# Patient Record
Sex: Female | Born: 1960 | Race: Black or African American | Hispanic: No | Marital: Single | Smoking: Former smoker
Health system: Southern US, Community
[De-identification: ages and names within clinical notes are randomized; demographics above are authoritative.]

## PROBLEM LIST (undated history)

## (undated) ENCOUNTER — Ambulatory Visit (HOSPITAL_COMMUNITY): Admission: EM | Payer: No Typology Code available for payment source

## (undated) DIAGNOSIS — N3281 Overactive bladder: Secondary | ICD-10-CM

## (undated) DIAGNOSIS — I10 Essential (primary) hypertension: Secondary | ICD-10-CM

## (undated) DIAGNOSIS — E119 Type 2 diabetes mellitus without complications: Secondary | ICD-10-CM

## (undated) DIAGNOSIS — M069 Rheumatoid arthritis, unspecified: Secondary | ICD-10-CM

## (undated) DIAGNOSIS — K219 Gastro-esophageal reflux disease without esophagitis: Secondary | ICD-10-CM

## (undated) DIAGNOSIS — K5900 Constipation, unspecified: Secondary | ICD-10-CM

## (undated) DIAGNOSIS — G5603 Carpal tunnel syndrome, bilateral upper limbs: Secondary | ICD-10-CM

## (undated) DIAGNOSIS — E7800 Pure hypercholesterolemia, unspecified: Secondary | ICD-10-CM

## (undated) HISTORY — PX: ABDOMINAL HYSTERECTOMY: SHX81

## (undated) HISTORY — DX: Type 2 diabetes mellitus without complications: E11.9

## (undated) HISTORY — DX: Rheumatoid arthritis, unspecified: M06.9

## (undated) HISTORY — DX: Carpal tunnel syndrome, bilateral upper limbs: G56.03

## (undated) HISTORY — PX: ANKLE FRACTURE SURGERY: SHX122

---

## 1997-08-12 ENCOUNTER — Emergency Department (HOSPITAL_COMMUNITY): Admission: EM | Admit: 1997-08-12 | Discharge: 1997-08-12 | Payer: Self-pay | Admitting: Emergency Medicine

## 1997-08-13 ENCOUNTER — Emergency Department (HOSPITAL_COMMUNITY): Admission: EM | Admit: 1997-08-13 | Discharge: 1997-08-13 | Payer: Self-pay | Admitting: Internal Medicine

## 1997-09-27 ENCOUNTER — Emergency Department (HOSPITAL_COMMUNITY): Admission: EM | Admit: 1997-09-27 | Discharge: 1997-09-27 | Payer: Self-pay | Admitting: Emergency Medicine

## 1997-10-29 ENCOUNTER — Emergency Department (HOSPITAL_COMMUNITY): Admission: EM | Admit: 1997-10-29 | Discharge: 1997-10-29 | Payer: Self-pay | Admitting: Emergency Medicine

## 1998-11-20 ENCOUNTER — Emergency Department (HOSPITAL_COMMUNITY): Admission: EM | Admit: 1998-11-20 | Discharge: 1998-11-20 | Payer: Self-pay | Admitting: Emergency Medicine

## 1999-01-18 ENCOUNTER — Emergency Department (HOSPITAL_COMMUNITY): Admission: EM | Admit: 1999-01-18 | Discharge: 1999-01-18 | Payer: Self-pay | Admitting: Emergency Medicine

## 1999-02-20 ENCOUNTER — Ambulatory Visit (HOSPITAL_COMMUNITY): Admission: RE | Admit: 1999-02-20 | Discharge: 1999-02-20 | Payer: Self-pay | Admitting: Internal Medicine

## 1999-02-20 ENCOUNTER — Encounter: Payer: Self-pay | Admitting: Internal Medicine

## 1999-09-29 ENCOUNTER — Emergency Department (HOSPITAL_COMMUNITY): Admission: EM | Admit: 1999-09-29 | Discharge: 1999-09-29 | Payer: Self-pay | Admitting: Emergency Medicine

## 1999-12-22 ENCOUNTER — Emergency Department (HOSPITAL_COMMUNITY): Admission: EM | Admit: 1999-12-22 | Discharge: 1999-12-22 | Payer: Self-pay | Admitting: Emergency Medicine

## 2000-01-06 ENCOUNTER — Ambulatory Visit (HOSPITAL_COMMUNITY): Admission: RE | Admit: 2000-01-06 | Discharge: 2000-01-06 | Payer: Self-pay | Admitting: *Deleted

## 2000-01-26 ENCOUNTER — Ambulatory Visit (HOSPITAL_COMMUNITY): Admission: RE | Admit: 2000-01-26 | Discharge: 2000-01-26 | Payer: Self-pay | Admitting: *Deleted

## 2000-05-18 ENCOUNTER — Emergency Department (HOSPITAL_COMMUNITY): Admission: EM | Admit: 2000-05-18 | Discharge: 2000-05-18 | Payer: Self-pay | Admitting: Emergency Medicine

## 2000-06-14 ENCOUNTER — Emergency Department (HOSPITAL_COMMUNITY): Admission: EM | Admit: 2000-06-14 | Discharge: 2000-06-14 | Payer: Self-pay | Admitting: Emergency Medicine

## 2002-02-04 ENCOUNTER — Emergency Department (HOSPITAL_COMMUNITY): Admission: EM | Admit: 2002-02-04 | Discharge: 2002-02-04 | Payer: Self-pay | Admitting: Emergency Medicine

## 2002-06-19 ENCOUNTER — Emergency Department (HOSPITAL_COMMUNITY): Admission: EM | Admit: 2002-06-19 | Discharge: 2002-06-19 | Payer: Self-pay

## 2002-09-24 ENCOUNTER — Emergency Department (HOSPITAL_COMMUNITY): Admission: EM | Admit: 2002-09-24 | Discharge: 2002-09-24 | Payer: Self-pay | Admitting: Emergency Medicine

## 2004-03-08 DIAGNOSIS — E8941 Symptomatic postprocedural ovarian failure: Secondary | ICD-10-CM

## 2004-03-08 HISTORY — DX: Symptomatic postprocedural ovarian failure: E89.41

## 2004-05-18 ENCOUNTER — Ambulatory Visit: Payer: Self-pay | Admitting: Nurse Practitioner

## 2004-05-19 ENCOUNTER — Ambulatory Visit: Payer: Self-pay | Admitting: *Deleted

## 2004-06-01 ENCOUNTER — Ambulatory Visit: Payer: Self-pay | Admitting: Nurse Practitioner

## 2004-06-04 ENCOUNTER — Ambulatory Visit (HOSPITAL_COMMUNITY): Admission: RE | Admit: 2004-06-04 | Discharge: 2004-06-04 | Payer: Self-pay | Admitting: Internal Medicine

## 2004-06-04 ENCOUNTER — Encounter: Payer: Self-pay | Admitting: Cardiology

## 2004-06-04 ENCOUNTER — Ambulatory Visit: Payer: Self-pay | Admitting: Internal Medicine

## 2004-06-05 ENCOUNTER — Ambulatory Visit (HOSPITAL_COMMUNITY): Admission: RE | Admit: 2004-06-05 | Discharge: 2004-06-05 | Payer: Self-pay | Admitting: Family Medicine

## 2004-06-08 ENCOUNTER — Encounter (HOSPITAL_COMMUNITY): Admission: RE | Admit: 2004-06-08 | Discharge: 2004-09-06 | Payer: Self-pay | Admitting: Nurse Practitioner

## 2004-06-12 ENCOUNTER — Ambulatory Visit: Payer: Self-pay | Admitting: Hematology & Oncology

## 2004-06-20 ENCOUNTER — Emergency Department (HOSPITAL_COMMUNITY): Admission: EM | Admit: 2004-06-20 | Discharge: 2004-06-20 | Payer: Self-pay | Admitting: Emergency Medicine

## 2004-06-22 ENCOUNTER — Ambulatory Visit: Payer: Self-pay | Admitting: Nurse Practitioner

## 2004-06-22 ENCOUNTER — Ambulatory Visit (HOSPITAL_COMMUNITY): Admission: RE | Admit: 2004-06-22 | Discharge: 2004-06-22 | Payer: Self-pay | Admitting: Internal Medicine

## 2004-08-11 ENCOUNTER — Ambulatory Visit: Payer: Self-pay | Admitting: Hematology & Oncology

## 2004-09-03 ENCOUNTER — Ambulatory Visit: Payer: Self-pay | Admitting: Nurse Practitioner

## 2004-09-26 ENCOUNTER — Emergency Department (HOSPITAL_COMMUNITY): Admission: EM | Admit: 2004-09-26 | Discharge: 2004-09-26 | Payer: Self-pay | Admitting: Emergency Medicine

## 2004-09-28 ENCOUNTER — Emergency Department (HOSPITAL_COMMUNITY): Admission: EM | Admit: 2004-09-28 | Discharge: 2004-09-28 | Payer: Self-pay | Admitting: Emergency Medicine

## 2004-10-03 ENCOUNTER — Emergency Department (HOSPITAL_COMMUNITY): Admission: EM | Admit: 2004-10-03 | Discharge: 2004-10-03 | Payer: Self-pay | Admitting: Emergency Medicine

## 2004-10-05 ENCOUNTER — Ambulatory Visit: Payer: Self-pay | Admitting: Family Medicine

## 2004-10-07 ENCOUNTER — Ambulatory Visit: Payer: Self-pay | Admitting: Family Medicine

## 2004-10-14 ENCOUNTER — Emergency Department (HOSPITAL_COMMUNITY): Admission: EM | Admit: 2004-10-14 | Discharge: 2004-10-14 | Payer: Self-pay | Admitting: Emergency Medicine

## 2004-10-15 ENCOUNTER — Ambulatory Visit: Payer: Self-pay | Admitting: Nurse Practitioner

## 2004-10-27 ENCOUNTER — Ambulatory Visit: Payer: Self-pay | Admitting: Obstetrics and Gynecology

## 2004-10-29 ENCOUNTER — Ambulatory Visit: Payer: Self-pay | Admitting: Nurse Practitioner

## 2004-11-03 ENCOUNTER — Encounter (INDEPENDENT_AMBULATORY_CARE_PROVIDER_SITE_OTHER): Payer: Self-pay | Admitting: Specialist

## 2004-11-03 ENCOUNTER — Other Ambulatory Visit: Admission: RE | Admit: 2004-11-03 | Discharge: 2004-11-03 | Payer: Self-pay | Admitting: Obstetrics and Gynecology

## 2004-11-03 ENCOUNTER — Ambulatory Visit: Payer: Self-pay | Admitting: *Deleted

## 2004-11-11 ENCOUNTER — Ambulatory Visit: Payer: Self-pay | Admitting: Hematology & Oncology

## 2004-11-24 ENCOUNTER — Ambulatory Visit: Payer: Self-pay | Admitting: Obstetrics & Gynecology

## 2005-01-18 ENCOUNTER — Encounter (INDEPENDENT_AMBULATORY_CARE_PROVIDER_SITE_OTHER): Payer: Self-pay | Admitting: *Deleted

## 2005-01-18 ENCOUNTER — Inpatient Hospital Stay (HOSPITAL_COMMUNITY): Admission: RE | Admit: 2005-01-18 | Discharge: 2005-01-20 | Payer: Self-pay | Admitting: Obstetrics & Gynecology

## 2005-01-18 ENCOUNTER — Ambulatory Visit: Payer: Self-pay | Admitting: Obstetrics & Gynecology

## 2005-01-26 ENCOUNTER — Ambulatory Visit: Payer: Self-pay | Admitting: Obstetrics & Gynecology

## 2005-02-10 ENCOUNTER — Ambulatory Visit: Payer: Self-pay | Admitting: Hematology & Oncology

## 2005-02-16 ENCOUNTER — Ambulatory Visit: Payer: Self-pay | Admitting: Obstetrics & Gynecology

## 2005-02-25 ENCOUNTER — Ambulatory Visit: Payer: Self-pay | Admitting: Family Medicine

## 2005-02-28 ENCOUNTER — Emergency Department (HOSPITAL_COMMUNITY): Admission: EM | Admit: 2005-02-28 | Discharge: 2005-02-28 | Payer: Self-pay | Admitting: *Deleted

## 2005-04-01 ENCOUNTER — Ambulatory Visit: Payer: Self-pay | Admitting: Obstetrics and Gynecology

## 2005-04-12 ENCOUNTER — Emergency Department (HOSPITAL_COMMUNITY): Admission: EM | Admit: 2005-04-12 | Discharge: 2005-04-12 | Payer: Self-pay | Admitting: Emergency Medicine

## 2005-05-05 ENCOUNTER — Ambulatory Visit: Payer: Self-pay | Admitting: Nurse Practitioner

## 2005-05-07 ENCOUNTER — Ambulatory Visit (HOSPITAL_COMMUNITY): Admission: RE | Admit: 2005-05-07 | Discharge: 2005-05-07 | Payer: Self-pay | Admitting: Internal Medicine

## 2005-05-25 ENCOUNTER — Ambulatory Visit: Payer: Self-pay | Admitting: Hematology & Oncology

## 2005-06-03 ENCOUNTER — Ambulatory Visit: Payer: Self-pay | Admitting: *Deleted

## 2005-06-17 ENCOUNTER — Ambulatory Visit: Payer: Self-pay | Admitting: Obstetrics and Gynecology

## 2005-07-23 ENCOUNTER — Ambulatory Visit: Payer: Self-pay | Admitting: Nurse Practitioner

## 2005-09-06 ENCOUNTER — Ambulatory Visit: Payer: Self-pay | Admitting: Nurse Practitioner

## 2005-11-25 ENCOUNTER — Ambulatory Visit: Payer: Self-pay | Admitting: Hematology & Oncology

## 2005-12-23 ENCOUNTER — Observation Stay (HOSPITAL_COMMUNITY): Admission: EM | Admit: 2005-12-23 | Discharge: 2005-12-23 | Payer: Self-pay | Admitting: Emergency Medicine

## 2006-05-02 ENCOUNTER — Ambulatory Visit: Payer: Self-pay | Admitting: Nurse Practitioner

## 2006-05-13 ENCOUNTER — Encounter: Admission: RE | Admit: 2006-05-13 | Discharge: 2006-05-13 | Payer: Self-pay | Admitting: Nurse Practitioner

## 2006-05-16 ENCOUNTER — Emergency Department (HOSPITAL_COMMUNITY): Admission: EM | Admit: 2006-05-16 | Discharge: 2006-05-16 | Payer: Self-pay | Admitting: Emergency Medicine

## 2006-08-11 ENCOUNTER — Ambulatory Visit: Payer: Self-pay | Admitting: Internal Medicine

## 2006-08-19 ENCOUNTER — Ambulatory Visit: Payer: Self-pay | Admitting: Internal Medicine

## 2006-11-23 ENCOUNTER — Encounter (INDEPENDENT_AMBULATORY_CARE_PROVIDER_SITE_OTHER): Payer: Self-pay | Admitting: *Deleted

## 2006-12-25 ENCOUNTER — Emergency Department (HOSPITAL_COMMUNITY): Admission: EM | Admit: 2006-12-25 | Discharge: 2006-12-25 | Payer: Self-pay | Admitting: Emergency Medicine

## 2007-01-31 ENCOUNTER — Encounter (INDEPENDENT_AMBULATORY_CARE_PROVIDER_SITE_OTHER): Payer: Self-pay | Admitting: Nurse Practitioner

## 2007-01-31 ENCOUNTER — Ambulatory Visit: Payer: Self-pay | Admitting: Family Medicine

## 2007-01-31 LAB — CONVERTED CEMR LAB
AST: 22 units/L (ref 0–37)
Albumin: 4.7 g/dL (ref 3.5–5.2)
Alkaline Phosphatase: 63 units/L (ref 39–117)
Basophils Relative: 0 % (ref 0–1)
Eosinophils Absolute: 0.1 10*3/uL — ABNORMAL LOW (ref 0.2–0.7)
LDL Cholesterol: 99 mg/dL (ref 0–99)
MCHC: 33.6 g/dL (ref 30.0–36.0)
MCV: 89.1 fL (ref 78.0–100.0)
Neutrophils Relative %: 67 % (ref 43–77)
Platelets: 295 10*3/uL (ref 150–400)
Potassium: 4.3 meq/L (ref 3.5–5.3)
Sodium: 141 meq/L (ref 135–145)
TSH: 1.035 microintl units/mL (ref 0.350–5.50)
Total Protein: 8.1 g/dL (ref 6.0–8.3)
WBC: 6.2 10*3/uL (ref 4.0–10.5)

## 2007-04-16 ENCOUNTER — Emergency Department (HOSPITAL_COMMUNITY): Admission: EM | Admit: 2007-04-16 | Discharge: 2007-04-16 | Payer: Self-pay | Admitting: Emergency Medicine

## 2007-04-21 ENCOUNTER — Ambulatory Visit: Payer: Self-pay | Admitting: Internal Medicine

## 2007-05-26 ENCOUNTER — Ambulatory Visit: Payer: Self-pay | Admitting: Family Medicine

## 2007-08-12 ENCOUNTER — Emergency Department (HOSPITAL_COMMUNITY): Admission: EM | Admit: 2007-08-12 | Discharge: 2007-08-12 | Payer: Self-pay | Admitting: Emergency Medicine

## 2007-08-31 ENCOUNTER — Ambulatory Visit: Payer: Self-pay | Admitting: Internal Medicine

## 2007-09-15 ENCOUNTER — Ambulatory Visit: Payer: Self-pay | Admitting: Internal Medicine

## 2007-10-27 ENCOUNTER — Ambulatory Visit: Payer: Self-pay | Admitting: Internal Medicine

## 2007-10-27 ENCOUNTER — Encounter: Payer: Self-pay | Admitting: Family Medicine

## 2007-11-02 ENCOUNTER — Ambulatory Visit (HOSPITAL_COMMUNITY): Admission: RE | Admit: 2007-11-02 | Discharge: 2007-11-02 | Payer: Self-pay | Admitting: Family Medicine

## 2007-11-22 ENCOUNTER — Ambulatory Visit: Payer: Self-pay | Admitting: Internal Medicine

## 2007-12-05 ENCOUNTER — Ambulatory Visit (HOSPITAL_COMMUNITY): Admission: RE | Admit: 2007-12-05 | Discharge: 2007-12-05 | Payer: Self-pay | Admitting: Internal Medicine

## 2007-12-29 ENCOUNTER — Ambulatory Visit: Payer: Self-pay | Admitting: Family Medicine

## 2008-01-01 ENCOUNTER — Emergency Department (HOSPITAL_COMMUNITY): Admission: EM | Admit: 2008-01-01 | Discharge: 2008-01-01 | Payer: Self-pay | Admitting: Emergency Medicine

## 2008-01-03 ENCOUNTER — Ambulatory Visit: Payer: Self-pay | Admitting: Internal Medicine

## 2008-01-26 ENCOUNTER — Ambulatory Visit: Payer: Self-pay | Admitting: Internal Medicine

## 2008-02-08 ENCOUNTER — Encounter: Payer: Self-pay | Admitting: Family Medicine

## 2008-02-08 ENCOUNTER — Ambulatory Visit: Payer: Self-pay | Admitting: Internal Medicine

## 2008-02-08 LAB — CONVERTED CEMR LAB
Chloride: 102 meq/L (ref 96–112)
Creatinine, Ser: 0.79 mg/dL (ref 0.40–1.20)
Potassium: 3.8 meq/L (ref 3.5–5.3)

## 2008-02-19 ENCOUNTER — Ambulatory Visit: Payer: Self-pay | Admitting: Internal Medicine

## 2008-03-15 ENCOUNTER — Emergency Department (HOSPITAL_COMMUNITY): Admission: EM | Admit: 2008-03-15 | Discharge: 2008-03-15 | Payer: Self-pay | Admitting: Emergency Medicine

## 2008-03-20 ENCOUNTER — Emergency Department (HOSPITAL_COMMUNITY): Admission: EM | Admit: 2008-03-20 | Discharge: 2008-03-20 | Payer: Self-pay | Admitting: Family Medicine

## 2008-04-30 ENCOUNTER — Emergency Department (HOSPITAL_COMMUNITY): Admission: EM | Admit: 2008-04-30 | Discharge: 2008-04-30 | Payer: Self-pay | Admitting: Emergency Medicine

## 2008-05-03 ENCOUNTER — Ambulatory Visit: Payer: Self-pay | Admitting: Family Medicine

## 2008-05-03 LAB — CONVERTED CEMR LAB
AST: 31 units/L (ref 0–37)
BUN: 11 mg/dL (ref 6–23)
Basophils Relative: 0 % (ref 0–1)
Calcium: 9.5 mg/dL (ref 8.4–10.5)
Chloride: 105 meq/L (ref 96–112)
Creatinine, Ser: 0.75 mg/dL (ref 0.40–1.20)
Glucose, Bld: 86 mg/dL (ref 70–99)
Hemoglobin: 14 g/dL (ref 12.0–15.0)
Lymphs Abs: 1.4 10*3/uL (ref 0.7–4.0)
MCHC: 34 g/dL (ref 30.0–36.0)
MCV: 90.2 fL (ref 78.0–100.0)
Monocytes Absolute: 0.3 10*3/uL (ref 0.1–1.0)
Monocytes Relative: 6 % (ref 3–12)
Neutro Abs: 3.1 10*3/uL (ref 1.7–7.7)
RBC: 4.57 M/uL (ref 3.87–5.11)
TSH: 0.476 microintl units/mL (ref 0.350–4.50)

## 2008-05-06 ENCOUNTER — Ambulatory Visit (HOSPITAL_COMMUNITY): Admission: RE | Admit: 2008-05-06 | Discharge: 2008-05-06 | Payer: Self-pay | Admitting: Family Medicine

## 2008-05-17 ENCOUNTER — Ambulatory Visit: Payer: Self-pay | Admitting: Family Medicine

## 2008-05-21 ENCOUNTER — Encounter: Payer: Self-pay | Admitting: Family Medicine

## 2009-01-10 ENCOUNTER — Ambulatory Visit (HOSPITAL_COMMUNITY): Admission: RE | Admit: 2009-01-10 | Discharge: 2009-01-10 | Payer: Self-pay | Admitting: Internal Medicine

## 2009-02-19 ENCOUNTER — Ambulatory Visit: Payer: Self-pay | Admitting: Nurse Practitioner

## 2009-02-19 DIAGNOSIS — K219 Gastro-esophageal reflux disease without esophagitis: Secondary | ICD-10-CM | POA: Insufficient documentation

## 2009-02-19 DIAGNOSIS — R350 Frequency of micturition: Secondary | ICD-10-CM | POA: Insufficient documentation

## 2009-02-19 DIAGNOSIS — I10 Essential (primary) hypertension: Secondary | ICD-10-CM | POA: Insufficient documentation

## 2009-02-19 DIAGNOSIS — L293 Anogenital pruritus, unspecified: Secondary | ICD-10-CM | POA: Insufficient documentation

## 2009-02-19 HISTORY — DX: Frequency of micturition: R35.0

## 2009-02-19 LAB — CONVERTED CEMR LAB
ALT: 12 units/L (ref 0–35)
Alkaline Phosphatase: 46 units/L (ref 39–117)
Basophils Absolute: 0 10*3/uL (ref 0.0–0.1)
Basophils Relative: 0 % (ref 0–1)
Blood Glucose, Fingerstick: 85
CO2: 22 meq/L (ref 19–32)
Creatinine, Ser: 0.97 mg/dL (ref 0.40–1.20)
Eosinophils Absolute: 0.1 10*3/uL (ref 0.0–0.7)
Glucose, Bld: 97 mg/dL (ref 70–99)
Hgb A1c MFr Bld: 5.6 %
MCHC: 34.4 g/dL (ref 30.0–36.0)
MCV: 89.2 fL (ref 78.0–100.0)
Neutro Abs: 4.1 10*3/uL (ref 1.7–7.7)
Neutrophils Relative %: 70 % (ref 43–77)
Nitrite: NEGATIVE
RDW: 13.7 % (ref 11.5–15.5)
Rapid HIV Screen: NEGATIVE
Sodium: 135 meq/L (ref 135–145)
Specific Gravity, Urine: 1.01
TSH: 0.599 microintl units/mL (ref 0.350–4.500)
Total Bilirubin: 0.4 mg/dL (ref 0.3–1.2)
Total Protein: 7.4 g/dL (ref 6.0–8.3)
Urobilinogen, UA: 0.2
WBC Urine, dipstick: NEGATIVE
pH: 7

## 2009-02-20 ENCOUNTER — Encounter (INDEPENDENT_AMBULATORY_CARE_PROVIDER_SITE_OTHER): Payer: Self-pay | Admitting: Nurse Practitioner

## 2009-02-21 DIAGNOSIS — D509 Iron deficiency anemia, unspecified: Secondary | ICD-10-CM | POA: Insufficient documentation

## 2009-03-11 ENCOUNTER — Ambulatory Visit: Payer: Self-pay | Admitting: Nurse Practitioner

## 2009-03-11 DIAGNOSIS — K5900 Constipation, unspecified: Secondary | ICD-10-CM | POA: Insufficient documentation

## 2009-03-11 LAB — CONVERTED CEMR LAB
Blood in Urine, dipstick: NEGATIVE
Chlamydia, DNA Probe: NEGATIVE
Cholesterol: 201 mg/dL — ABNORMAL HIGH (ref 0–200)
GC Probe Amp, Genital: NEGATIVE
Glucose, Urine, Semiquant: NEGATIVE
Ketones, urine, test strip: NEGATIVE
Microalb, Ur: 0.5 mg/dL (ref 0.00–1.89)
OCCULT 1: NEGATIVE
Protein, U semiquant: NEGATIVE
Total CHOL/HDL Ratio: 2.4
WBC Urine, dipstick: NEGATIVE
pH: 5

## 2009-03-14 ENCOUNTER — Encounter (INDEPENDENT_AMBULATORY_CARE_PROVIDER_SITE_OTHER): Payer: Self-pay | Admitting: Nurse Practitioner

## 2009-03-24 ENCOUNTER — Encounter (INDEPENDENT_AMBULATORY_CARE_PROVIDER_SITE_OTHER): Payer: Self-pay | Admitting: Nurse Practitioner

## 2009-04-01 ENCOUNTER — Telehealth (INDEPENDENT_AMBULATORY_CARE_PROVIDER_SITE_OTHER): Payer: Self-pay | Admitting: Nurse Practitioner

## 2009-10-16 ENCOUNTER — Ambulatory Visit: Payer: Self-pay | Admitting: Nurse Practitioner

## 2009-10-16 DIAGNOSIS — K6289 Other specified diseases of anus and rectum: Secondary | ICD-10-CM | POA: Insufficient documentation

## 2009-10-16 DIAGNOSIS — J309 Allergic rhinitis, unspecified: Secondary | ICD-10-CM | POA: Insufficient documentation

## 2009-11-20 ENCOUNTER — Ambulatory Visit: Payer: Self-pay | Admitting: Nurse Practitioner

## 2009-11-26 ENCOUNTER — Ambulatory Visit (HOSPITAL_COMMUNITY): Admission: RE | Admit: 2009-11-26 | Discharge: 2009-11-26 | Payer: Self-pay | Admitting: Internal Medicine

## 2009-11-26 DIAGNOSIS — K5730 Diverticulosis of large intestine without perforation or abscess without bleeding: Secondary | ICD-10-CM | POA: Insufficient documentation

## 2009-11-26 HISTORY — DX: Diverticulosis of large intestine without perforation or abscess without bleeding: K57.30

## 2009-12-01 ENCOUNTER — Encounter (INDEPENDENT_AMBULATORY_CARE_PROVIDER_SITE_OTHER): Payer: Self-pay | Admitting: Nurse Practitioner

## 2009-12-18 ENCOUNTER — Ambulatory Visit: Payer: Self-pay | Admitting: Nurse Practitioner

## 2010-01-12 ENCOUNTER — Ambulatory Visit (HOSPITAL_COMMUNITY)
Admission: RE | Admit: 2010-01-12 | Discharge: 2010-01-12 | Payer: Self-pay | Source: Home / Self Care | Admitting: Internal Medicine

## 2010-01-19 ENCOUNTER — Encounter (INDEPENDENT_AMBULATORY_CARE_PROVIDER_SITE_OTHER): Payer: Self-pay | Admitting: Nurse Practitioner

## 2010-01-28 ENCOUNTER — Encounter: Admission: RE | Admit: 2010-01-28 | Discharge: 2010-01-28 | Payer: Self-pay | Admitting: Internal Medicine

## 2010-02-01 ENCOUNTER — Emergency Department (HOSPITAL_COMMUNITY)
Admission: EM | Admit: 2010-02-01 | Discharge: 2010-02-01 | Payer: Self-pay | Source: Home / Self Care | Admitting: Family Medicine

## 2010-03-29 ENCOUNTER — Encounter: Payer: Self-pay | Admitting: Family Medicine

## 2010-04-07 NOTE — Progress Notes (Signed)
Summary: Medications Discontinue  Medications Added OXYBUTYNIN CHLORIDE 5 MG TABS (OXYBUTYNIN CHLORIDE) One tablet by mouth two times a day FAMOTIDINE 20 MG TABS (FAMOTIDINE) One tablet by mouth daily for stomach **Discontinue when protonix comes from PAP**       Phone Note Call from Patient Call back at [phone]   Summary of Call: According to the pt, [facility] told her that the protonix medication  from acid reflux is discontinue and she doesn't why.  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  Please call her back. [facility] FNP Initial call taken by: Manon Hilding,  April 01, 2009 8:15 AM  Follow-up for Phone Call        spoke with [facility] pharmacy) pt needs a rx new rx written for Protonix to send to Drug company and you can write on same rx famatodine to be replacement until protonix comes in..(????) pt also needs a rx for Oxybutynin last filled 01/03/09 Follow-up by: Mikey College CMA,  April 01, 2009 2:30 PM  Additional Follow-up for Phone Call Additional follow up Details #1::        meds printed to be faxed back Additional Follow-up by: Lehman Prom FNP,  April 01, 2009 2:41 PM    New/Updated Medications: OXYBUTYNIN CHLORIDE 5 MG TABS (OXYBUTYNIN CHLORIDE) One tablet by mouth two times a day FAMOTIDINE 20 MG TABS (FAMOTIDINE) One tablet by mouth daily for stomach **Discontinue when protonix comes from PAP** Prescriptions: OXYBUTYNIN CHLORIDE 5 MG TABS (OXYBUTYNIN CHLORIDE) One tablet by mouth two times a day  #60 x 5   Entered and Authorized by:   Lehman Prom FNP   Signed by:   Lehman Prom FNP on 04/01/2009   Method used:   Printed then faxed to ...       [facility] (retail)       [address] [place], [place]  [address]       Ph: [phone]       Fax: [phone]   RxID:   [id number] FAMOTIDINE 20 MG TABS (FAMOTIDINE) One tablet by mouth daily for stomach  **Discontinue when protonix comes from PAP**  #30 x 3   Entered and Authorized by:   Lehman Prom FNP   Signed by:   Lehman Prom FNP on 04/01/2009   Method used:   Printed then faxed to ...       [facility] (retail)       [address] [place], [place]  [address]       Ph: [phone]       Fax: [phone]   RxID:   [id number]

## 2010-04-07 NOTE — Progress Notes (Signed)
Summary: Office Visit//DEPRESSION SCREENING  Office Visit//DEPRESSION SCREENING   Imported By: Arta Bruce 05/22/2009 12:35:26  _____________________________________________________________________  External Attachment:    Type:   Image     Comment:   External Document

## 2010-04-07 NOTE — Assessment & Plan Note (Signed)
Summary: Complete Physical Exam   Vital Signs:  Patient profile:   50 year old female Menstrual status:  hysterectomy Weight:      199.2 pounds BMI:     30.18 BSA:     2.05 Temp:     98.0 degrees F oral Pulse rate:   64 / minute Pulse rhythm:   regular Resp:     16 per minute BP sitting:   133 / 90  (left arm) Cuff size:   regular  Vitals Entered By: Levon Hedger (March 11, 2009 10:48 AM) CC: CPP...pt states she needs to pick up meds she has been out for a few days, Abdominal Pain, Hypertension Management Is Patient Diabetic? No  Does patient need assistance? Functional Status Self care Ambulation Normal   CC:  CPP...pt states she needs to pick up meds she has been out for a few days, Abdominal Pain, and Hypertension Management.  History of Present Illness:  Pt into the office for Complete Physical Exam.  PAP - s/p hysterectomy  No family hx of cervical cancer or ovarian cancer  mammogram - done on 01/10/2009 No family hx of breast cancer.  Optho - no recent eye exams but admits that she does have some problems when reading.    Dental - Pt has not had a dental appointment in several years.  No current problems with  her teeth  Dyspepsia History:      She has no alarm features of dyspepsia including no history of melena, hematochezia, dysphagia, persistent vomiting, or involuntary weight loss > 5%.  There is a prior history of GERD.  The patient does not have a prior history of documented ulcer disease.  The dominant symptom is heartburn or acid reflux.  An H-2 blocker medication is not currently being taken.  She has no history of a positive H. Pylori serology.  No previous upper endoscopy has been done.    Hypertension History:      She denies headache, chest pain, and palpitations.  She notes no problems with any antihypertensive medication side effects.        Positive major cardiovascular risk factors include hypertension and current tobacco user.  Negative  major cardiovascular risk factors include female age less than 25 years old and no history of diabetes.      Habits & Providers  Alcohol-Tobacco-Diet     Alcohol drinks/day: <1     Alcohol Counseling: to decrease amount and/or frequency of alcohol intake     Alcohol type: beer     Tobacco Status: current     Tobacco Counseling: to quit use of tobacco products     Cigarette Packs/Day: <0.25  Exercise-Depression-Behavior     Does Patient Exercise: no     Have you felt down or hopeless? no     Have you felt little pleasure in things? no     Depression Counseling: not indicated; screening negative for depression     Drug Use: never  Allergies (verified): No Known Drug Allergies  Social History: Does Patient Exercise:  no  Review of Systems General:  Denies fever. Eyes:  Complains of blurring. ENT:  Denies earache. CV:  Denies chest pain or discomfort. Resp:  Denies cough. GI:  Complains of constipation; denies abdominal pain, nausea, and vomiting; pt has complaints of constipation.. GU:  Denies discharge. MS:  Complains of low back pain. Derm:  Denies rash. Neuro:  Denies headaches. Psych:  Denies anxiety and depression. Heme:  Denies bleeding.  Physical  Exam  General:  alert.   Head:  normocephalic.   scar on left side of the face Eyes:  vision grossly intact, pupils equal, and pupils round.   Ears:  R ear normal and L ear normal.   mimimal cerumen Nose:  no external deformity.   Mouth:  fair dentition and poor dentition.   Neck:  supple.   Breasts:  skin/areolae normal, no masses, and no abnormal thickening.   Lungs:  normal breath sounds.   Heart:  normal rate and regular rhythm.   Abdomen:  soft, non-tender, and normal bowel sounds.   Rectal:  no external abnormalities.   Msk:  normal ROM.   Pulses:  R radial normal, R dorsalis pedis normal, L radial normal, and L dorsalis pedis normal.   Extremities:  no edema Neurologic:  alert & oriented X3 and gait  normal.   Skin:  left upper thigh - skin tag, flesh color Psych:  Oriented X3.    Pelvic Exam  Vulva:      normal appearance.   Urethra and Bladder:      Urethra--normal.  Bladder--normal.   Vagina:      normal.   Cervix:      absent Adnexa:      nontender bilaterally.   Rectum:      heme negative stool.      Impression & Recommendations:  Problem # 1:  ROUTINE GYNECOLOGICAL EXAMINATION (ICD-V72.31) PAP done lipids done guaiac negative PHQ-9 score EKG done mammogram already done Orders: T-Lipid Profile [phone]) T- GC Chlamydia ([address]) KOH/ WET Mount [id number]) Pap Smear, Thin Prep ( Collection of) (G9562) Hemoccult Guaiac-1 spec.(in office) (82270)  Problem # 2:  HYPERTENSION, BENIGN ESSENTIAL (ICD-401.1) DASH diet advised pt to get her meds filled Her updated medication list for this problem includes:    Triamterene-hctz 37.5-25 Mg Tabs (Triamterene-hctz) ..... One tablet by mouth daily for blood pressure  Orders: T-Lipid Profile ([id number]) UA Dipstick w/o Micro (manual) (96295) T-Urine Microalbumin w/creat. ratio [phone])  Problem # 3:  CONSTIPATION (ICD-564.00) need to add fiber to her diet  Complete Medication List: 1)  Oxybutynin Chloride 5 Mg Tabs (Oxybutynin chloride) .... Take one tablet twice daily *dr. Tressia Danas 2)  Triamterene-hctz 37.5-25 Mg Tabs (Triamterene-hctz) .... One tablet by mouth daily for blood pressure 3)  Protonix 40 Mg Tbec (Pantoprazole sodium) .... One tablet by mouth daily before breakfast 4)  Allegra 180 Mg Tabs (Fexofenadine hcl) .... One tablet by mouth daily for allergies 5)  Ibuprofen 800 Mg Tabs (Ibuprofen) .... One tablet by mouth two times a day as needed for back pain  Hypertension Assessment/Plan:      The patient's hypertensive risk group is category B: At least one risk factor (excluding diabetes) with no target organ damage.  Her calculated 10 year risk of coronary heart disease is 4 %.  Today's  blood pressure is 133/90.  Her blood pressure goal is < 140/90.  Patient Instructions: 1)  You will need to schedule an appointment to get your eyes checked.   2)  Your cholesterol was checked today and you will be notifed of the results. 3)  You will need to add fiber to your diet.  You can also take the metamucil daily mixed with 8oz of water or juice. 4)  Follow up as needed Prescriptions: IBUPROFEN 800 MG TABS (IBUPROFEN) One tablet by mouth two times a day as needed for back pain  #50 x 1   Entered and Authorized by:  Lehman Prom FNP   Signed by:   Lehman Prom FNP on 03/11/2009   Method used:   Faxed to ...       [facility] (retail)       [address] [place], [place]  [address]       Ph: [phone]       Fax: [phone]   RxID:   [id number]   Prevention & Chronic Care Immunizations   Influenza vaccine: Refused  (02/19/2009)    Tetanus booster: 03/08/2004: historical    Pneumococcal vaccine: Not documented  Other Screening   Pap smear: normal  (03/08/2004)   Pap smear action/deferral: Ordered  (03/11/2009)   Pap smear due: 03/12/2011    Mammogram: ASSESSMENT: Negative - BI-RADS 1^MM DIGITAL SCREENING  (01/10/2009)   Smoking status: current  (03/11/2009)  Lipids   Total Cholesterol: 205  (01/31/2007)   LDL: 99  (01/31/2007)   LDL Direct: Not documented   HDL: 71  (01/31/2007)   Triglycerides: 173  (01/31/2007)  Hypertension   Last Blood Pressure: 133 / 90  (03/11/2009)   Serum creatinine: 0.97  (02/19/2009)   Serum potassium 4.5  (02/19/2009)  Self-Management Support :    Hypertension self-management support: Not documented   Laboratory Results   Urine Tests  Date/Time Received: March 11, 2009 12:14 PM   Routine Urinalysis   Color: lt. yellow Glucose: negative   (Normal Range: Negative) Bilirubin: negative   (Normal Range: Negative) Ketone: negative   (Normal Range:  Negative) Spec. Gravity: 1.025   (Normal Range: 1.003-1.035) Blood: negative   (Normal Range: Negative) pH: 5.0   (Normal Range: 5.0-8.0) Protein: negative   (Normal Range: Negative) Urobilinogen: 0.2   (Normal Range: 0-1) Nitrite: negative   (Normal Range: Negative) Leukocyte Esterace: negative   (Normal Range: Negative)      Wet Mount Source: vaginal WBC/hpf: 1-5 Bacteria/hpf: rare Clue cells/hpf: none Yeast/hpf: none Wet Mount KOH: Negative Trichomonas/hpf: none  Stool - Occult Blood Hemmoccult #1: negative Date: 03/12/2009

## 2010-04-07 NOTE — Letter (Signed)
Summary: *HSN Results Follow up  [facility]  [address] [place], [place] [address]   Phone: [phone]  Fax: [phone]      12/01/2009   [name] [address] [place], [place]  [address]   Dear  Ms. [name],                            ____S.Drinkard,FNP   ____D. Gore,FNP       ____B. McPherson,MD   ____V. Rankins,MD    ____E. Mulberry,MD    __X__N. Daphine Deutscher, FNP  ____D. Reche Dixon, MD    ____K. Philipp Deputy, MD    ____Other     This letter is to inform you that your recent test(s):  Barium Enema    ___X____ is within acceptable limits  _______ requires a medication change  _______ requires a follow-up lab visit  _______ requires a follow-up visit with your [name]   Comments:  Barium enema results are normal.       _________________________________________________________ If you have any questions, please contact our office [phone].                    Sincerely,    Lehman Prom FNP [facility]

## 2010-04-07 NOTE — Assessment & Plan Note (Signed)
Summary: Rectal Pain/Allergic Rhinitis   Vital Signs:  Patient profile:   50 year old female Menstrual status:  hysterectomy Weight:      208.8 pounds Temp:     97.9 degrees F oral Pulse rate:   76 / minute Pulse rhythm:   regular Resp:     20 per minute BP sitting:   122 / 80  (left arm) Cuff size:   regular  Vitals Entered By: Levon Hedger (November 20, 2009 3:20 PM) CC: Hypertension Management, Abdominal Pain Is Patient Diabetic? No Pain Assessment Patient in pain? no       Does patient need assistance? Functional Status Self care Ambulation Normal   CC:  Hypertension Management and Abdominal Pain.  History of Present Illness:  Pt into the office for f/u on rectal pain and allergies.  Rectal pain - Pt was started on miralax during her last visit.  Her stools are soft at this time and she is tolerating the miralax daily. No blood noted in stools.   No family history of colon cancer  Dyspepsia History:      She has no alarm features of dyspepsia including no history of melena, hematochezia, dysphagia, persistent vomiting, or involuntary weight loss > 5%.  There is a prior history of GERD.  The patient does not have a prior history of documented ulcer disease.  An H-2 blocker medication is not currently being taken.  No previous upper endoscopy has been done.    Hypertension History:      She denies headache, chest pain, and palpitations.  She notes no problems with any antihypertensive medication side effects.        Positive major cardiovascular risk factors include hypertension and current tobacco user.  Negative major cardiovascular risk factors include female age less than 19 years old and no history of diabetes.        Further assessment for target organ damage reveals no history of ASHD, cardiac end-organ damage (CHF/LVH), stroke/TIA, peripheral vascular disease, renal insufficiency, or hypertensive retinopathy.      Medications Prior to Update: 1)   Oxybutynin Chloride 5 Mg Tabs (Oxybutynin Chloride) .... One Tablet By Mouth Two Times A Day 2)  Triamterene-Hctz 37.5-25 Mg Tabs (Triamterene-Hctz) .... One Tablet By Mouth Daily For Blood Pressure 3)  Protonix 40 Mg Tbec (Pantoprazole Sodium) .... One Tablet By Mouth Daily Before Breakfast 4)  Loratadine 10 Mg Tabs (Loratadine) .... One Tablet By Mouth Daily For Allergies 5)  Ibuprofen 800 Mg Tabs (Ibuprofen) .... One Tablet By Mouth Two Times A Day As Needed For Back Pain 6)  Miralax  Powd (Polyethylene Glycol 3350) .... Mix 17gm With 8 Oz of Water or Juice Daily  Allergies (verified): No Known Drug Allergies  Review of Systems General:  Denies fever. ENT:  Complains of hoarseness and nasal congestion; Pt was started on loratadine during the last visit.  She reports that she had allegra before and may be willing to try it again and purchase over the counter.. CV:  Denies chest pain or discomfort. Resp:  Denies cough. GI:  Denies constipation; Pt was started on miralax during the last visit and she is taking as ordered.  Physical Exam  General:  alert.   Head:  normocephalic.   Eyes:  pupils round.   Ears:  bil TM with bony landmarks present Neck:  supple.   Lungs:  normal breath sounds.   Heart:  normal rate and regular rhythm.   Abdomen:  normal bowel sounds.  Msk:  normal ROM.   Neurologic:  alert & oriented X3.   Skin:  color normal.   Psych:  Oriented X3.     Impression & Recommendations:  Problem # 1:  HYPERTENSION, BENIGN ESSENTIAL (ICD-401.1) BP is doing well. DASH diet. Her updated medication list for this problem includes:    Triamterene-hctz 37.5-25 Mg Tabs (Triamterene-hctz) ..... One tablet by mouth daily for blood pressure  Problem # 2:  RECTAL PAIN (ZOX-096.04) will refer pt for a barium enema Orders: Barium Enema (BE)  Problem # 3:  ALLERGIC RHINITIS (ICD-477.9) pt to get allegra over the counter for 1 month - will send xyzal to [facility] pharmacy for pt to  check there The following medications were removed from the medication list:    Loratadine 10 Mg Tabs (Loratadine) ..... One tablet by mouth daily for allergies Her updated medication list for this problem includes:    Xyzal 5 Mg Tabs (Levocetirizine dihydrochloride) ..... One tablet by mouth daily for allergies  Problem # 4:  CONSTIPATION (ICD-564.00) imrproved with miralax Her updated medication list for this problem includes:    Miralax Powd (Polyethylene glycol 3350) ..... Mix 17gm with 8 oz of water or juice daily  Orders: Barium Enema (BE)  Complete Medication List: 1)  Oxybutynin Chloride 5 Mg Tabs (Oxybutynin chloride) .... One tablet by mouth two times a day 2)  Triamterene-hctz 37.5-25 Mg Tabs (Triamterene-hctz) .... One tablet by mouth daily for blood pressure 3)  Protonix 40 Mg Tbec (Pantoprazole sodium) .... One tablet by mouth daily before breakfast 4)  Ibuprofen 800 Mg Tabs (Ibuprofen) .... One tablet by mouth two times a day as needed for back pain 5)  Miralax Powd (Polyethylene glycol 3350) .... Mix 17gm with 8 oz of water or juice daily 6)  Xyzal 5 Mg Tabs (Levocetirizine dihydrochloride) .... One tablet by mouth daily for allergies  Hypertension Assessment/Plan:      The patient's hypertensive risk group is category B: At least one risk factor (excluding diabetes) with no target organ damage.  Her calculated 10 year risk of coronary heart disease is 3 %.  Today's blood pressure is 122/80.  Her blood pressure goal is < 140/90.  Patient Instructions: 1)  You will be notified of the appointment for the barium enema. 2)  Continue current medications as ordered Prescriptions: XYZAL 5 MG TABS (LEVOCETIRIZINE DIHYDROCHLORIDE) One tablet by mouth daily for allergies  #30 x 5   Entered and Authorized by:   Lehman Prom FNP   Signed by:   Lehman Prom FNP on 11/20/2009   Method used:   Faxed to ...       [facility] (retail)        [address] [place], [place]  [address]       Ph: [phone]       Fax: [phone]   RxID:   [id number]

## 2010-04-07 NOTE — Letter (Signed)
Summary: Lipid Letter  [facility]  [address] [place], [place] [address]   Phone: [phone]  Fax: [phone]    03/14/2009  [name] [address] [place], [place]  [address]  Dear [name]:  We have carefully reviewed your last lipid profile from 03/11/2009 and the results are noted below with a summary of recommendations for lipid management.    Cholesterol:       201     Goal: less than 200   HDL "good" Cholesterol:   85     Goal: greater than 40   LDL "bad" Cholesterol:   97     Goal: less than 130   Triglycerides:       93     Goal: less than 150    Your cholesterol was just 1 point higher than it should be.  NO need for medications. I just wanted you to be aware of the results.  Pap Smear normal.      Current Medications: 1)    Oxybutynin Chloride 5 Mg Tabs (Oxybutynin chloride) .... Take one tablet twice daily *dr. Tressia Danas 2)    Triamterene-hctz 37.5-25 Mg Tabs (Triamterene-hctz) .... One tablet by mouth daily for blood pressure 3)    Protonix 40 Mg Tbec (Pantoprazole sodium) .... One tablet by mouth daily before breakfast 4)    Allegra 180 Mg Tabs (Fexofenadine hcl) .... One tablet by mouth daily for allergies 5)    Ibuprofen 800 Mg Tabs (Ibuprofen) .... One tablet by mouth two times a day as needed for back pain  If you have any questions, please call. We appreciate being able to work with you.   Sincerely,    Lehman Prom, FNP [facility]

## 2010-04-07 NOTE — Assessment & Plan Note (Signed)
Summary: Acute - Rectal pain   Vital Signs:  Patient profile:   50 year old female Menstrual status:  hysterectomy Weight:      200.7 pounds BMI:     30.40 Temp:     97.9 degrees F oral Pulse rate:   62 / minute Pulse rhythm:   regular Resp:     16 per minute BP sitting:   136 / 85  (left arm) Cuff size:   regular  Vitals Entered By: Levon Hedger (October 16, 2009 11:21 AM) CC: sometimes very hard to have a bowel movement has been wiping blood from recturm...headaches may be sinus related, Abdominal Pain, Hypertension Management Is Patient Diabetic? No  Does patient need assistance? Functional Status Self care Ambulation Normal   CC:  sometimes very hard to have a bowel movement has been wiping blood from recturm...headaches may be sinus related, Abdominal Pain, and Hypertension Management.  History of Present Illness:  Pt into the office with c/o rectal pain BM at least daily but she does admits that she strains at times. Admits that she does see blood on the tissue when she wipes.  She was given some fiber supplements during last visit which did not seem to help. She first noticed about 2 years ago but now the sharp pains in her rectal area started 2 months ago and  is increasing. No weight loss  Dyspepsia History:      She has no alarm features of dyspepsia including no history of melena, hematochezia, dysphagia, persistent vomiting, or involuntary weight loss > 5%.  There is a prior history of GERD.  The patient does not have a prior history of documented ulcer disease.  The dominant symptom is heartburn or acid reflux.  An H-2 blocker medication is currently being taken.  She has no history of a positive H. Pylori serology.  No previous upper endoscopy has been done.    Hypertension History:      She complains of headache, but denies chest pain and palpitations.  pt did not take her blood pressure medications today.        Positive major cardiovascular risk factors  include hypertension and current tobacco user.  Negative major cardiovascular risk factors include female age less than 25 years old and no history of diabetes.      Habits & Providers  Alcohol-Tobacco-Diet     Alcohol drinks/day: <1     Alcohol Counseling: to decrease amount and/or frequency of alcohol intake     Alcohol type: beer     Tobacco Status: current     Tobacco Counseling: to quit use of tobacco products     Cigarette Packs/Day: <0.25  Exercise-Depression-Behavior     Does Patient Exercise: no     Depression Counseling: not indicated; screening negative for depression     Drug Use: never  Medications Prior to Update: 1)  Oxybutynin Chloride 5 Mg Tabs (Oxybutynin Chloride) .... One Tablet By Mouth Two Times A Day 2)  Triamterene-Hctz 37.5-25 Mg Tabs (Triamterene-Hctz) .... One Tablet By Mouth Daily For Blood Pressure 3)  Protonix 40 Mg Tbec (Pantoprazole Sodium) .... One Tablet By Mouth Daily Before Breakfast 4)  Allegra 180 Mg Tabs (Fexofenadine Hcl) .... One Tablet By Mouth Daily For Allergies 5)  Ibuprofen 800 Mg Tabs (Ibuprofen) .... One Tablet By Mouth Two Times A Day As Needed For Back Pain 6)  Famotidine 20 Mg Tabs (Famotidine) .... One Tablet By Mouth Daily For Stomach **discontinue When Protonix Comes From Pap**  Allergies (verified): No Known Drug Allergies  Review of Systems General:  Denies fever. ENT:  Complains of earache and nasal congestion; right ear pain. CV:  Denies chest pain or discomfort. Resp:  Denies cough. GI:  Complains of constipation; denies nausea and vomiting; rectal pain indigestion - pt has not been taking her protonix. reports that she has not been able to get it from the pharmacy. Neuro:  Complains of headaches. Psych:  Complains of anxiety and panic attacks.  Physical Exam  General:  alert.   Head:  normocephalic.   Ears:  bil TM with clear fluid - no erythema Nose:  inflammed turbinates Mouth:  fair dentition.   Neck:   supple.   Lungs:  normal breath sounds.   Heart:  normal rate and regular rhythm.   Msk:  up to the exam Neurologic:  gait normal.   Skin:  color normal.   Psych:  Oriented X3.     Impression & Recommendations:  Problem # 1:  RECTAL PAIN (ICD-569.42) advsied pt to start taking miralax to improve bowel habits. if no improvement will need referral to GI  Problem # 2:  GERD (ICD-530.81) pt previously taking protonix - advised her to submit forms for patient assistance The following medications were removed from the medication list:    Famotidine 20 Mg Tabs (Famotidine) ..... One tablet by mouth daily for stomach **discontinue when protonix comes from pap** Her updated medication list for this problem includes:    Protonix 40 Mg Tbec (Pantoprazole sodium) ..... One tablet by mouth daily before breakfast  Problem # 3:  ALLERGIC RHINITIS (ICD-477.9) pr previously taking allegra but has not in several weeks due to cost will change to loratadine Her updated medication list for this problem includes:    Loratadine 10 Mg Tabs (Loratadine) ..... One tablet by mouth daily for allergies  Problem # 4:  HYPERTENSION, BENIGN ESSENTIAL (ICD-401.1) BP slightly elevated today but pt has not taken meds will refill Her updated medication list for this problem includes:    Triamterene-hctz 37.5-25 Mg Tabs (Triamterene-hctz) ..... One tablet by mouth daily for blood pressure  Complete Medication List: 1)  Oxybutynin Chloride 5 Mg Tabs (Oxybutynin chloride) .... One tablet by mouth two times a day 2)  Triamterene-hctz 37.5-25 Mg Tabs (Triamterene-hctz) .... One tablet by mouth daily for blood pressure 3)  Protonix 40 Mg Tbec (Pantoprazole sodium) .... One tablet by mouth daily before breakfast 4)  Loratadine 10 Mg Tabs (Loratadine) .... One tablet by mouth daily for allergies 5)  Ibuprofen 800 Mg Tabs (Ibuprofen) .... One tablet by mouth two times a day as needed for back pain 6)  Miralax Powd  (Polyethylene glycol 3350) .... Mix 17gm with 8 oz of water or juice daily  Hypertension Assessment/Plan:      The patient's hypertensive risk group is category B: At least one risk factor (excluding diabetes) with no target organ damage.  Her calculated 10 year risk of coronary heart disease is 3 %.  Today's blood pressure is 136/85.  Her blood pressure goal is < 140/90.  Patient Instructions: 1)  Allergies - start the loratadine 10mg  by mouth daily for allergies 2)  Take tax forms to the pharmacy so you can get your medications. 3)  Rectal pain - start the miralax (powder medication) and take daily.  Mix with water or juice daily. 4)  Follow up in 2-3 weeks with n.martin,fnp for rectal pain and anxiety Prescriptions: MIRALAX  POWD (POLYETHYLENE GLYCOL 3350) mix  17gm with 8 oz of water or juice daily  #1 month qs x 1   Entered and Authorized by:   Lehman Prom FNP   Signed by:   Lehman Prom FNP on 10/16/2009   Method used:   Faxed to ...       [facility] (retail)       [address] [place], [place]  [address]       Ph: [phone]       Fax: [phone]   RxID:   [id number] LORATADINE 10 MG TABS (LORATADINE) One tablet by mouth daily for allergies  #30 x 5   Entered and Authorized by:   Lehman Prom FNP   Signed by:   Lehman Prom FNP on 10/16/2009   Method used:   Print then Give to Patient   RxID:   [id number] TRIAMTERENE-HCTZ 37.5-25 MG TABS (TRIAMTERENE-HCTZ) One tablet by mouth daily for blood pressure  #30 x 5   Entered and Authorized by:   Lehman Prom FNP   Signed by:   Lehman Prom FNP on 10/16/2009   Method used:   Print then Give to Patient   RxID:   [id number] PROTONIX 40 MG TBEC (PANTOPRAZOLE SODIUM) One tablet by mouth daily before breakfast  #30 x 5   Entered and Authorized by:   Lehman Prom FNP   Signed by:   Lehman Prom FNP on 10/16/2009   Method used:   Faxed to ...        [facility] (retail)       [address] [place], [place]  [address]       Ph: [phone]       Fax: [phone]   RxID:   [id number] OXYBUTYNIN CHLORIDE 5 MG TABS (OXYBUTYNIN CHLORIDE) One tablet by mouth two times a day  #60 x 5   Entered and Authorized by:   Lehman Prom FNP   Signed by:   Lehman Prom FNP on 10/16/2009   Method used:   Faxed to ...       [facility] (retail)       [address] [place], [place]  [address]       Ph: [phone]       Fax: [phone]   RxID:   [id number]

## 2010-04-23 ENCOUNTER — Inpatient Hospital Stay (INDEPENDENT_AMBULATORY_CARE_PROVIDER_SITE_OTHER)
Admission: RE | Admit: 2010-04-23 | Discharge: 2010-04-23 | Disposition: A | Discharge status: Home / Self Care | Payer: Self-pay | Source: Ambulatory Visit | Attending: Family Medicine | Admitting: Family Medicine

## 2010-04-23 DIAGNOSIS — M549 Dorsalgia, unspecified: Secondary | ICD-10-CM

## 2010-04-24 ENCOUNTER — Telehealth (INDEPENDENT_AMBULATORY_CARE_PROVIDER_SITE_OTHER): Payer: Self-pay | Admitting: Internal Medicine

## 2010-04-29 NOTE — Progress Notes (Signed)
  Phone Note From Pharmacy   Caller: Pricilla Summary of Call: Dr. Delrae Alfred -- Pricilla from [facility] pharmacy called and wants to know if you will "OK" to refill the Robaxin 500mg  and Ibuprofen 800 for pt. who went to [facility] urgent care for back pain. -- Per. Dr. Delrae Alfred "ok" to fill -- Called Pricilla and notified her. -- Hale Drone CMA  April 24, 2010 12:07 PM

## 2010-06-22 LAB — POCT URINALYSIS DIP (DEVICE)
Glucose, UA: NEGATIVE mg/dL
Nitrite: NEGATIVE
Protein, ur: NEGATIVE mg/dL
Urobilinogen, UA: 0.2 mg/dL (ref 0.0–1.0)

## 2010-06-23 LAB — URINALYSIS, ROUTINE W REFLEX MICROSCOPIC
Glucose, UA: NEGATIVE mg/dL
Ketones, ur: 15 mg/dL — AB
Protein, ur: NEGATIVE mg/dL
Urobilinogen, UA: 0.2 mg/dL (ref 0.0–1.0)

## 2010-06-23 LAB — DIFFERENTIAL
Basophils Absolute: 0 10*3/uL (ref 0.0–0.1)
Eosinophils Absolute: 0.1 10*3/uL (ref 0.0–0.7)
Eosinophils Relative: 1 % (ref 0–5)
Lymphocytes Relative: 24 % (ref 12–46)
Lymphs Abs: 1.5 10*3/uL (ref 0.7–4.0)
Neutrophils Relative %: 67 % (ref 43–77)

## 2010-06-23 LAB — CBC
HCT: 38.3 % (ref 36.0–46.0)
MCV: 90.4 fL (ref 78.0–100.0)
Platelets: 235 10*3/uL (ref 150–400)
RDW: 14.1 % (ref 11.5–15.5)
WBC: 6.1 10*3/uL (ref 4.0–10.5)

## 2010-06-23 LAB — POCT I-STAT, CHEM 8
HCT: 41 % (ref 36.0–46.0)
Hemoglobin: 13.9 g/dL (ref 12.0–15.0)
Potassium: 3.6 mEq/L (ref 3.5–5.1)
Sodium: 137 mEq/L (ref 135–145)
TCO2: 24 mmol/L (ref 0–100)

## 2010-07-24 ENCOUNTER — Inpatient Hospital Stay (INDEPENDENT_AMBULATORY_CARE_PROVIDER_SITE_OTHER)
Admission: RE | Admit: 2010-07-24 | Discharge: 2010-07-24 | Disposition: A | Discharge status: Home / Self Care | Payer: Self-pay | Source: Ambulatory Visit | Attending: Family Medicine | Admitting: Family Medicine

## 2010-07-24 DIAGNOSIS — M1990 Unspecified osteoarthritis, unspecified site: Secondary | ICD-10-CM

## 2010-07-24 DIAGNOSIS — M779 Enthesopathy, unspecified: Secondary | ICD-10-CM

## 2010-07-24 NOTE — Discharge Summary (Signed)
[name], [name]                ACCOUNT NO.:  [id number]   MEDICAL RECORD NO.:  [id number]          PATIENT TYPE:  INP   LOCATION:  9319                          [facility]ACILITY:  [facility]   PHYSICIAN:  Lesly Dukes, M.D. DATE OF BIRTH:  01-16-1961   DATE OF ADMISSION:  01/18/2005  DATE OF DISCHARGE:  01/20/2005                                 DISCHARGE SUMMARY   HOSPITAL COURSE:  The patient is a 50 year old para 2-0-2-2, with  menorrhagia from fibroids, that required transfusion.  The patient had  approximately an 18-week size myomatous uterus with lots of pelvic pain.  The patient was offered Depo-Provera and uterine artery embolization as  alternative therapies; however, she denied both of these.  She is not a  candidate for OCP, since she is a smoker.  See details of her history and  physical on her admission physical dated January 18, 2005.   The patient underwent total abdominal hysterectomy and left salpingo-  oophorectomy on November 13.  She had a vertical skin incision secondary to  the large size of the uterus.  EBL for the procedure was 600, and the left  ovary had to be taken secondary to bleeding from the pedicle.  The patient  had a normal right ovary, although the ovary does appear small.  Postoperatively the patient ambulated, tolerated a regular diet and passed  flatus.  She never had any abnormal vital signs or fevers.   DISCHARGE INSTRUCTIONS:  No heavy lifting for six weeks.  No driving for two  weeks.  Nothing per vagina for six weeks.  Take temperature twice a day and  call if greater than 100.4.  Come to the emergency room with severe  abdominal pain, intractable nausea or vomiting.   DISCHARGE MEDICATIONS:  1.  Xanax, which the patient came on.  2.  Percocet.  3.  Iron.  4.  Docusate sodium.   FOLLOW-UP CARE:  Patient to return to Dr. Bertram Denver office on November 21  for staple removal.           ______________________________  Lesly Dukes,  M.D.     KHL/MEDQ  D:  01/21/2005  T:  01/21/2005  Job:  [id number]

## 2010-07-24 NOTE — Group Therapy Note (Signed)
[name], [name] NO.:  [id number]   MEDICAL RECORD NO.:  [id number]          PATIENT TYPE:  WOC   LOCATION:  [facility]                   FACILITY:  [facility]   PHYSICIAN:  Argentina Donovan, MD        DATE OF BIRTH:  Dec 02, 1960   DATE OF SERVICE:                                    CLINIC NOTE   The patient is a 50 year old black female, who underwent total abdominal  hysterectomy in November of 2006.  Since that time, she has had peri-  incisional pain with point tenderness approximately 1 cm down from the  superior point of the abdominal scar and to the left approximately 2 cm.  I  cannot demonstrate any hernia nor feel the knot of any suture.  However,  since a TDR suture was for abdominal closure, which takes longer to absorb,  I told the patient to come back in about two months, which will give her  four months from surgery.  An occult hernia certainly is a possibility,  along with a reactive neuroma.  In addition to this, the patient has had  urinary frequency especially with nocturia and the false urge following  voiding.  We are getting a urinalysis and culture, but I am going to start  the patient on Macrobid empirically and we will call her if there is any  change or a necessity to be followed up.   IMPRESSION:  Probable cystitis and surgical incisional pain.           ______________________________  Argentina Donovan, MD     PR/MEDQ  D:  04/01/2005  T:  04/01/2005  Job:  [id number]

## 2010-07-24 NOTE — Op Note (Signed)
[name], [name]                ACCOUNT NO.:  [id number]   MEDICAL RECORD NO.:  [id number]          PATIENT TYPE:  INP   LOCATION:  9399                          [facility]ACILITY:  [facility]   PHYSICIAN:  Lesly Dukes, M.D. DATE OF BIRTH:  02/20/1961   DATE OF PROCEDURE:  01/18/2005  DATE OF DISCHARGE:                                 OPERATIVE REPORT   PREOPERATIVE DIAGNOSES:  ___________.   PROCEDURE:   SURGEON:  Lesly Dukes, M.D.   ASSISTANTMichele Mcalpine D. Okey Dupre, M.D.   ANESTHESIA:  General.   PATHOLOGY:  Uterus, left ovary and fallopian tube.   ESTIMATED BLOOD LOSS:  600 mL.   COMPLICATIONS:  None.   DESCRIPTION OF PROCEDURE:  After informed consent was obtained, the patient  was taken to the operative field and general anesthesia was induced.  Patient positioned in dorsal lithotomy position, prepared and draped in  normal sterile fashion.  Foley in bladder.  Vertical skin incision was made  with the scalpel and carried down to the fascia ____________ superiorly and  inferiorly. Peritoneum was identified, entered sharply with Metzenbaum  scissors and the area cleared of bowel and bladder.  Incision was extended  superiorly and inferiorly with good visualization of the bowel and bladder.  Two large Kellys placed on the cornua and the uterus pulled into the  surgical field.  Each round ligament was clamped, transected and suture  ligated with 0 Vicryl.  Good hemostasis was noted.  The broad ligament was  incised and the bladder ___________ created.  Using sharp dissection, year  the utero-ovarian ligament was isolated, doubly clamped, transected and  suture ligated with good hemostasis.  The left utero-ovarian ligament was  clamped, resected, suture ligated with 0 Vicryl.  There was still a fair  amount of bleeding coming from the ovary.  In order to control bleeding  adequately, the left ovary needed to be removed.  The infundibulopelvic  ligament was doubly clamped, transected  and suture ligated with 0 Vicryl x2.  This was done after the ureter was identified and noted to be several  centimeters below the clamp.  The uterine arteries were then skeletonized  and clamped with Heaney clamps, transected, suture ligated with 0 Vicryl.  The cardinal ligaments were clamped, transected, suture ligated with 0  Vicryl as well.  The uterus was then amputated in order to obtain better  visualization of the cervix.  The uterosacrals were then clamped, transected  and suture ligated with 0 Vicryl.  The bladder was noted to be sufficiently  down and the vagina was entered sharply with Mayo scissors and Jorgenson  scissors were then used to remove the cervix from the vagina.  The vagina  was grasped with Kocher clamps and the angles were closed with 0 Vicryl and  good hemostasis was noted.  The rest of the cuff was closed with interrupted  figure-of-eight 0 Vicryl.  The abdomen was copiously irrigated and noted to  be hemostatic.  The appendix was also seen and noted to be grossly normal.  The right ovary was grossly normal.  One  last look into the peritoneal  cavity, and good hemostasis was assured.  All instruments and laps were  removed from the patient's abdomen.  The counts were correct. Fascia,  peritoneum and rectus muscles were then closed with a bulk closure of 0  looped PDS.  Good hemostasis was noted was noted.  Subcutaneous tissue was  copiously irrigated and found to be hemostatic.  Skin was closed with  staples.  The patient tolerated the procedure well.  Sponge, lab,  instrument, and needle counts were x2. The patient went to the recovery room  in stable condition.           ______________________________  Lesly Dukes, M.D.     KHL/MEDQ  D:  01/18/2005  T:  01/18/2005  Job:  [id number]

## 2010-07-24 NOTE — Group Therapy Note (Signed)
[name], [name] NO.:  [id number]   MEDICAL RECORD NO.:  [id number]          PATIENT TYPE:  WOC   LOCATION:  [facility]                   FACILITY:  [facility]   PHYSICIAN:  Argentina Donovan, MD        DATE OF BIRTH:  Jul 26, 1960   DATE OF SERVICE:                                    CLINIC NOTE   HISTORY OF PRESENT ILLNESS:  The patient is a 50 year old woman who presents  for endometrial biopsy. She reports that she has a history of heavy bleeding  for months with a history of anemia that has required transfusions. She was  referred initially for evaluation of fibroids. On an ultrasound done on  June 23, 2004 it showed her uterus measured 17 x 9 x 11 cm and her largest  fibroid measured 4.9 x 3.5 x 5.5.  Please see office note from October 27, 2004 for full details.  The patient was counseled on the risks and benefits of an endometrial biopsy  and wanted to proceed.   PHYSICAL EXAMINATION:  VITAL SIGNS:  Temperature 97. Blood pressure 121/74,  pulse 65. Weight 287.6 pounds, height 5 feet, 11 inches.  PELVIC:  Normal external genitalia. Uterus is grossly enlarged, measuring  near [redacted] weeks gestation. Cervix - Betadine applied, anterior lip was grasped  with the tenaculum, it sounded to 13 cm. Endometrial curet was placed  through the os and a sample obtained.  There was concern that it was mainly blood so it was repeated.   LABORATORY DATA:  Hemoglobin 11.3, hematocrit 34.3 on October 27, 2004.   IMPRESSION:  Fibroid uterus with iron-deficiency anemia.  Status post endometrial biopsy.   PLAN:  The patient is to return to the clinic in 2 to 3 weeks to discuss the  results of her endometrial biopsy. She is also counseled on reasons to  return to the MAU including signs of fever, heavy vaginal bleeding, or  uterine tenderness. We discussed the possibility if the endometrial biopsy  is an inadequate specimen and normal, that she should consider uterine  artery  embolization. However this would be somewhat prohibited because she  is  self-pay. Will discuss an alternate plan at the next visit.     ______________________________  Carolanne Grumbling, M.D.    ______________________________  Argentina Donovan, MD    TW/MEDQ  D:  11/03/2004  T:  11/04/2004  Job:  [id number]

## 2010-07-24 NOTE — H&P (Signed)
[name], [name]                ACCOUNT NO.:  [id number]   MEDICAL RECORD NO.:  [id number]          PATIENT TYPE:  INP   LOCATION:  NA                            [facility]   PHYSICIAN:  Phil D. Okey Dupre, M.D.     DATE OF BIRTH:  [birth year]   DATE OF ADMISSION:  DATE OF DISCHARGE:                                HISTORY & PHYSICAL   DATE OF ADMISSION:  Monday, January 18, 2005   CHIEF COMPLAINT:  Swelling abdomen and heavy vaginal bleeding.   PRESENT ILLNESS:  The patient is a 50 year old gravida 4 para 2-0-2-2 who  has had a long history of heavy bleeding over a many-month period that  required transfusion. She was referred initially for evaluation of fibroids.  An ultrasound done in April showed large fibroid uterus measuring almost to  20-week gestational size. The patient underwent endometrial biopsy at that  time which showed a benign endometrial polyp with inflammation and  infarction and nonpolypoid proliferative endometrium with degenerative  changes. The patient is being admitted for total abdominal hysterectomy  because of intractable menometrorrhagia.   MEDICATIONS:  She is on Xanax t.i.d. and Hemocyte Plus for iron. She has no  known allergies.   PAST MEDICAL HISTORY:  Includes anxiety and panic attacks. Also, she has had  a heart murmur and anemia and has the sickle cell trait.   SURGICAL HISTORY:  None outside the endometrial biopsy.   SOCIAL HISTORY:  She does not drink, smoke, or take illicit drugs.   FAMILY HISTORY:  Her mother and brother were diabetics, her father had bone  cancer, and her two sisters had hysterectomies for fibroids.   REVIEW OF SYSTEMS:  She complains of numbness and weakness in her fingers,  frequent headaches, dizzy spells, and problems with vision. She has  breathing problems when she had a panic attack.   PHYSICAL EXAMINATION:  VITAL SIGNS:  Temperature was 97.6, blood pressure  was 120/80, pulse was 65. The patient weighs 287  pounds, 5 feet 11 inches  tall.  GENERAL:  She is a well-developed, obese black female in no acute distress.  HEENT:  Within normal limits.  NECK:  Supple, symmetrical. Nonenlarged thyroid with no nodules.  LUNGS:  Clear to auscultation and percussion.  HEART:  No murmur, normal sinus rhythm. PMI in fifth intercostal space in  mid clavicular line.  ABDOMEN:  Soft with a palpable abdominal mass up to just above the  umbilicus. No CVA tenderness.  EXTREMITIES:  Negative for varices.  NEUROLOGIC:  DTRs within normal limits.  PELVIC:  External genitalia was normal, the uterus grossly enlarged. The  vagina was clean and well rugated. The cervix was parous and clean and the  uterine cavity sounded 13 cm when endometrial biopsy was obtained. The  adnexa could not be palpated because of the fibroids and the habitus of the  patient. Rectum was negative and  normal.  BREASTS:  Symmetrical in size with no dominant masses.   PLAN:  Total abdominal hysterectomy for symptomatic uterine fibroids.  ______________________________  Javier Glazier Okey Dupre, M.D.     PDR/MEDQ  D:  01/18/2005  T:  01/18/2005  Job:  [id number]

## 2010-07-24 NOTE — Op Note (Signed)
NAMESALEHA, KALP                ACCOUNT NO.:  [id number]   MEDICAL RECORD NO.:  [id number]          PATIENT TYPE:  OBV   LOCATION:  2550                         FACILITY:  [facility]   PHYSICIAN:  Nadara Mustard, MD     DATE OF BIRTH:  1961-02-08   DATE OF PROCEDURE:  12/23/2005  DATE OF DISCHARGE:                                 OPERATIVE REPORT   PREOPERATIVE DIAGNOSIS:  Displaced fracture dislocation left ankle with  Weber B fracture of the fibula.   POSTOPERATIVE DIAGNOSIS:  Displaced fracture dislocation left ankle with  Weber B fracture of the fibula.   PROCEDURE:  Open reduction and internal fixation left fibula.   SURGEON:  Nadara Mustard, M.D.   ANESTHESIA:  General plus popliteal block.   ESTIMATED BLOOD LOSS:  Minimal.   ANTIBIOTICS:  1 gram of Kefzol.   DRAINS:  None.   COMPLICATIONS:  None.   TOURNIQUET TIME:  None.   DISPOSITION:  To PACU in stable condition with anticipated discharge to  home.   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  She was  brought to the emergency room and had a fracture dislocation of the ankle.  This was reduced by the ER physician.  She was then placed in a posterior  splint and orthopedics was consulted for treatment urgently of her fracture  dislocation of the ankle.  The patient was evaluated.  Laboratories and  studies showed she was safe for surgical intervention.  The patient presents  at this time for open reduction and internal fixation.  The risks and  benefits were discussed including infection, neurovascular injury,  persistent pain, arthritis, need for additional surgery.  The patient states  she understands and wished proceed at this time.   PROCEDURE IN DETAIL:  The patient was brought to OR room 5 after undergoing  a popliteal block.  She  then underwent general anesthetic.  After an  adequate level of anesthesia was obtained, the patient's left lower  extremity was prepped using DuraPrep and draped into a sterile field.  A  lateral incision was made over the fibula.  This was carried sharply down to  the fracture site.  A periosteal elevator was used to freshen the fracture  site.  This was reduced with a clamp and a lag screw was placed  perpendicular to the fracture site to stabilize it with a 3.5 cortical left  lag screw.  A locking plate was then applied as a buttress plate laterally  and two locking screws were placed distally and two locking screws  proximally.  The wound was irrigated with normal saline.  C-arm fluoroscopy  verified reduction in both AP and lateral planes.  The mortise was reduced.  The subcu was closed using 2-0 Vicryl.  The skin was closed using approximating staples.  The wound was covered with  Adaptic, orthopedic sponges, Webril and a Coban dressing.  The patient was  extubated and taken to the PACU in stable condition for discharge to home.      Nadara Mustard, MD  Electronically Signed     MVD/MEDQ  D:  12/23/2005  T:  12/24/2005  Job:  [id number]

## 2010-11-02 ENCOUNTER — Emergency Department (HOSPITAL_COMMUNITY)
Admission: EM | Admit: 2010-11-02 | Discharge: 2010-11-02 | Disposition: A | Discharge status: Home / Self Care | Payer: Self-pay | Attending: Emergency Medicine | Admitting: Emergency Medicine

## 2010-11-02 ENCOUNTER — Inpatient Hospital Stay (INDEPENDENT_AMBULATORY_CARE_PROVIDER_SITE_OTHER): Admit: 2010-11-02 | Discharge: 2010-11-02 | Disposition: A | Discharge status: Home / Self Care | Payer: Self-pay

## 2010-11-02 DIAGNOSIS — S335XXA Sprain of ligaments of lumbar spine, initial encounter: Secondary | ICD-10-CM

## 2011-02-08 ENCOUNTER — Encounter: Payer: Self-pay | Admitting: *Deleted

## 2011-02-08 ENCOUNTER — Emergency Department (INDEPENDENT_AMBULATORY_CARE_PROVIDER_SITE_OTHER)
Admission: EM | Admit: 2011-02-08 | Discharge: 2011-02-08 | Disposition: A | Discharge status: Home / Self Care | Payer: Self-pay | Source: Home / Self Care | Attending: Family Medicine | Admitting: Family Medicine

## 2011-02-08 DIAGNOSIS — R6889 Other general symptoms and signs: Secondary | ICD-10-CM

## 2011-02-08 HISTORY — DX: Essential (primary) hypertension: I10

## 2011-02-08 MED ORDER — OSELTAMIVIR PHOSPHATE 75 MG PO CAPS: 75.0000 mg | ORAL_CAPSULE | Freq: Two times a day (BID) | ORAL | Status: AC

## 2011-02-08 NOTE — ED Notes (Signed)
Symptoms  Of  Headache  Fever     Body  Aches       Cough  Congested   Symptoms  X  3  Days   Symptoms  unreleived  By  otc  meds

## 2011-02-08 NOTE — ED Provider Notes (Addendum)
History     CSN: [id number] Arrival date & time: 02/08/2011  3:34 PM   First MD Initiated Contact with Patient 02/08/11 1514      Chief Complaint  Patient presents with  . Headache    (Consider location/radiation/quality/duration/timing/severity/associated sxs/prior treatment) Patient is a 50 y.o. female presenting with headaches. The history is provided by the patient.  Headache The primary symptoms include headaches and fever. Primary symptoms do not include nausea or vomiting. The symptoms began 3 to 5 days ago. The symptoms are worsening.    Past Medical History  Diagnosis Date  . Hypertension     Past Surgical History  Procedure Date  . Abdominal hysterectomy     Family History  Problem Relation Age of Onset  . Hypertension Mother   . Diabetes Mother   . Cancer Father     History  Substance Use Topics  . Smoking status: Never Smoker   . Smokeless tobacco: Not on file  . Alcohol Use: Yes     occasonl    OB History    Grav Para Term Preterm Abortions TAB SAB Ect Mult Living                  Review of Systems  Constitutional: Positive for fever.  HENT: Positive for congestion, rhinorrhea and postnasal drip.   Eyes: Negative.   Respiratory: Negative.   Cardiovascular: Negative.   Gastrointestinal: Negative.  Negative for nausea and vomiting.  Neurological: Positive for headaches.    Allergies  Review of patient's allergies indicates no known allergies.  Home Medications  No current outpatient prescriptions on file.  BP 132/84  Pulse 78  Temp(Src) 100.7 F (38.2 C) (Oral)  Resp 20  SpO2 97%  Physical Exam  Nursing note and vitals reviewed. Constitutional: She appears well-developed and well-nourished.  HENT:  Head: Normocephalic.  Right Ear: External ear normal.  Left Ear: External ear normal.  Mouth/Throat: Oropharynx is clear and moist.  Eyes: Conjunctivae and EOM are normal. Pupils are equal, round, and reactive to light.  Neck:  Normal range of motion. Neck supple.  Cardiovascular: Normal rate, regular rhythm, normal heart sounds and intact distal pulses.   Pulmonary/Chest: Effort normal and breath sounds normal.  Skin: Skin is warm and dry.    ED Course  Procedures (including critical care time)  Labs Reviewed - No data to display No results found.   No diagnosis found.    MDM          Barkley Bruns, MD 02/08/11 4098  Barkley Bruns, MD 02/08/11 [phone]

## 2011-05-10 ENCOUNTER — Other Ambulatory Visit (HOSPITAL_COMMUNITY): Payer: Self-pay | Admitting: Family Medicine

## 2011-05-10 DIAGNOSIS — Z1231 Encounter for screening mammogram for malignant neoplasm of breast: Secondary | ICD-10-CM

## 2011-06-01 ENCOUNTER — Ambulatory Visit (HOSPITAL_COMMUNITY)
Admission: RE | Admit: 2011-06-01 | Discharge: 2011-06-01 | Disposition: A | Discharge status: Home / Self Care | Payer: Self-pay | Source: Ambulatory Visit | Attending: Family Medicine | Admitting: Family Medicine

## 2011-06-01 DIAGNOSIS — Z1231 Encounter for screening mammogram for malignant neoplasm of breast: Secondary | ICD-10-CM | POA: Insufficient documentation

## 2011-11-15 ENCOUNTER — Encounter (HOSPITAL_COMMUNITY): Payer: Self-pay | Admitting: *Deleted

## 2011-11-15 ENCOUNTER — Emergency Department (INDEPENDENT_AMBULATORY_CARE_PROVIDER_SITE_OTHER)
Admission: EM | Admit: 2011-11-15 | Discharge: 2011-11-15 | Disposition: A | Discharge status: Home / Self Care | Payer: Self-pay | Source: Home / Self Care | Attending: Emergency Medicine | Admitting: Emergency Medicine

## 2011-11-15 DIAGNOSIS — M722 Plantar fascial fibromatosis: Secondary | ICD-10-CM

## 2011-11-15 HISTORY — DX: Overactive bladder: N32.81

## 2011-11-15 MED ORDER — METHYLPREDNISOLONE ACETATE 40 MG/ML IJ SUSP
INTRAMUSCULAR | Status: AC
  Filled 2011-11-15: qty 5

## 2011-11-15 NOTE — ED Notes (Signed)
Pt reports left heel pain for the past month with no relief from inserts or new shoes no obvious injuries

## 2011-11-15 NOTE — ED Provider Notes (Signed)
Chief Complaint  Patient presents with  . Foot Pain    History of Present Illness:   [name] is a 51 year old female who has had a one-month history of pain in her plantar left heel. It hurts first thing in the morning when she gets up and she's been on her feet for a while. She denies any injury to the area. Is been no swelling or redness. She denies any numbness or tingling.  Review of Systems:  Other than noted above, the patient denies any of the following symptoms: Systemic:  No fevers, chills, sweats, or aches.  No fatigue or tiredness. Musculoskeletal:  No joint pain, arthritis, bursitis, swelling, back pain, or neck pain. Neurological:  No muscular weakness, paresthesias, headache, or trouble with speech or coordination.  No dizziness.   PMFSH:  Past medical history, family history, social history, meds, and allergies were reviewed.  Physical Exam:   Vital signs:  BP 148/77  Pulse 62  Temp 98.7 F (37.1 C) (Oral)  Resp 18  SpO2 100% Gen:  Alert and oriented times 3.  In no distress. Musculoskeletal: There is pain to palpation over the insertion of the plantar fascia on the left heel. Otherwise, all joints had a full a ROM with no swelling, bruising or deformity.  No edema, pulses full. Extremities were warm and pink.  Capillary refill was brisk.  Skin:  Clear, warm and dry.  No rash. Neuro:  Alert and oriented times 3.  Muscle strength was normal.  Sensation was intact to light touch.   Procedure Note:  Verbal informed consent was obtained from the patient.  Risks and benefits were outlined with the patient.  Patient understands and accepts these risks.  Identity of the patient was confirmed verbally and by armband.    Procedure was performed as followed:  The medial aspect of the heel was prepped with Betadine and alcohol and anesthetized with chloride spray. A 27-gauge 1/2 inch needle was inserted and directed towards the insertion of the plantar fascia. 1 cc of Depo-Medrol 40  mg strength and 1 cc of 2% Xylocaine were injected. The patient was instructed in aftercare including rest and ice.  Patient tolerated the procedure well without any immediate complications.   Assessment:  The encounter diagnosis was Plantar fasciitis.  Plan:   1.  The following meds were prescribed:   New Prescriptions   No medications on file   2.  The patient was instructed in symptomatic care, including rest and activity, elevation, application of ice and compression.  Appropriate handouts were given. 3.  The patient was told to return if becoming worse in any way, if no better in 3 or 4 days, and given some red flag symptoms that would indicate earlier return.   4.  The patient was told to follow up with a podiatrist if no improvement within 2 weeks. She was told to remain off her feet for the next 2 days and then begin some exercises.   Reuben Likes, MD 11/15/11 1351

## 2011-12-24 ENCOUNTER — Encounter (HOSPITAL_COMMUNITY): Payer: Self-pay | Admitting: *Deleted

## 2011-12-24 ENCOUNTER — Emergency Department (INDEPENDENT_AMBULATORY_CARE_PROVIDER_SITE_OTHER)
Admission: EM | Admit: 2011-12-24 | Discharge: 2011-12-24 | Disposition: A | Discharge status: Home / Self Care | Payer: Self-pay | Source: Home / Self Care | Attending: Family Medicine | Admitting: Family Medicine

## 2011-12-24 DIAGNOSIS — I10 Essential (primary) hypertension: Secondary | ICD-10-CM

## 2011-12-24 DIAGNOSIS — J019 Acute sinusitis, unspecified: Secondary | ICD-10-CM

## 2011-12-24 MED ORDER — PANTOPRAZOLE SODIUM 20 MG PO TBEC: 20.0000 mg | DELAYED_RELEASE_TABLET | Freq: Every day | ORAL | Status: DC

## 2011-12-24 MED ORDER — OXYBUTYNIN CHLORIDE 5 MG PO TABS: 5.0000 mg | ORAL_TABLET | Freq: Three times a day (TID) | ORAL | Status: DC

## 2011-12-24 MED ORDER — AZITHROMYCIN 250 MG PO TABS: ORAL_TABLET | ORAL | Status: DC

## 2011-12-24 MED ORDER — TRIAMTERENE-HCTZ 37.5-25 MG PO TABS: 1.0000 | ORAL_TABLET | Freq: Every day | ORAL | Status: DC

## 2011-12-24 NOTE — ED Notes (Signed)
Pt c/o cold sx x2 days... Sx include: spitting up blood, itchy throat, cough... She denies: fevers, vomiting, nausea, diarrhea... Also needing refill on meds... Was a [facility] pt but due to their closure has not been able to see a PCP... Pt is alert w/no signs of distress and sitting up right.

## 2011-12-24 NOTE — ED Provider Notes (Signed)
History     CSN: [id number]  Arrival date & time 12/24/11  5638   First MD Initiated Contact with Patient 12/24/11 0900      Chief Complaint  Patient presents with  . Cough    (Consider location/radiation/quality/duration/timing/severity/associated sxs/prior treatment) Patient is a 51 y.o. female presenting with cough. The history is provided by the patient.  Cough This is a new problem. The current episode started 2 days ago (also here for med refills.). The problem has been gradually worsening. The cough is productive of blood-tinged sputum. There has been no fever. Associated symptoms include rhinorrhea and sore throat. Pertinent negatives include no shortness of breath and no wheezing. She is not a smoker.    Past Medical History  Diagnosis Date  . Hypertension   . OAB (overactive bladder)     Past Surgical History  Procedure Date  . Abdominal hysterectomy     Family History  Problem Relation Age of Onset  . Hypertension Mother   . Diabetes Mother   . Cancer Father     History  Substance Use Topics  . Smoking status: Never Smoker   . Smokeless tobacco: Not on file  . Alcohol Use: Yes     occasonl    OB History    Grav Para Term Preterm Abortions TAB SAB Ect Mult Living                  Review of Systems  Constitutional: Negative.   HENT: Positive for nosebleeds, congestion, sore throat, rhinorrhea and postnasal drip.   Respiratory: Positive for cough. Negative for shortness of breath and wheezing.   Cardiovascular: Negative.   Skin: Negative.     Allergies  Review of patient's allergies indicates no known allergies.  Home Medications   Current Outpatient Rx  Name Route Sig Dispense Refill  . OXYBUTYNIN CHLORIDE 5 MG PO TABS Oral Take 5 mg by mouth 3 (three) times daily.    Marland Kitchen PROTONIX PO Oral Take by mouth.    . TRIAMTERENE-HCTZ 37.5-25 MG PO CAPS Oral Take 1 capsule by mouth every morning.    . AZITHROMYCIN 250 MG PO TABS  Take as directed on  pack 6 each 0  . BUPROPION HCL ER (SR) 150 MG PO TB12 Oral Take 150 mg by mouth 2 (two) times daily.    Marland Kitchen CITALOPRAM HYDROBROMIDE 40 MG PO TABS Oral Take 40 mg by mouth daily.    . OXYBUTYNIN CHLORIDE 5 MG PO TABS Oral Take 1 tablet (5 mg total) by mouth 3 (three) times daily. 30 tablet 1  . PANTOPRAZOLE SODIUM 20 MG PO TBEC Oral Take 1 tablet (20 mg total) by mouth daily. 30 tablet 1  . TRIAMTERENE-HCTZ 37.5-25 MG PO TABS Oral Take 1 each (1 tablet total) by mouth daily. 30 tablet 1    BP 124/81  Pulse 71  Temp 98.3 F (36.8 C) (Oral)  Resp 16  SpO2 99%  Physical Exam  Constitutional: She is oriented to person, place, and time. She appears well-developed and well-nourished.  HENT:  Head: Normocephalic.  Right Ear: External ear normal.  Left Ear: External ear normal.  Nose: Nose normal.  Mouth/Throat: Oropharynx is clear and moist.  Eyes: Pupils are equal, round, and reactive to light.  Neck: Normal range of motion. Neck supple.  Cardiovascular: Normal rate, normal heart sounds and intact distal pulses.   Pulmonary/Chest: Effort normal and breath sounds normal.  Lymphadenopathy:    She has no cervical adenopathy.  Neurological:  She is alert and oriented to person, place, and time.  Skin: Skin is warm and dry.  Psychiatric: She has a normal mood and affect.    ED Course  Procedures (including critical care time)  Labs Reviewed - No data to display No results found.   1. Sinusitis, acute   2. Hypertension, benign       MDM          Linna Hoff, MD 12/24/11 [phone]

## 2011-12-24 NOTE — ED Notes (Signed)
Above  Notes  Charted  By this  Writer          Entered     In  Error

## 2012-08-06 ENCOUNTER — Encounter (HOSPITAL_COMMUNITY): Payer: Self-pay | Admitting: Nurse Practitioner

## 2012-08-06 ENCOUNTER — Emergency Department (HOSPITAL_COMMUNITY)
Admission: EM | Admit: 2012-08-06 | Discharge: 2012-08-06 | Disposition: A | Discharge status: Home / Self Care | Payer: Self-pay | Attending: Emergency Medicine | Admitting: Emergency Medicine

## 2012-08-06 DIAGNOSIS — R519 Headache, unspecified: Secondary | ICD-10-CM

## 2012-08-06 DIAGNOSIS — Z79899 Other long term (current) drug therapy: Secondary | ICD-10-CM | POA: Insufficient documentation

## 2012-08-06 DIAGNOSIS — H9201 Otalgia, right ear: Secondary | ICD-10-CM

## 2012-08-06 DIAGNOSIS — H9209 Otalgia, unspecified ear: Secondary | ICD-10-CM | POA: Insufficient documentation

## 2012-08-06 DIAGNOSIS — Z87448 Personal history of other diseases of urinary system: Secondary | ICD-10-CM | POA: Insufficient documentation

## 2012-08-06 DIAGNOSIS — I10 Essential (primary) hypertension: Secondary | ICD-10-CM | POA: Insufficient documentation

## 2012-08-06 DIAGNOSIS — Z862 Personal history of diseases of the blood and blood-forming organs and certain disorders involving the immune mechanism: Secondary | ICD-10-CM | POA: Insufficient documentation

## 2012-08-06 DIAGNOSIS — Z8639 Personal history of other endocrine, nutritional and metabolic disease: Secondary | ICD-10-CM | POA: Insufficient documentation

## 2012-08-06 DIAGNOSIS — R51 Headache: Secondary | ICD-10-CM | POA: Insufficient documentation

## 2012-08-06 HISTORY — DX: Pure hypercholesterolemia, unspecified: E78.00

## 2012-08-06 MED ORDER — LORATADINE 10 MG PO TABS: 10.0000 mg | ORAL_TABLET | Freq: Every day | ORAL | Status: DC

## 2012-08-06 MED ORDER — METOCLOPRAMIDE HCL 5 MG/ML IJ SOLN
10.0000 mg | Freq: Once | INTRAMUSCULAR | Status: AC
  Administered 2012-08-06: 10 mg via INTRAMUSCULAR
  Filled 2012-08-06: qty 2

## 2012-08-06 MED ORDER — METOCLOPRAMIDE HCL 5 MG/ML IJ SOLN: 10.0000 mg | Freq: Once | INTRAMUSCULAR | Status: DC

## 2012-08-06 MED ORDER — KETOROLAC TROMETHAMINE 60 MG/2ML IM SOLN
60.0000 mg | Freq: Once | INTRAMUSCULAR | Status: AC
  Administered 2012-08-06: 60 mg via INTRAMUSCULAR
  Filled 2012-08-06: qty 2

## 2012-08-06 MED ORDER — DIPHENHYDRAMINE HCL 25 MG PO CAPS
50.0000 mg | ORAL_CAPSULE | Freq: Once | ORAL | Status: AC
  Administered 2012-08-06: 50 mg via ORAL
  Filled 2012-08-06: qty 2

## 2012-08-06 NOTE — ED Provider Notes (Signed)
History     CSN: [id number]  Arrival date & time 08/06/12  1125   First MD Initiated Contact with Patient 08/06/12 1152      Chief Complaint  Patient presents with  . Headache    (Consider location/radiation/quality/duration/timing/severity/associated sxs/prior treatment) HPI Comments: Patient had some leftover eardrops from a prior visit for ear pain, has been using over the last several days which does improve the ear pain. She still has medication drops left over.  Patient is a 52 y.o. female presenting with headaches. The history is provided by the patient.  Headache Pain location:  Frontal Quality:  Dull Radiates to:  Does not radiate Severity currently:  Unable to specify Onset quality:  Gradual Duration:  1 day Timing:  Constant Progression:  Unchanged Chronicity:  New Similar to prior headaches: yes   Context: bright light   Context: not coughing   Relieved by:  Nothing Associated symptoms: ear pain, photophobia and sinus pressure   Associated symptoms: no abdominal pain, no cough, no diarrhea, no dizziness, no pain, no fever, no focal weakness, no neck pain, no neck stiffness, no numbness, no paresthesias, no URI, no vomiting and no weakness     Past Medical History  Diagnosis Date  . Hypertension   . OAB (overactive bladder)   . Hypercholesteremia     Past Surgical History  Procedure Laterality Date  . Abdominal hysterectomy      Family History  Problem Relation Age of Onset  . Hypertension Mother   . Diabetes Mother   . Cancer Father     History  Substance Use Topics  . Smoking status: Never Smoker   . Smokeless tobacco: Not on file  . Alcohol Use: Yes     Comment: occasonl    OB History   Grav Para Term Preterm Abortions TAB SAB Ect Mult Living                  Review of Systems  Constitutional: Negative for fever and chills.  HENT: Positive for ear pain and sinus pressure. Negative for neck pain and neck stiffness.   Eyes: Positive  for photophobia. Negative for pain.  Respiratory: Negative for cough, shortness of breath and wheezing.   Cardiovascular: Negative for chest pain.  Gastrointestinal: Negative for vomiting, abdominal pain and diarrhea.  Neurological: Positive for headaches. Negative for dizziness, focal weakness, speech difficulty, weakness, numbness and paresthesias.    Allergies  Review of patient's allergies indicates no known allergies.  Home Medications   Current Outpatient Rx  Name  Route  Sig  Dispense  Refill  . ibuprofen (ADVIL,MOTRIN) 800 MG tablet   Oral   Take 800 mg by mouth every 8 (eight) hours as needed for pain.         . pantoprazole (PROTONIX) 20 MG tablet   Oral   Take 1 tablet (20 mg total) by mouth daily.   30 tablet   1   . triamterene-hydrochlorothiazide (DYAZIDE) 37.5-25 MG per capsule   Oral   Take 1 capsule by mouth every morning.         . loratadine (CLARITIN) 10 MG tablet   Oral   Take 1 tablet (10 mg total) by mouth daily.   30 tablet   0     BP 135/89  Pulse 77  Temp(Src) 98.1 F (36.7 C) (Oral)  Resp 18  SpO2 99%  Physical Exam  Nursing note and vitals reviewed. Constitutional: She is oriented to person, place, and time.  She appears well-developed and well-nourished. No distress.  HENT:  Head: Normocephalic and atraumatic. Head is without Battle's sign, without abrasion and without contusion.  Right Ear: Hearing and external ear normal. No drainage, swelling or tenderness. No mastoid tenderness. No middle ear effusion.  Left Ear: External ear normal.  Mouth/Throat: Uvula is midline, oropharynx is clear and moist and mucous membranes are normal.  Neurological: She is alert and oriented to person, place, and time. She has normal strength. No cranial nerve deficit or sensory deficit. She exhibits normal muscle tone. She displays a negative Romberg sign. GCS eye subscore is 4. GCS verbal subscore is 5. GCS motor subscore is 6.  No arm drift, normal  finger to nose    ED Course  Procedures (including critical care time)  Labs Reviewed - No data to display No results found.   1. Headache   2. Ear pain, right     ra sat is 99% and I interpret to be normal  MDM  Pt with nonfocal neuro exam, no meds tried PTA.  No stiff neck, no fever, no rash.  Will give IM toradol, reglan and PO benadryl.  Pt can follow upw with PCP otherwise this week.  Rx for allergy meds        Gavin Pound. Weston Fulco, MD 08/06/12 1206

## 2012-08-06 NOTE — Discharge Instructions (Signed)
General Headache Without Cause A headache is pain or discomfort felt around the head or neck area. The specific cause of a headache may not be found. There are many causes and types of headaches. A few common ones are:  Tension headaches.  Migraine headaches.  Cluster headaches.  Chronic daily headaches. HOME CARE INSTRUCTIONS   Keep all follow-up appointments with your caregiver or any specialist referral.  Only take over-the-counter or prescription medicines for pain or discomfort as directed by your caregiver.  Lie down in a dark, quiet room when you have a headache.  Keep a headache journal to find out what may trigger your migraine headaches. For example, write down:  What you eat and drink.  How much sleep you get.  Any change to your diet or medicines.  Try massage or other relaxation techniques.  Put ice packs or heat on the head and neck. Use these 3 to 4 times per day for 15 to 20 minutes each time, or as needed.  Limit stress.  Sit up straight, and do not tense your muscles.  Quit smoking if you smoke.  Limit alcohol use.  Decrease the amount of caffeine you drink, or stop drinking caffeine.  Eat and sleep on a regular schedule.  Get 7 to 9 hours of sleep, or as recommended by your caregiver.  Keep lights dim if bright lights bother you and make your headaches worse. SEEK MEDICAL CARE IF:   You have problems with the medicines you were prescribed.  Your medicines are not working.  You have a change from the usual headache.  You have nausea or vomiting. SEEK IMMEDIATE MEDICAL CARE IF:   Your headache becomes severe.  You have a fever.  You have a stiff neck.  You have loss of vision.  You have muscular weakness or loss of muscle control.  You start losing your balance or have trouble walking.  You feel faint or pass out.  You have severe symptoms that are different from your first symptoms. MAKE SURE YOU:   Understand these  instructions.  Will watch your condition.  Will get help right away if you are not doing well or get worse. Document Released: 02/22/2005 Document Revised: 05/17/2011 Document Reviewed: 03/10/2011 [employer] Patient Information 2014 [employer].  

## 2012-08-06 NOTE — ED Notes (Signed)
Pt c/o R earache and headache onset this am. States she thinks its either sinuses or her BP. Also reports  history of ear infections. Out of BP meds since Friday, states she often forgets to take it anyways. States Pain is "between my eyes." denies any other complaints,. A&Ox4, resp e/u.

## 2012-11-14 ENCOUNTER — Encounter (HOSPITAL_COMMUNITY): Payer: Self-pay | Admitting: Emergency Medicine

## 2012-11-14 ENCOUNTER — Emergency Department (HOSPITAL_COMMUNITY)
Admission: EM | Admit: 2012-11-14 | Discharge: 2012-11-14 | Disposition: A | Discharge status: Home / Self Care | Payer: No Typology Code available for payment source | Source: Home / Self Care

## 2012-11-14 DIAGNOSIS — M65839 Other synovitis and tenosynovitis, unspecified forearm: Secondary | ICD-10-CM

## 2012-11-14 DIAGNOSIS — M659 Synovitis and tenosynovitis, unspecified: Secondary | ICD-10-CM

## 2012-11-14 MED ORDER — TRIAMCINOLONE ACETONIDE 40 MG/ML IJ SUSP
INTRAMUSCULAR | Status: AC
  Filled 2012-11-14: qty 1

## 2012-11-14 NOTE — ED Provider Notes (Addendum)
CSN: [id number]     Arrival date & time 11/14/12  0910 History   First MD Initiated Contact with Patient 11/14/12 [phone]     Chief Complaint  Patient presents with  . Foot Pain   (Consider location/radiation/quality/duration/timing/severity/associated sxs/prior Treatment) HPI Comments: Pain L plantar forefoot just proximal to 2nd and 3rd toe. Pain in the L thenar emminence, esp with opposition. 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   Past Medical History  Diagnosis Date  . Hypertension   . OAB (overactive bladder)   . Hypercholesteremia    Past Surgical History  Procedure Laterality Date  . Abdominal hysterectomy     Family History  Problem Relation Age of Onset  . Hypertension Mother   . Diabetes Mother   . Cancer Father    History  Substance Use Topics  . Smoking status: Never Smoker   . Smokeless tobacco: Not on file  . Alcohol Use: Yes     Comment: occasonl   OB History   Grav Para Term Preterm Abortions TAB SAB Ect Mult Living                 Review of Systems  Constitutional: Negative for fever, chills and activity change.  HENT: Negative.   Respiratory: Negative.   Cardiovascular: Negative.   Musculoskeletal:       As per HPI  Skin: Negative for color change, pallor and rash.  Neurological: Negative.     Allergies  Review of patient's allergies indicates no known allergies.  Home Medications   Current Outpatient Rx  Name  Route  Sig  Dispense  Refill  . ibuprofen (ADVIL,MOTRIN) 800 MG tablet   Oral   Take 800 mg by mouth every 8 (eight) hours as needed for pain.         Marland Kitchen loratadine (CLARITIN) 10 MG tablet   Oral   Take 1 tablet (10 mg total) by mouth daily.   30 tablet   0   . pantoprazole (PROTONIX) 20 MG tablet   Oral   Take 1 tablet (20 mg total) by mouth daily.   30 tablet   1   . triamterene-hydrochlorothiazide (DYAZIDE) 37.5-25 MG per capsule   Oral   Take 1 capsule by mouth every morning.          BP 153/90  Pulse  53  Temp(Src) 98.3 F (36.8 C) (Oral)  Resp 16  SpO2 98% Physical Exam  Nursing note and vitals reviewed. Constitutional: She is oriented to person, place, and time. She appears well-developed and well-nourished. No distress.  HENT:  Head: Normocephalic and atraumatic.  Eyes: EOM are normal. Pupils are equal, round, and reactive to light.  Neck: Normal range of motion. Neck supple.  Musculoskeletal:  Marked tenderness proximal to the toes 2 and 3 Right foot with pain upon passive or active extension. N swelling or deformity or discoloration.   Tender L thenar emminence, esp pain with opposition. Mild swelling.  Lymphadenopathy:    She has no cervical adenopathy.  Neurological: She is alert and oriented to person, place, and time. No cranial nerve deficit.  Skin: Skin is warm and dry.  Psychiatric: She has a normal mood and affect.    ED Course  Injection of joint Date/Time: 11/14/2012 10:00 AM Performed by: Phineas Real, Ragan Duhon Authorized by: Bradd Canary D Consent: Verbal consent obtained. Risks and benefits: risks, benefits and alternatives were discussed Consent given by: patient Patient understanding: patient states understanding of the procedure being performed Patient  identity confirmed: verbally with patient Preparation: Patient was prepped and draped in the usual sterile fashion. Local anesthesia used: yes Local anesthetic: bupivacaine 0.5% without epinephrine Anesthetic total: 2 ml Comments: Combination of kenalog and marcaine tot vol of 3 cc to base of toes, plantar surface injected with 27 g needle.   (including critical care time) Labs Review Labs Reviewed - No data to display Imaging Review No results found.  MDM   1. Tendinitis of thumb   2. Toe tendinitis       Wear L thumb splint for about 1 week as whenever having pain Injection of marcaine and Kenalog to the L distal plantar foot Roll feet over a cold can and apply ice to the thumb and right forefoot.   For continued problems see the posdiatrist  Hayden Rasmussen, NP 11/14/12 1015  Hayden Rasmussen, NP 03/16/13 1700

## 2012-11-14 NOTE — ED Notes (Signed)
Pt c/o bilateral foot pain onset 1 month Seen here before for similar sxs... States she rec'd a cortisone inj Left heel pain and right plantar pain Also c/o left hand palm pain (near thumb) Denies: inj/trauma... Reports she's always on her feet at work  Alert w/no signs of acute distress.

## 2012-11-15 NOTE — ED Provider Notes (Signed)
Medical screening examination/treatment/procedure(s) were performed by resident physician or non-physician practitioner and as supervising physician I was immediately available for consultation/collaboration.   Barkley Bruns MD.   Linna Hoff, MD 11/15/12 [phone]

## 2013-03-19 NOTE — ED Provider Notes (Signed)
Medical screening examination/treatment/procedure(s) were performed by resident physician or non-physician practitioner and as supervising physician I was immediately available for consultation/collaboration.   Griffith Santilli DOUGLAS MD.   Robby Pirani D Elon Lomeli, MD 03/19/13 1437 

## 2013-10-21 ENCOUNTER — Emergency Department (HOSPITAL_COMMUNITY): Payer: Self-pay

## 2013-10-21 ENCOUNTER — Emergency Department (HOSPITAL_COMMUNITY)
Admission: EM | Admit: 2013-10-21 | Discharge: 2013-10-21 | Disposition: A | Discharge status: Home / Self Care | Payer: Self-pay | Attending: Emergency Medicine | Admitting: Emergency Medicine

## 2013-10-21 DIAGNOSIS — Y9389 Activity, other specified: Secondary | ICD-10-CM | POA: Insufficient documentation

## 2013-10-21 DIAGNOSIS — S79919A Unspecified injury of unspecified hip, initial encounter: Secondary | ICD-10-CM | POA: Insufficient documentation

## 2013-10-21 DIAGNOSIS — S79929A Unspecified injury of unspecified thigh, initial encounter: Secondary | ICD-10-CM

## 2013-10-21 DIAGNOSIS — S46909A Unspecified injury of unspecified muscle, fascia and tendon at shoulder and upper arm level, unspecified arm, initial encounter: Secondary | ICD-10-CM | POA: Insufficient documentation

## 2013-10-21 DIAGNOSIS — S4980XA Other specified injuries of shoulder and upper arm, unspecified arm, initial encounter: Secondary | ICD-10-CM | POA: Insufficient documentation

## 2013-10-21 DIAGNOSIS — Z8639 Personal history of other endocrine, nutritional and metabolic disease: Secondary | ICD-10-CM | POA: Insufficient documentation

## 2013-10-21 DIAGNOSIS — M25552 Pain in left hip: Secondary | ICD-10-CM

## 2013-10-21 DIAGNOSIS — Y92410 Unspecified street and highway as the place of occurrence of the external cause: Secondary | ICD-10-CM | POA: Insufficient documentation

## 2013-10-21 DIAGNOSIS — I10 Essential (primary) hypertension: Secondary | ICD-10-CM | POA: Insufficient documentation

## 2013-10-21 DIAGNOSIS — Z862 Personal history of diseases of the blood and blood-forming organs and certain disorders involving the immune mechanism: Secondary | ICD-10-CM | POA: Insufficient documentation

## 2013-10-21 DIAGNOSIS — Z79899 Other long term (current) drug therapy: Secondary | ICD-10-CM | POA: Insufficient documentation

## 2013-10-21 DIAGNOSIS — M25512 Pain in left shoulder: Secondary | ICD-10-CM

## 2013-10-21 DIAGNOSIS — Z87448 Personal history of other diseases of urinary system: Secondary | ICD-10-CM | POA: Insufficient documentation

## 2013-10-21 MED ORDER — ACETAMINOPHEN 500 MG PO TABS
1000.0000 mg | ORAL_TABLET | Freq: Once | ORAL | Status: AC
  Administered 2013-10-21: 1000 mg via ORAL
  Filled 2013-10-21: qty 2

## 2013-10-21 NOTE — Discharge Instructions (Signed)
Contusion °A contusion is the result of an injury to the skin and underlying tissues and is usually caused by direct trauma. The injury results in the appearance of a bruise on the skin overlying the injured tissues. Contusions cause rupture and bleeding of the small capillaries and blood vessels and affect function, because the bleeding infiltrates muscles, tendons, nerves, or other soft tissues.  °SYMPTOMS  °· Swelling and often a hard lump in the injured area, either superficial or deep. °· Pain and tenderness over the area of the contusion. °· Feeling of firmness when pressure is exerted over the contusion. °· Discoloration under the skin, beginning with redness and progressing to the characteristic "black and blue" bruise. °CAUSES  °A contusion is typically the result of direct trauma. This is often by a blunt object.  °RISK INCREASES WITH: °· Sports that have a high likelihood of trauma (football, boxing, ice hockey, soccer, field hockey, martial arts, basketball, and baseball). °· Sports that make falling from a height likely (high-jumping, pole-vaulting, skating, or gymnastics). °· Any bleeding disorder (hemophilia) or taking medications that affect clotting (aspirin, nonsteroidal anti-inflammatory medications, or warfarin [Coumadin]). °· Inadequate protection of exposed areas during contact sports. °PREVENTION °· Maintain physical fitness: °¨ Joint and muscle flexibility. °¨ Strength and endurance. °¨ Coordination. °· Wear proper protective equipment. Make sure it fits correctly. °PROGNOSIS  °Contusions typically heal without any complications. Healing time varies with the severity of injury and intake of medications that affect clotting. Contusions usually heal in 1 to 4 weeks. °RELATED COMPLICATIONS  °· Damage to nearby nerves or blood vessels, causing numbness, coldness, or paleness. °· Compartment syndrome. °· Bleeding into the soft tissues that leads to disability. °· Infiltrative-type bleeding,  leading to the calcification and impaired function of the injured muscle (rare). °· Prolonged healing time if usual activities are resumed too soon. °· Infection if the skin over the injury site is broken. °· Fracture of the bone underlying the contusion. °· Stiffness in the joint where the injured muscle crosses. °TREATMENT  °Treatment initially consists of resting the injured area as well as medication and ice to reduce inflammation. The use of a compression bandage may also be helpful in minimizing inflammation. As pain diminishes and movement is tolerated, the joint where the affected muscle crosses should be moved to prevent stiffness and the shortening (contracture) of the joint. Movement of the joint should begin as soon as possible. It is also important to work on maintaining strength within the affected muscles. °Occasionally, extra padding over the area of contusion may be recommended before returning to sports, particularly if re-injury is likely.  °MEDICATION  °· If pain relief is necessary these medications are often recommended: °¨ Nonsteroidal anti-inflammatory medications, such as aspirin and ibuprofen. °¨ Other minor pain relievers, such as acetaminophen, are often recommended. °· Prescription pain relievers may be given by your caregiver. Use only as directed and only as much as you need. °HEAT AND COLD °· Cold treatment (icing) relieves pain and reduces inflammation. Cold treatment should be applied for 10 to 15 minutes every 2 to 3 hours for inflammation and pain and immediately after any activity that aggravates your symptoms. Use ice packs or an ice massage. (To do an ice massage fill a large styrofoam cup with water and freeze. Tear a small amount of foam from the top so ice protrudes. Massage ice firmly over the injured area in a circle about the size of a softball.) °· Heat treatment may be used prior to   performing the stretching and strengthening activities prescribed by your caregiver,  physical therapist, or athletic trainer. Use a heat pack or a warm soak. SEEK MEDICAL CARE IF:   Symptoms get worse or do not improve despite treatment in a few days.  You have difficulty moving a joint.  Any extremity becomes extremely painful, numb, pale, or cool (This is an emergency!).  Medication produces any side effects (bleeding, upset stomach, or allergic reaction).  Signs of infection (drainage from skin, headache, muscle aches, dizziness, fever, or general ill feeling) occur if skin was broken. Document Released: 02/22/2005 Document Revised: 05/17/2011 Document Reviewed: 06/06/2008 [facility] Patient Information 2015 [place], [place]. This information is not intended to replace advice given to you by your health care provider. Make sure you discuss any questions you have with your health care provider.   Emergency Department Resource Guide 1) Find a Doctor and Pay Out of Pocket Although you won't have to find out who is covered by your insurance plan, it is a good idea to ask around and get recommendations. You will then need to call the office and see if the doctor you have chosen will accept you as a new patient and what types of options they offer for patients who are self-pay. Some doctors offer discounts or will set up payment plans for their patients who do not have insurance, but you will need to ask so you aren't surprised when you get to your appointment.  2) Contact Your Local Health Department Not all health departments have doctors that can see patients for sick visits, but many do, so it is worth a call to see if yours does. If you don't know where your local health department is, you can check in your phone book. The CDC also has a tool to help you locate your state's health department, and many state websites also have listings of all of their local health departments.  3) Find a [facility] If your illness is not likely to be very severe or complicated, you may want  to try a walk in clinic. These are popping up all over the country in pharmacies, drugstores, and shopping centers. They're usually staffed by nurse practitioners or physician assistants that have been trained to treat common illnesses and complaints. They're usually fairly quick and inexpensive. However, if you have serious medical issues or chronic medical problems, these are probably not your best option.  No Primary Care Doctor: - Call Health Connect at  [phone] - they can help you locate a primary care doctor that  accepts your insurance, provides certain services, etc. - Physician Referral Service- [phone]  Chronic Pain Problems: Organization         Address  Phone   Notes  [facility]  [phone] Patients need to be referred by their primary care doctor.   Medication Assistance: Organization         Address  Phone   Notes  [facility] Medication [facility] [place] [place], [place] [address] [phone] --Must be a resident of [facility] -- Must have NO insurance coverage whatsoever (no Medicaid/ Medicare, etc.) -- The pt. MUST have a primary care doctor that directs their care regularly and follows them in the community   [facility]ist  [phone]   [employer]  [phone]    Agencies that provide inexpensive medical care: Organization         Address  Phone   Notes  [facility]  [phone]   [facility]    [phone]   [address] [facility] [place], [place] [address] [phone]   [place] [address], [place] [phone]   Planned Parenthood    [phone]   [facility]    [phone]   [place] and [place] [address], [place] Phone:  [phone], Fax:  [phone] Hours of Operation:  9 am - 6 pm, M-F.  Also accepts Medicaid/Medicare and self-pay.   [facility] [place] for [place] [address], [address], [place] Phone: [phone], Fax: [phone]. Hours of Operation:  8:30 am - 5:30 pm, M-F.  Also accepts Medicaid and self-pay.  [facility] [place] [address] [place] Phone: [phone]   [place], [place], [place] [phone], Ext. [phone] Mondays & Thursdays: 7-9 AM.  First 15 patients are seen on a first come, first serve basis.    [place] Providers:  Organization         Address  Phone   Notes  [facility] [address], [place] [phone] Also accepts self-pay patients.  [facility] [address], [place]  [phone]   [place], [address], [place] [phone]   [facility] [address] [place] [phone]   Lucianne Lei [address], [place]   [phone] Only accepts [place] Access [place] patients after they have their name applied to their card.   Self-Pay (no insurance) in [facility]:  Organization         Address  Phone   Notes  Sickle Cell Patients, [facility] [place] [phone]   [facility] Urgent Care [place] [phone]   [facility] [place]  [place], [place], [place] [phone]   [facility]/Dr. Osei-Bonsu  [address] [place] or [address], [place] [phone] Phone number for both [place] and [place] locations is the same.  Urgent Medical and [facility] [address], [place] [phone]   [facility] [address] [place] or [address] [phone]   [facility] [address], [place] [phone], phone; [phone], fax Sees patients 1st and 3rd Saturday of every month.  Must not qualify for public or private insurance (i.e. Medicaid, Medicare, [place] Health Choice, Veterans' Benefits)  Household income should be no more than 200% of the poverty level The clinic cannot treat you if you are pregnant or think you are pregnant  Sexually transmitted diseases are not treated at the clinic.    Dental Care: Organization         Address  Phone  Notes  [facility] Department of [facility] [phone] Accepts children up to age 80 who are enrolled in [place] or [place]; pregnant women with a Medicaid card; and children who have applied for Medicaid or  Health Choice, but were declined, whose parents can pay a reduced fee at time of service.  [facility] Department of  [facility]  [address], [place] [phone] Accepts children up to age 35 who are enrolled in Medicaid or [place]; pregnant women with a Medicaid card; and children who have applied for Medicaid or [place] Health Choice, but were declined, whose parents can pay a reduced fee at time of service.  [place] Adult [facility] Access PROGRAM  [place] [phone] Patients are seen by appointment only. Walk-ins are not accepted. [place] will see patients 48 years of age and older. Monday - Tuesday (8am-5pm) Most Wednesdays (8:30-5pm) $30 per visit, cash only  [facility] Adult Dental Access PROGRAM  [address], [facility] [phone] Patients are seen by appointment only. Walk-ins are not accepted. [place] will see patients 11 years of age and older. One Wednesday Evening (Monthly: Volunteer Based).  $30 per visit, cash only  [place]  [phone] for adults; Children under age 60, call Graduate Pediatric Dentistry at [phone]. Children aged 84-14, please call [phone] to request a pediatric application.  Dental services are provided in all areas of dental care including fillings, crowns and bridges, complete and partial dentures, implants, gum treatment, root canals, and extractions. Preventive care is also provided. Treatment is provided to both adults and children. Patients are selected via a lottery and there is often a waiting list.   [facility] [address] [place]  [phone] [email or web address]   [facility] [address] [place], [place] [phone], Ext. [phone] Second and Fourth Thursday of each month, opens at 6:30 AM; Clinic ends at 9 AM.  Patients are seen on a first-come first-served basis, and a limited number are seen during each clinic.   [facility]  [address] Hillard Danker [place], [place] [phone]   Eligibility Requirements You must have lived in [place], [place], or [place] counties for at least the last three months.   You cannot be eligible for state or federal sponsored [employer], including [employer], [place], or [employer].   You generally cannot be eligible for healthcare insurance through your employer.    How to apply: Eligibility screenings are held every Tuesday and Wednesday afternoon from 1:00 pm until 4:00 pm. You do not need an appointment for the interview!  [facility] [address], [place], [place]   [place]  [place] Department  [place]  [phone]    Behavioral Health Resources in the Community: Intensive Outpatient Programs Organization         Address  Phone  Notes  [place]. [address] [place], [place] [phone]   [facility]patient [address], [place], [place]   ADS: Alcohol & Drug Svcs [address] [place], [place]    [place] [address]  [place], [place] or [phone]   Substance Abuse Resources Organization         Address  Phone  Notes  Alcohol and Drug Services  [phone]   [place]  [phone]   [facility] [place]   Chinita Pester  [phone]   Residential & Outpatient Substance Abuse Program  [phone]   Psychological Services Organization         Address  Phone  Notes  [place]  [place]   [place] [address], [place] or [phone]    Mobile Crisis Teams Organization         Address  Phone  Notes  [facility], Mobile Crisis Care Unit  [phone]   [facility]  [address] [place], [place]   [place] Levels [address] [place] [phone]    Self-Help/Support Groups Organization         Address  Phone             Notes  [place]. of [place] - variety of support groups  [place] Call for more information  Narcotics Anonymous (NA), [facility] [address], [employer] [place]  2 meetings at this location   Special educational needs teacher         Address  Phone  Notes  [facility] [place],    [place]  [phone]   [facility]  [address], [place] [phone], [place], [place]   [place], [place] [phone] Admissions: 8am-3pm M-F  Incentives Substance [place] [address],    [place], [place] [phone]   [facility] [address] [place], [place], [place]   [facility] [address]  [place], [place]   [facility] Programs - Intensive Outpatient [address], [place], [place]   [facility] [place].,  [place], [place] [phone] or [phone]   [facility] Services (RTS) [address] [place], [place] Accepts Medicaid  [facility] [address],  [place] [phone] Substance Abuse/Addiction Treatment   [facility] [place] Organization         Address  Phone  Notes  [facility]  [phone]   Domenic Schwab, PhD [address] Arlis Porta [place], [place]   [phone] or [phone]   [place], [place] [phone]   [facility] [address] [place], [place] [phone] Insurance/Medicaid/sponsorship through [facility] and Families [address] [place]                                    [place], [place] [phone] [place] [address], [place] [phone]    Dr. Adele Schilder  [phone]   [facility] [place] Dept. 1) [address], [place] 2) [place] 3)  [address] [address], [place] [phone]  [phone]   [place] [phone] or [phone] (After Hours)
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
## 2013-10-21 NOTE — ED Provider Notes (Signed)
Medical screening examination/treatment/procedure(s) were performed by non-physician practitioner and as supervising physician I was immediately available for consultation/collaboration.   EKG Interpretation None        Ezequiel Essex, MD 10/21/13 [phone]
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
## 2013-11-05 ENCOUNTER — Ambulatory Visit: Payer: Self-pay | Attending: Internal Medicine | Admitting: Internal Medicine

## 2013-11-05 ENCOUNTER — Encounter: Payer: Self-pay | Admitting: Internal Medicine

## 2013-11-05 VITALS — BP 145/85 | HR 65 | Temp 98.4°F | Resp 15 | Wt 200.8 lb

## 2013-11-05 DIAGNOSIS — I10 Essential (primary) hypertension: Secondary | ICD-10-CM

## 2013-11-05 DIAGNOSIS — F1210 Cannabis abuse, uncomplicated: Secondary | ICD-10-CM | POA: Insufficient documentation

## 2013-11-05 DIAGNOSIS — Z1389 Encounter for screening for other disorder: Secondary | ICD-10-CM | POA: Insufficient documentation

## 2013-11-05 DIAGNOSIS — Z1211 Encounter for screening for malignant neoplasm of colon: Secondary | ICD-10-CM

## 2013-11-05 DIAGNOSIS — Z8249 Family history of ischemic heart disease and other diseases of the circulatory system: Secondary | ICD-10-CM | POA: Insufficient documentation

## 2013-11-05 DIAGNOSIS — N318 Other neuromuscular dysfunction of bladder: Secondary | ICD-10-CM | POA: Insufficient documentation

## 2013-11-05 DIAGNOSIS — K219 Gastro-esophageal reflux disease without esophagitis: Secondary | ICD-10-CM

## 2013-11-05 DIAGNOSIS — E7800 Pure hypercholesterolemia, unspecified: Secondary | ICD-10-CM | POA: Insufficient documentation

## 2013-11-05 DIAGNOSIS — Z139 Encounter for screening, unspecified: Secondary | ICD-10-CM

## 2013-11-05 LAB — LIPID PANEL
CHOL/HDL RATIO: 2.4 ratio
CHOLESTEROL: 208 mg/dL — AB (ref 0–200)
HDL: 87 mg/dL (ref >39)
LDL Cholesterol: 93 mg/dL (ref 0–99)
TRIGLYCERIDES: 138 mg/dL (ref <150)
VLDL: 28 mg/dL (ref 0–40)

## 2013-11-05 LAB — COMPLETE METABOLIC PANEL WITH GFR
ALK PHOS: 60 U/L (ref 39–117)
ALT: 16 U/L (ref 0–35)
AST: 16 U/L (ref 0–37)
Albumin: 4.5 g/dL (ref 3.5–5.2)
BILIRUBIN TOTAL: 0.7 mg/dL (ref 0.2–1.2)
BUN: 20 mg/dL (ref 6–23)
CO2: 23 mEq/L (ref 19–32)
CREATININE: 0.69 mg/dL (ref 0.50–1.10)
Calcium: 9.8 mg/dL (ref 8.4–10.5)
Chloride: 106 mEq/L (ref 96–112)
GFR, Est African American: >89 mL/min
GFR, Est Non African American: >89 mL/min
Glucose, Bld: 118 mg/dL — ABNORMAL HIGH (ref 70–99)
Potassium: 4.1 mEq/L (ref 3.5–5.3)
Sodium: 140 mEq/L (ref 135–145)
Total Protein: 7.2 g/dL (ref 6.0–8.3)

## 2013-11-05 LAB — CBC WITH DIFFERENTIAL/PLATELET
BASOS ABS: 0 10*3/uL (ref 0.0–0.1)
BASOS PCT: 0 % (ref 0–1)
EOS PCT: 2 % (ref 0–5)
Eosinophils Absolute: 0.1 10*3/uL (ref 0.0–0.7)
HEMATOCRIT: 39.3 % (ref 36.0–46.0)
Hemoglobin: 13.1 g/dL (ref 12.0–15.0)
Lymphocytes Relative: 24 % (ref 12–46)
Lymphs Abs: 1 10*3/uL (ref 0.7–4.0)
MCH: 29.5 pg (ref 26.0–34.0)
MCHC: 33.3 g/dL (ref 30.0–36.0)
MCV: 88.5 fL (ref 78.0–100.0)
MONO ABS: 0.3 10*3/uL (ref 0.1–1.0)
Monocytes Relative: 8 % (ref 3–12)
Neutro Abs: 2.7 10*3/uL (ref 1.7–7.7)
Neutrophils Relative %: 66 % (ref 43–77)
PLATELETS: 229 10*3/uL (ref 150–400)
RBC: 4.44 MIL/uL (ref 3.87–5.11)
RDW: 15 % (ref 11.5–15.5)
WBC: 4.1 10*3/uL (ref 4.0–10.5)

## 2013-11-05 LAB — TSH: TSH: 0.678 u[IU]/mL (ref 0.350–4.500)

## 2013-11-05 MED ORDER — TRIAMTERENE-HCTZ 37.5-25 MG PO CAPS: 1.0000 | ORAL_CAPSULE | Freq: Every day | ORAL | Status: DC

## 2013-11-05 NOTE — Patient Instructions (Signed)
DASH Eating Plan °DASH stands for "Dietary Approaches to Stop Hypertension." The DASH eating plan is a healthy eating plan that has been shown to reduce high blood pressure (hypertension). Additional health benefits may include reducing the risk of type 2 diabetes mellitus, heart disease, and stroke. The DASH eating plan may also help with weight loss. °WHAT DO I NEED TO KNOW ABOUT THE DASH EATING PLAN? °For the DASH eating plan, you will follow these general guidelines: °· Choose foods with a percent daily value for sodium of less than 5% (as listed on the food label). °· Use salt-free seasonings or herbs instead of table salt or sea salt. °· Check with your health care provider or pharmacist before using salt substitutes. °· Eat lower-sodium products, often labeled as "lower sodium" or "no salt added." °· Eat fresh foods. °· Eat more vegetables, fruits, and low-fat dairy products. °· Choose whole grains. Look for the word "whole" as the first word in the ingredient list. °· Choose fish and skinless chicken or turkey more often than red meat. Limit fish, poultry, and meat to 6 oz (170 g) each day. °· Limit sweets, desserts, sugars, and sugary drinks. °· Choose heart-healthy fats. °· Limit cheese to 1 oz (28 g) per day. °· Eat more home-cooked food and less restaurant, buffet, and fast food. °· Limit fried foods. °· Cook foods using methods other than frying. °· Limit canned vegetables. If you do use them, rinse them well to decrease the sodium. °· When eating at a restaurant, ask that your food be prepared with less salt, or no salt if possible. °WHAT FOODS CAN I EAT? °Seek help from a dietitian for individual calorie needs. °Grains °Whole grain or whole wheat bread. Brown rice. Whole grain or whole wheat pasta. Quinoa, bulgur, and whole grain cereals. Low-sodium cereals. Corn or whole wheat flour tortillas. Whole grain cornbread. Whole grain crackers. Low-sodium crackers. °Vegetables °Fresh or frozen vegetables  (raw, steamed, roasted, or grilled). Low-sodium or reduced-sodium tomato and vegetable juices. Low-sodium or reduced-sodium tomato sauce and paste. Low-sodium or reduced-sodium canned vegetables.  °Fruits °All fresh, canned (in natural juice), or frozen fruits. °Meat and Other Protein Products °Ground beef (85% or leaner), grass-fed beef, or beef trimmed of fat. Skinless chicken or turkey. Ground chicken or turkey. Pork trimmed of fat. All fish and seafood. Eggs. Dried beans, peas, or lentils. Unsalted nuts and seeds. Unsalted canned beans. °Dairy °Low-fat dairy products, such as skim or 1% milk, 2% or reduced-fat cheeses, low-fat ricotta or cottage cheese, or plain low-fat yogurt. Low-sodium or reduced-sodium cheeses. °Fats and Oils °Tub margarines without trans fats. Light or reduced-fat mayonnaise and salad dressings (reduced sodium). Avocado. Safflower, olive, or canola oils. Natural peanut or almond butter. °Other °Unsalted popcorn and pretzels. °The items listed above may not be a complete list of recommended foods or beverages. Contact your dietitian for more options. °WHAT FOODS ARE NOT RECOMMENDED? °Grains °White bread. White pasta. White rice. Refined cornbread. Bagels and croissants. Crackers that contain trans fat. °Vegetables °Creamed or fried vegetables. Vegetables in a cheese sauce. Regular canned vegetables. Regular canned tomato sauce and paste. Regular tomato and vegetable juices. °Fruits °Dried fruits. Canned fruit in light or heavy syrup. Fruit juice. °Meat and Other Protein Products °Fatty cuts of meat. Ribs, chicken wings, bacon, sausage, bologna, salami, chitterlings, fatback, hot dogs, bratwurst, and packaged luncheon meats. Salted nuts and seeds. Canned beans with salt. °Dairy °Whole or 2% milk, cream, half-and-half, and cream cheese. Whole-fat or sweetened yogurt. Full-fat   cheeses or blue cheese. Nondairy creamers and whipped toppings. Processed cheese, cheese spreads, or cheese  curds. °Condiments °Onion and garlic salt, seasoned salt, table salt, and sea salt. Canned and packaged gravies. Worcestershire sauce. Tartar sauce. Barbecue sauce. Teriyaki sauce. Soy sauce, including reduced sodium. Steak sauce. Fish sauce. Oyster sauce. Cocktail sauce. Horseradish. Ketchup and mustard. Meat flavorings and tenderizers. Bouillon cubes. Hot sauce. Tabasco sauce. Marinades. Taco seasonings. Relishes. °Fats and Oils °Butter, stick margarine, lard, shortening, ghee, and bacon fat. Coconut, palm kernel, or palm oils. Regular salad dressings. °Other °Pickles and olives. Salted popcorn and pretzels. °The items listed above may not be a complete list of foods and beverages to avoid. Contact your dietitian for more information. °WHERE CAN I FIND MORE INFORMATION? °National [employer], [employer], [employer] Institute: www.nhlbi.nih.gov/health/health-topics/topics/dash/ °Document Released: 02/11/2011 Document Revised: 07/09/2013 Document Reviewed: 12/27/2012 °Exit[employer]® Patient Information ©2015 [employer]. This information is not intended to replace advice given to you by your health care provider. Make sure you discuss any questions you have with your health care provider. ° °

## 2013-11-05 NOTE — Progress Notes (Signed)
Patient here to establish care Patient states used to take medication for her blood pressure But has not been on medications for over three months

## 2013-11-05 NOTE — Progress Notes (Signed)
Patient Demographics  [name], is a 53 y.o. female  ZOX:[id number]  [id number]  DOB - 28-Jun-1960  CC:  Chief Complaint  Patient presents with  . Establish Care       HPI: [name] is a 53 y.o. female here today to establish medical care.she has history of hypertension and was on blood pressure medication, for the last 3 months she has not taken the medication, her blood pressure today is elevated she denies any headache dizziness chest and shortness of breath, she also has history of GERD and is taking Protonix which helps her with the symptoms, she never had a colonoscopy done and is due for mammogram as well. Patient does complain of some arthritic symptoms and is taking Aleve as when necessary. Patient has No headache, No chest pain, No abdominal pain - No Nausea, No new weakness tingling or numbness, No Cough - SOB.  No Known Allergies Past Medical History  Diagnosis Date  . Hypertension   . OAB (overactive bladder)   . Hypercholesteremia    Current Outpatient Prescriptions on File Prior to Visit  Medication Sig Dispense Refill  . pantoprazole (PROTONIX) 20 MG tablet Take 1 tablet (20 mg total) by mouth daily.  30 tablet  1   No current facility-administered medications on file prior to visit.   Family History  Problem Relation Age of Onset  . Hypertension Mother   . Diabetes Mother   . Cancer Father    History   Social History  . Marital Status: Single    Spouse Name: N/A    Number of Children: N/A  . Years of Education: N/A   Occupational History  . Not on file.   Social History Main Topics  . Smoking status: Never Smoker   . Smokeless tobacco: Not on file  . Alcohol Use: Yes     Comment: occasonl  . Drug Use: 2.00 per week    Special: Marijuana  . Sexual Activity: Yes    Birth Control/ Protection: Surgical   Other Topics Concern  . Not on file   Social History Narrative  . No narrative on file    Review of  Systems: Constitutional: Negative for fever, chills, diaphoresis, activity change, appetite change and fatigue. HENT: Negative for ear pain, nosebleeds, congestion, facial swelling, rhinorrhea, neck pain, neck stiffness and ear discharge.  Eyes: Negative for pain, discharge, redness, itching and visual disturbance. Respiratory: Negative for cough, choking, chest tightness, shortness of breath, wheezing and stridor.  Cardiovascular: Negative for chest pain, palpitations and leg swelling. Gastrointestinal: Negative for abdominal distention. Genitourinary: Negative for dysuria, urgency, frequency, hematuria, flank pain, decreased urine volume, difficulty urinating and dyspareunia.  Musculoskeletal: Negative for back pain, joint swelling, arthralgia and gait problem. Neurological: Negative for dizziness, tremors, seizures, syncope, facial asymmetry, speech difficulty, weakness, light-headedness, numbness and headaches.  Hematological: Negative for adenopathy. Does not bruise/bleed easily. Psychiatric/Behavioral: Negative for hallucinations, behavioral problems, confusion, dysphoric mood, decreased concentration and agitation.    Objective:   Filed Vitals:   11/05/13 1003  BP: 145/85  Pulse: 65  Temp: 98.4 F (36.9 C)  Resp: 15    Physical Exam: Constitutional: Patient appears well-developed and well-nourished. No distress. HENT: Normocephalic, atraumatic, External right and left ear normal. Oropharynx is clear and moist.  Eyes: Conjunctivae and EOM are normal. PERRLA, no scleral icterus. Neck: Normal ROM. Neck supple. No JVD. No tracheal deviation. No thyromegaly. CVS: RRR, S1/S2 +, no murmurs, no gallops, no carotid bruit.  Pulmonary:  Effort and breath sounds normal, no stridor, rhonchi, wheezes, rales.  Abdominal: Soft. BS +, no distension, tenderness, rebound or guarding.  Musculoskeletal: Normal range of motion. No edema and no tenderness.  Neuro: Alert. Normal reflexes, muscle tone  coordination. No cranial nerve deficit. Skin: Skin is warm and dry. No rash noted. Not diaphoretic. No erythema. No pallor. Psychiatric: Normal mood and affect. Behavior, judgment, thought content normal.  Lab Results  Component Value Date   WBC 5.8 02/19/2009   HGB 13.1 02/19/2009   HCT 38.1 02/19/2009   MCV 89.2 02/19/2009   PLT 226 02/19/2009   Lab Results  Component Value Date   CREATININE 0.97 02/19/2009   BUN 17 02/19/2009   NA 135 02/19/2009   K 4.5 02/19/2009   CL 102 02/19/2009   CO2 22 02/19/2009    Lab Results  Component Value Date   HGBA1C 5.6 02/19/2009   Lipid Panel     Component Value Date/Time   CHOL 201* 03/11/2009 2114   TRIG 93 03/11/2009 2114   HDL 85 03/11/2009 2114   CHOLHDL 2.4 Ratio 03/11/2009 2114   VLDL 19 03/11/2009 2114   LDLCALC 97 03/11/2009 2114       Assessment and plan:   1. HYPERTENSION, BENIGN ESSENTIAL Patient is advised for DASH diet, Resume back on Dyazide - triamterene-hydrochlorothiazide (DYAZIDE) 37.5-25 MG per capsule; Take 1 each (1 capsule total) by mouth daily.  Dispense: 30 capsule; Refill: 3  2. Gastroesophageal reflux disease, esophagitis presence not specified History modification, continue with Protonix.  3. Screening We'll do baseline blood work. - CBC with Differential - COMPLETE METABOLIC PANEL WITH GFR - TSH - Lipid panel - Vit D  25 hydroxy (rtn osteoporosis monitoring) - MM DIGITAL SCREENING BILATERAL; Future  4. Special screening for malignant neoplasms, colon Ambulatory referral to Gastroenterology         Health Maintenance -Colonoscopy: referred to GI  -Mammogram: ordered  Return in about 3 months (around 02/04/2014) for hypertension, gerd.   Lorayne Marek, MD

## 2013-11-06 LAB — VITAMIN D 25 HYDROXY (VIT D DEFICIENCY, FRACTURES): Vit D, 25-Hydroxy: 13 ng/mL — ABNORMAL LOW (ref 30–89)

## 2013-11-07 ENCOUNTER — Telehealth: Payer: Self-pay

## 2013-11-07 MED ORDER — VITAMIN D (ERGOCALCIFEROL) 1.25 MG (50000 UNIT) PO CAPS: 50000.0000 [IU] | ORAL_CAPSULE | ORAL | Status: DC

## 2013-11-07 NOTE — Telephone Encounter (Signed)
Patient not available Unable to leave message

## 2013-11-07 NOTE — Telephone Encounter (Signed)
Message copied by Dorothe Pea on Wed Nov 07, 2013  2:25 PM ------      Message from: Lorayne Marek      Created: Tue Nov 06, 2013  1:04 PM       Blood work reviewed noticed impaired fasting glucose, call and advise patient for low carbohydrate diet.       noticed low vitamin D, call patient advise to start ergocalciferol 50,000 units once a week for the duration of  12 weeks.       ------

## 2013-11-15 ENCOUNTER — Ambulatory Visit (HOSPITAL_COMMUNITY): Payer: No Typology Code available for payment source

## 2013-12-17 ENCOUNTER — Ambulatory Visit: Payer: Self-pay | Attending: Internal Medicine

## 2014-02-09 ENCOUNTER — Emergency Department (HOSPITAL_COMMUNITY): Payer: Self-pay

## 2014-02-09 ENCOUNTER — Encounter (HOSPITAL_COMMUNITY): Payer: Self-pay | Admitting: *Deleted

## 2014-02-09 ENCOUNTER — Emergency Department (HOSPITAL_COMMUNITY)
Admission: EM | Admit: 2014-02-09 | Discharge: 2014-02-09 | Disposition: A | Discharge status: Home / Self Care | Payer: Self-pay | Attending: Emergency Medicine | Admitting: Emergency Medicine

## 2014-02-09 DIAGNOSIS — I10 Essential (primary) hypertension: Secondary | ICD-10-CM | POA: Insufficient documentation

## 2014-02-09 DIAGNOSIS — S8991XA Unspecified injury of right lower leg, initial encounter: Secondary | ICD-10-CM | POA: Insufficient documentation

## 2014-02-09 DIAGNOSIS — Z79899 Other long term (current) drug therapy: Secondary | ICD-10-CM | POA: Insufficient documentation

## 2014-02-09 DIAGNOSIS — Z87448 Personal history of other diseases of urinary system: Secondary | ICD-10-CM | POA: Insufficient documentation

## 2014-02-09 DIAGNOSIS — Y9289 Other specified places as the place of occurrence of the external cause: Secondary | ICD-10-CM | POA: Insufficient documentation

## 2014-02-09 DIAGNOSIS — Z8639 Personal history of other endocrine, nutritional and metabolic disease: Secondary | ICD-10-CM | POA: Insufficient documentation

## 2014-02-09 DIAGNOSIS — R52 Pain, unspecified: Secondary | ICD-10-CM

## 2014-02-09 DIAGNOSIS — X58XXXA Exposure to other specified factors, initial encounter: Secondary | ICD-10-CM | POA: Insufficient documentation

## 2014-02-09 DIAGNOSIS — M79609 Pain in unspecified limb: Secondary | ICD-10-CM

## 2014-02-09 DIAGNOSIS — Y998 Other external cause status: Secondary | ICD-10-CM | POA: Insufficient documentation

## 2014-02-09 DIAGNOSIS — Y9389 Activity, other specified: Secondary | ICD-10-CM | POA: Insufficient documentation

## 2014-02-09 MED ORDER — OXYCODONE-ACETAMINOPHEN 5-325 MG PO TABS: 1.0000 | ORAL_TABLET | Freq: Four times a day (QID) | ORAL | Status: DC | PRN

## 2014-02-09 MED ORDER — OXYCODONE-ACETAMINOPHEN 5-325 MG PO TABS
1.0000 | ORAL_TABLET | Freq: Once | ORAL | Status: AC
  Administered 2014-02-09: 1 via ORAL
  Filled 2014-02-09: qty 1

## 2014-02-09 MED ORDER — IBUPROFEN 600 MG PO TABS: 600.0000 mg | ORAL_TABLET | Freq: Four times a day (QID) | ORAL | Status: DC | PRN

## 2014-02-09 MED ORDER — ONDANSETRON 4 MG PO TBDP
4.0000 mg | ORAL_TABLET | Freq: Once | ORAL | Status: AC
  Administered 2014-02-09: 4 mg via ORAL
  Filled 2014-02-09: qty 1

## 2014-02-09 NOTE — Discharge Instructions (Signed)
The x-ray of your knee has come back showing no fractures and no dislocations. The ultrasound that was done showed that you do not have any blood clots but there is concern for possible muscle tear injury. It is recommended that you use the knee immobilizer whenever you will be putting any pressure on your leg. The crutches are for comfort. The treatment for this injury is immobilization, anti-inflammatory medicines such as ibuprofen, Motrin, Aleve. I have also written you Percocet to help with the pain if ibuprofen is not strong enough. Please be careful when taking Percocet as it will cause sedation and drowsiness. You will need to follow-up with an orthopedic doctor. A referral has been done for you here today. The nurse will show you where the doctor's name, address and phone number is on your discharge paperwork.  You will need to call the office and schedule an appointment. I know that you were not doing anything strenuous, nor did you fall or have any injury to the leg. Unfortunately, sometimes muscles can get inured with repetitive small injuries which can occur and make this happened. The Orthopedic doctor may want to do physical therapy or get an MRI. If you develop any new symptoms or if pain cannot be controlled please return to the emergency department as needed.  Knee Immobilizer A knee immobilizer is used to support and protect an injured or painful knee. Knee immobilizers keep your knee from being used while it is healing. Some of the common immobilizers used include splints (air, plaster, fiberglass, stiff cloth, or aluminum) or casts. Wear your knee immobilizer as instructed and only remove it as instructed. HOME CARE INSTRUCTIONS   Use absorbent powder (such as baby powder or talcum powder) to control irritation from sweat and friction.  Adjust the immobilizer to be firm but not tight. Signs of an immobilizer that is too tight include:  Swelling.  Numbness.  Color change in your foot  or ankle.  Increased pain.  While resting, raise your leg above the level of your heart. Pillows can be used for support. This reduces throbbing and helps healing.  Remove the immobilizer to bathe and sleep. SEEK MEDICAL CARE IF:   You have increasing pain or swelling in the knee, foot, or ankle.  You have problems caused by the knee immobilizer, or it breaks or needs replacement. MAKE SURE YOU:   Understand these instructions.  Will watch your condition.  Will get help right away if you are not doing well or get worse. Document Released: 02/22/2005 Document Revised: 07/09/2013 Document Reviewed: 10/16/2012 [facility] Patient Information 2015 [place], [place]. This information is not intended to replace advice given to you by your health care provider. Make sure you discuss any questions you have with your health care provider.

## 2014-02-09 NOTE — Progress Notes (Signed)
VASCULAR LAB PRELIMINARY  PRELIMINARY  PRELIMINARY  PRELIMINARY  Right lower extremity venous Doppler completed.    Preliminary report:  There is no DVT or SVT noted in the right lower extremity.  There is disruption in the muscle tissue in the mid calf at the site of pain that could possibly be consistent with a muscle tear.  [name], RVT 02/09/2014, 6:20 PM
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
## 2014-02-09 NOTE — ED Notes (Signed)
Pt reports standing yesterday and "felt something pop in right lower leg." now has pain and swelling to right calf and bruising noted to lower anterior leg.

## 2014-03-06 ENCOUNTER — Ambulatory Visit: Payer: Self-pay | Admitting: Internal Medicine

## 2014-05-23 ENCOUNTER — Ambulatory Visit (HOSPITAL_BASED_OUTPATIENT_CLINIC_OR_DEPARTMENT_OTHER): Payer: Self-pay | Admitting: Internal Medicine

## 2014-05-23 ENCOUNTER — Encounter: Payer: Self-pay | Admitting: Internal Medicine

## 2014-05-23 ENCOUNTER — Other Ambulatory Visit: Payer: Self-pay | Admitting: Internal Medicine

## 2014-05-23 VITALS — BP 122/78 | HR 70 | Temp 98.0°F | Resp 16 | Wt 212.2 lb

## 2014-05-23 DIAGNOSIS — Z1211 Encounter for screening for malignant neoplasm of colon: Secondary | ICD-10-CM

## 2014-05-23 DIAGNOSIS — I10 Essential (primary) hypertension: Secondary | ICD-10-CM

## 2014-05-23 DIAGNOSIS — M62830 Muscle spasm of back: Secondary | ICD-10-CM

## 2014-05-23 DIAGNOSIS — R7301 Impaired fasting glucose: Secondary | ICD-10-CM

## 2014-05-23 DIAGNOSIS — Z1231 Encounter for screening mammogram for malignant neoplasm of breast: Secondary | ICD-10-CM

## 2014-05-23 DIAGNOSIS — K219 Gastro-esophageal reflux disease without esophagitis: Secondary | ICD-10-CM

## 2014-05-23 MED ORDER — OMEPRAZOLE 20 MG PO CPDR: 20.0000 mg | DELAYED_RELEASE_CAPSULE | Freq: Every day | ORAL | Status: DC

## 2014-05-23 MED ORDER — TRIAMTERENE-HCTZ 37.5-25 MG PO CAPS: 1.0000 | ORAL_CAPSULE | Freq: Every day | ORAL | Status: DC

## 2014-05-23 MED ORDER — CYCLOBENZAPRINE HCL 10 MG PO TABS: 10.0000 mg | ORAL_TABLET | Freq: Every day | ORAL | Status: DC

## 2014-05-23 NOTE — Progress Notes (Signed)
Patient here for follow up Requesting referral to eye doctor Complains of back pain , cough and itchy throat

## 2014-05-23 NOTE — Progress Notes (Signed)
MRN: [id number] Name: [name]  Sex: female Age: 54 y.o. DOB: May 06, 1960  Allergies: Review of patient's allergies indicates no known allergies.  Chief Complaint  Patient presents with  . Follow-up    HPI: Patient is 54 y.o. female who has to of hypertension, GERD, in the past she was prescribed Protonix as per patient she is not taking that has been compliant with her blood pressure medication and is requesting refill, she's also complaining of occasional dry cough denies any fever chills, also reported to have some lower back discomfort denies any recent fall or trauma patient has taken Aleve which did not help  much.  Past Medical History  Diagnosis Date  . Hypertension   . OAB (overactive bladder)   . Hypercholesteremia     Past Surgical History  Procedure Laterality Date  . Abdominal hysterectomy        Medication List       This list is accurate as of: 05/23/14 11:24 AM.  Always use your most recent med list.               cyclobenzaprine 10 MG tablet  Commonly known as:  FLEXERIL  Take 1 tablet (10 mg total) by mouth at bedtime.     ibuprofen 600 MG tablet  Commonly known as:  ADVIL,MOTRIN  Take 1 tablet (600 mg total) by mouth every 6 (six) hours as needed.     omeprazole 20 MG capsule  Commonly known as:  PRILOSEC  Take 1 capsule (20 mg total) by mouth daily.     oxyCODONE-acetaminophen 5-325 MG per tablet  Commonly known as:  PERCOCET/ROXICET  Take 1 tablet by mouth every 6 (six) hours as needed for severe pain.     pantoprazole 20 MG tablet  Commonly known as:  PROTONIX  Take 1 tablet (20 mg total) by mouth daily.     triamterene-hydrochlorothiazide 37.5-25 MG per capsule  Commonly known as:  DYAZIDE  Take 1 each (1 capsule total) by mouth daily.     Vitamin D (Ergocalciferol) 50000 UNITS Caps capsule  Commonly known as:  DRISDOL  Take 1 capsule (50,000 Units total) by mouth every 7 (seven) days.        Meds ordered this  encounter  Medications  . omeprazole (PRILOSEC) 20 MG capsule    Sig: Take 1 capsule (20 mg total) by mouth daily.    Dispense:  30 capsule    Refill:  3  . cyclobenzaprine (FLEXERIL) 10 MG tablet    Sig: Take 1 tablet (10 mg total) by mouth at bedtime.    Dispense:  30 tablet    Refill:  1  . triamterene-hydrochlorothiazide (DYAZIDE) 37.5-25 MG per capsule    Sig: Take 1 each (1 capsule total) by mouth daily.    Dispense:  30 capsule    Refill:  3    Immunization History  Administered Date(s) Administered  . Td 03/08/2004    Family History  Problem Relation Age of Onset  . Hypertension Mother   . Diabetes Mother   . Cancer Father     History  Substance Use Topics  . Smoking status: Never Smoker   . Smokeless tobacco: Not on file  . Alcohol Use: Yes     Comment: occasonl    Review of Systems   As noted in HPI  Filed Vitals:   05/23/14 1002  BP: 122/78  Pulse: 70  Temp: 98 F (36.7 C)  Resp: 16  Physical Exam  Physical Exam  HENT:  Minimal nasal congestion no sinus tenderness  Eyes: EOM are normal. Pupils are equal, round, and reactive to light.  Neck: Neck supple.  Cardiovascular: Normal rate and regular rhythm.   Pulmonary/Chest: Breath sounds normal. No respiratory distress. She has no wheezes. She has no rales.  Musculoskeletal:  Minimal lower lumbar paraspinal tenderness    CBC    Component Value Date/Time   WBC 4.1 11/05/2013 1039   RBC 4.44 11/05/2013 1039   HGB 13.1 11/05/2013 1039   HCT 39.3 11/05/2013 1039   PLT 229 11/05/2013 1039   MCV 88.5 11/05/2013 1039   LYMPHSABS 1.0 11/05/2013 1039   MONOABS 0.3 11/05/2013 1039   EOSABS 0.1 11/05/2013 1039   BASOSABS 0.0 11/05/2013 1039    CMP     Component Value Date/Time   NA 140 11/05/2013 1039   K 4.1 11/05/2013 1039   CL 106 11/05/2013 1039   CO2 23 11/05/2013 1039   GLUCOSE 118* 11/05/2013 1039   BUN 20 11/05/2013 1039   CREATININE 0.69 11/05/2013 1039   CREATININE 0.97  02/19/2009 2110   CALCIUM 9.8 11/05/2013 1039   PROT 7.2 11/05/2013 1039   ALBUMIN 4.5 11/05/2013 1039   AST 16 11/05/2013 1039   ALT 16 11/05/2013 1039   ALKPHOS 60 11/05/2013 1039   BILITOT 0.7 11/05/2013 1039   GFRNONAA >89 11/05/2013 1039   GFRAA >89 11/05/2013 1039    Lab Results  Component Value Date/Time   CHOL 208* 11/05/2013 10:39 AM    No components found for: HGA1C  Lab Results  Component Value Date/Time   AST 16 11/05/2013 10:39 AM    Assessment and Plan  HYPERTENSION, BENIGN ESSENTIAL - Plan: blood pressure is well controlled, continue with current meds triamterene-hydrochlorothiazide (DYAZIDE) 37.5-25 MG per capsule  Gastroesophageal reflux disease, esophagitis presence not specified - Plan: patient was on Protonix which as per patient did not help, I have prescribed omeprazole (PRILOSEC) 20 MG capsule, Ambulatory referral to Gastroenterology  IFG (impaired fasting glucose) Advised patient for low carbohydrate diet.  Encounter for screening mammogram for breast cancer - Plan: MM DIGITAL SCREENING BILATERAL  Special screening for malignant neoplasms, colon Referred to GI  Back muscle spasm - Plan: cyclobenzaprine (FLEXERIL) 10 MG tablet   Health Maintenance -Colonoscopy: referred to GI -Mammogram:ordered   Return in about 3 months (around 08/23/2014) for hypertension.   This note has been created with Surveyor, quantity. Any transcriptional errors are unintentional.    [name], [name]

## 2014-06-20 ENCOUNTER — Ambulatory Visit: Payer: Self-pay

## 2014-11-03 ENCOUNTER — Encounter (HOSPITAL_COMMUNITY): Payer: Self-pay

## 2014-11-03 ENCOUNTER — Emergency Department (HOSPITAL_COMMUNITY)
Admission: EM | Admit: 2014-11-03 | Discharge: 2014-11-03 | Disposition: A | Discharge status: Home / Self Care | Payer: Self-pay | Attending: Emergency Medicine | Admitting: Emergency Medicine

## 2014-11-03 ENCOUNTER — Emergency Department (HOSPITAL_COMMUNITY): Payer: Self-pay

## 2014-11-03 DIAGNOSIS — I10 Essential (primary) hypertension: Secondary | ICD-10-CM | POA: Insufficient documentation

## 2014-11-03 DIAGNOSIS — Z8639 Personal history of other endocrine, nutritional and metabolic disease: Secondary | ICD-10-CM | POA: Insufficient documentation

## 2014-11-03 DIAGNOSIS — Z79899 Other long term (current) drug therapy: Secondary | ICD-10-CM | POA: Insufficient documentation

## 2014-11-03 DIAGNOSIS — R079 Chest pain, unspecified: Secondary | ICD-10-CM | POA: Insufficient documentation

## 2014-11-03 DIAGNOSIS — R0781 Pleurodynia: Secondary | ICD-10-CM

## 2014-11-03 MED ORDER — HYDROCODONE-ACETAMINOPHEN 5-325 MG PO TABS: 1.0000 | ORAL_TABLET | Freq: Three times a day (TID) | ORAL | Status: DC | PRN

## 2014-11-03 MED ORDER — IBUPROFEN 200 MG PO TABS
400.0000 mg | ORAL_TABLET | Freq: Once | ORAL | Status: AC
  Administered 2014-11-03: 400 mg via ORAL
  Filled 2014-11-03: qty 2

## 2014-11-03 MED ORDER — IBUPROFEN 400 MG PO TABS: 400.0000 mg | ORAL_TABLET | Freq: Three times a day (TID) | ORAL | Status: AC

## 2014-11-03 MED ORDER — HYDROCODONE-ACETAMINOPHEN 5-325 MG PO TABS
1.0000 | ORAL_TABLET | Freq: Once | ORAL | Status: AC
  Administered 2014-11-03: 1 via ORAL
  Filled 2014-11-03: qty 1

## 2014-11-03 NOTE — ED Notes (Signed)
Patient transported to X-ray 

## 2014-11-03 NOTE — ED Provider Notes (Signed)
CSN: [id number]     Arrival date & time 11/03/14  0947 History   First MD Initiated Contact with Patient 11/03/14 [phone]     Chief Complaint  Patient presents with  . Rib Injury     (Consider location/radiation/quality/duration/timing/severity/associated sxs/prior Treatment) Patient is a 54 y.o. female presenting with chest pain.  Chest Pain Pain location:  R chest and R lateral chest Pain quality: sharp   Pain radiates to the back: no   Pain severity:  Severe Onset quality:  Sudden Duration:  3 days Timing:  Intermittent Progression:  Waxing and waning Chronicity:  New Context: breathing, movement and raising an arm   Relieved by:  None tried Worsened by:  Nothing tried Ineffective treatments: tylenol and ibuprofen. Associated symptoms: back pain (right paraspinal)   Associated symptoms: no abdominal pain, no cough, no fever, no nausea and no shortness of breath     Past Medical History  Diagnosis Date  . Hypertension   . OAB (overactive bladder)   . Hypercholesteremia    Past Surgical History  Procedure Laterality Date  . Abdominal hysterectomy     Family History  Problem Relation Age of Onset  . Hypertension Mother   . Diabetes Mother   . Cancer Father    Social History  Substance Use Topics  . Smoking status: Never Smoker   . Smokeless tobacco: None  . Alcohol Use: Yes     Comment: occasonl   OB History    No data available     Review of Systems  Constitutional: Negative for fever.  Respiratory: Negative for cough, chest tightness and shortness of breath.   Cardiovascular: Positive for chest pain (right sided).  Gastrointestinal: Negative for nausea and abdominal pain.  Endocrine: Negative for polydipsia and polyuria.  Genitourinary: Negative for pelvic pain.  Musculoskeletal: Positive for back pain (right paraspinal).  All other systems reviewed and are negative.     Allergies  Review of patient's allergies indicates no known allergies.  Home  Medications   Prior to Admission medications   Medication Sig Start Date End Date Taking? Authorizing Provider  omeprazole (PRILOSEC) 20 MG capsule Take 1 capsule (20 mg total) by mouth daily. 05/23/14  Yes Lorayne Marek, MD  triamterene-hydrochlorothiazide (DYAZIDE) 37.5-25 MG per capsule Take 1 each (1 capsule total) by mouth daily. 05/23/14  Yes Lorayne Marek, MD  HYDROcodone-acetaminophen (NORCO/VICODIN) 5-325 MG per tablet Take 1 tablet by mouth every 8 (eight) hours as needed for severe pain. 11/03/14   Merrily Pew, MD  ibuprofen (ADVIL,MOTRIN) 400 MG tablet Take 1 tablet (400 mg total) by mouth 3 (three) times daily. 11/03/14 11/17/14  Corene Cornea Quintasia Theroux, MD   BP 162/82 mmHg  Pulse 61  Temp(Src) 98.3 F (36.8 C) (Oral)  Resp 16  SpO2 99% Physical Exam  Constitutional: She is oriented to person, place, and time. She appears well-developed and well-nourished.  HENT:  Head: Normocephalic and atraumatic.  Eyes: Conjunctivae and EOM are normal. Right eye exhibits no discharge. Left eye exhibits no discharge.  Cardiovascular: Normal rate and regular rhythm.   Pulmonary/Chest: Effort normal and breath sounds normal. No respiratory distress. She exhibits tenderness (right 9-10th ribs).  Abdominal: Soft. She exhibits no distension. There is no tenderness. There is no rebound.  Musculoskeletal: Normal range of motion. She exhibits no edema or tenderness.  Neurological: She is alert and oriented to person, place, and time.  Skin: Skin is warm and dry.  Nursing note and vitals reviewed.   ED Course  Procedures (  including critical care time) Labs Review Labs Reviewed - No data to display  Imaging Review Dg Chest 1 View  11/03/2014   CLINICAL DATA:  Right-sided trauma 3 days ago.  Initial encounter.  EXAM: CHEST  1 VIEW  COMPARISON:  Chest and rib films of same date. Prior chest radiograph of 05/16/2006  FINDINGS: Single lateral view of the chest demonstrates normal heart size and clear lungs. No  pleural fluid. Maintenance of thoracic vertebral body height.  IMPRESSION: No acute findings.   Electronically Signed   By: Abigail Miyamoto M.D.   On: 11/03/2014 08:15   Dg Ribs Unilateral W/chest Right  11/03/2014   CLINICAL DATA:  Trauma to right ribs 3 days ago. Pain. Initial encounter.  EXAM: RIGHT RIBS AND CHEST - 3+ VIEW  COMPARISON:  05/16/2006 chest radiograph  FINDINGS: Frontal view of the chest and four views of right-sided ribs. Frontal view of the chest demonstrates midline trachea. Normal heart size and mediastinal contours. No pleural effusion or pneumothorax. Clear lungs.  Three views of right-sided ribs demonstrate no displaced fracture.  IMPRESSION: No acute findings.   Electronically Signed   By: Abigail Miyamoto M.D.   On: 11/03/2014 08:14   I have personally reviewed and evaluated these images and lab results as part of my medical decision-making.   EKG Interpretation None      MDM   Final diagnoses:  Rib pain on right side   Messing around the other day. Several possible rib fracture. We'll treat accordingly evenx-rays are negative. She has a pleuritic component which is likely secondary to inflammation however I will get a chest x-ray to ensure she does not have a pneumothorax. She has bilobar breath sounds so doubt this.  xr's negative for obvious fracture. I will treat her for occult rib fracture. I discussed with her that timeframe of resolution of symptoms. Also discussed with her respiratory measures to avoid pneumonia. Patient's pain improved in the emergency department prescription given for the same medications.  I have personally and contemperaneously reviewed labs and imaging and used in my decision making as above.   A medical screening exam was performed and I feel the patient has had an appropriate workup for their chief complaint at this time and likelihood of emergent condition existing is low. They have been counseled on decision, discharge, follow up and which  symptoms necessitate immediate return to the emergency department. They or their family verbally stated understanding and agreement with plan and discharged in stable condition.        Merrily Pew, MD 11/04/14 [phone]

## 2014-11-03 NOTE — ED Notes (Signed)
Per EMS - pt wrestling 3 days ago, knee to right rib cage. Pt c/o worsening pain x 3 days that is worse w/ coughing. Also c/o back pain. Took 2 tylenol prior to EMS arrival. VSS - 162/88, 70bpm, rr 18. Respirations even/unlabored. C/o 8/10 pain.

## 2014-11-04 ENCOUNTER — Ambulatory Visit: Payer: Self-pay | Attending: Internal Medicine

## 2014-12-09 ENCOUNTER — Telehealth: Payer: Self-pay | Admitting: Internal Medicine

## 2014-12-09 NOTE — Telephone Encounter (Signed)
error 

## 2015-03-18 ENCOUNTER — Ambulatory Visit: Payer: Self-pay | Attending: Internal Medicine | Admitting: Internal Medicine

## 2015-03-18 ENCOUNTER — Encounter: Payer: Self-pay | Admitting: Internal Medicine

## 2015-03-18 VITALS — BP 129/83 | HR 76 | Temp 98.0°F | Resp 16 | Ht 69.0 in | Wt 221.0 lb

## 2015-03-18 DIAGNOSIS — E669 Obesity, unspecified: Secondary | ICD-10-CM | POA: Insufficient documentation

## 2015-03-18 DIAGNOSIS — M170 Bilateral primary osteoarthritis of knee: Secondary | ICD-10-CM | POA: Insufficient documentation

## 2015-03-18 DIAGNOSIS — M79642 Pain in left hand: Secondary | ICD-10-CM | POA: Insufficient documentation

## 2015-03-18 DIAGNOSIS — Z79899 Other long term (current) drug therapy: Secondary | ICD-10-CM | POA: Insufficient documentation

## 2015-03-18 DIAGNOSIS — I10 Essential (primary) hypertension: Secondary | ICD-10-CM | POA: Insufficient documentation

## 2015-03-18 DIAGNOSIS — K219 Gastro-esophageal reflux disease without esophagitis: Secondary | ICD-10-CM | POA: Insufficient documentation

## 2015-03-18 MED ORDER — TRIAMTERENE-HCTZ 37.5-25 MG PO CAPS: 1.0000 | ORAL_CAPSULE | Freq: Every day | ORAL | Status: DC

## 2015-03-18 MED ORDER — OMEPRAZOLE 20 MG PO CPDR: 20.0000 mg | DELAYED_RELEASE_CAPSULE | Freq: Every day | ORAL | Status: DC

## 2015-03-18 MED FILL — ?TRIAMTERENE/HCTZ 37.5/25TB: 37.5-25 | 30 days supply | Qty: 30 | Fill #0

## 2015-03-18 MED FILL — ?OMEPRAZOLE DR 20 MG CAPSUL: 20 | 30 days supply | Qty: 30 | Fill #0

## 2015-03-18 NOTE — Progress Notes (Signed)
Patient here to re establish care and for medication refills

## 2015-03-18 NOTE — Progress Notes (Signed)
Patient ID: [name], female   DOB: December 04, 1960, 55 y.o.   MRN: [id number]  CC: establish care  HPI: [name] is a 55 y.o. female here today for a follow up visit.  Patient has past medical history of hypertension and GERD. Patient reports that she has been out of her blood pressure medication for 2 weeks and needs refills. She does not check her pressures at home and has no c/o chest pain, palpitations, or headaches.  She reports left palmar pain for 2 years. She states that it started after cooking at work, she denies injury. Pain is below thumb with slightly swelling. No numbness or wrist pain. Pain is a aching sensation. She also reports bilateral knee pain that is achy and has been present for a couple of months. Worse since cold weather. Unable to walk up stairs and sit on low toilets. When sitting in a low position for a extended period of time it is hard for her to get up.   No Known Allergies Past Medical History  Diagnosis Date  . Hypertension   . OAB (overactive bladder)   . Hypercholesteremia    Current Outpatient Prescriptions on File Prior to Visit  Medication Sig Dispense Refill  . omeprazole (PRILOSEC) 20 MG capsule Take 1 capsule (20 mg total) by mouth daily. 30 capsule 3  . triamterene-hydrochlorothiazide (DYAZIDE) 37.5-25 MG per capsule Take 1 each (1 capsule total) by mouth daily. 30 capsule 3  . HYDROcodone-acetaminophen (NORCO/VICODIN) 5-325 MG per tablet Take 1 tablet by mouth every 8 (eight) hours as needed for severe pain. 30 tablet 0  . [DISCONTINUED] pantoprazole (PROTONIX) 20 MG tablet Take 1 tablet (20 mg total) by mouth daily. (Patient not taking: Reported on 02/09/2014) 30 tablet 1   No current facility-administered medications on file prior to visit.   Family History  Problem Relation Age of Onset  . Hypertension Mother   . Diabetes Mother   . Cancer Father    Social History   Social History  . Marital Status: Single    Spouse Name: N/A  .  Number of Children: N/A  . Years of Education: N/A   Occupational History  . Not on file.   Social History Main Topics  . Smoking status: Never Smoker   . Smokeless tobacco: Not on file  . Alcohol Use: Yes     Comment: occasonl  . Drug Use: 2.00 per week    Special: Marijuana  . Sexual Activity: Yes    Birth Control/ Protection: Surgical   Other Topics Concern  . Not on file   Social History Narrative    Review of Systems: Other than what is stated in HPI, all other systems are negative.   Objective:   Filed Vitals:   03/18/15 1559  BP: 129/83  Pulse: 76  Temp: 98 F (36.7 C)  Resp: 16    Physical Exam  Constitutional: She is oriented to person, place, and time.  Cardiovascular: Normal rate, regular rhythm and normal heart sounds.   Pulmonary/Chest: Effort normal and breath sounds normal.  Musculoskeletal: She exhibits no edema or tenderness.  Crepitus with bilateral knee ROM Moderate left thumb swelling, full ROM  Neurological: She is alert and oriented to person, place, and time.  Skin: Skin is warm and dry.  Psychiatric: She has a normal mood and affect.     Lab Results  Component Value Date   WBC 4.1 11/05/2013   HGB 13.1 11/05/2013   HCT 39.3 11/05/2013  MCV 88.5 11/05/2013   PLT 229 11/05/2013   Lab Results  Component Value Date   CREATININE 0.69 11/05/2013   BUN 20 11/05/2013   NA 140 11/05/2013   K 4.1 11/05/2013   CL 106 11/05/2013   CO2 23 11/05/2013    Lab Results  Component Value Date   HGBA1C 5.6 02/19/2009   Lipid Panel     Component Value Date/Time   CHOL 208* 11/05/2013 1039   TRIG 138 11/05/2013 1039   HDL 87 11/05/2013 1039   CHOLHDL 2.4 11/05/2013 1039   VLDL 28 11/05/2013 1039   LDLCALC 93 11/05/2013 1039       Assessment and plan:   Jamiaya was seen today for establish care.  Diagnoses and all orders for this visit:  HYPERTENSION, BENIGN ESSENTIAL -     Refilled triamterene-hydrochlorothiazide (DYAZIDE)  37.5-25 MG capsule; Take 1 each (1 capsule total) by mouth daily. -     Lipid panel; Future -     COMPLETE METABOLIC PANEL WITH GFR; Future Advised patient to check pressures at local pharmacy. May be able to come off med with diet changes, exercise, and weight loss.   Gastroesophageal reflux disease, esophagitis presence not specified -     Refilled omeprazole (PRILOSEC) 20 MG capsule; Take 1 capsule (20 mg total) by mouth daily. Discussed diet and weight with patient relating to acid reflux.  Went over things that may exacerbate acid reflux such as tomatoes, spicy foods, coffee, carbonated beverages, chocolates, etc.  Advised patient to avoid laying down at least two hours after meals and sleep with HOB elevated.   Osteoarthritis of both knees, unspecified osteoarthritis type -     Ambulatory referral to Orthopedic Surgery  Left hand pain May take ibuprofen and use heat. If no improvement after therapy may send referral for Ortho. I feel there is no need for x-rays at this time.   Obesity -     Hemoglobin A1c; Future Explained that weight loss may help knee pain significantly.    Return for Lab Visit.       Lance Bosch, [facility] [phone] 03/18/2015, 4:43 PM

## 2015-03-24 ENCOUNTER — Ambulatory Visit: Payer: Self-pay | Attending: Internal Medicine

## 2015-03-24 DIAGNOSIS — E669 Obesity, unspecified: Secondary | ICD-10-CM

## 2015-03-24 DIAGNOSIS — I10 Essential (primary) hypertension: Secondary | ICD-10-CM

## 2015-03-24 LAB — HEMOGLOBIN A1C
HEMOGLOBIN A1C: 6.2 % — AB (ref <5.7)
MEAN PLASMA GLUCOSE: 131 mg/dL — AB (ref <117)

## 2015-03-25 ENCOUNTER — Telehealth: Payer: Self-pay

## 2015-03-25 ENCOUNTER — Telehealth: Payer: Self-pay | Admitting: Internal Medicine

## 2015-03-25 LAB — COMPLETE METABOLIC PANEL WITH GFR
ALBUMIN: 4.8 g/dL (ref 3.6–5.1)
ALK PHOS: 62 U/L (ref 33–130)
ALT: 16 U/L (ref 6–29)
AST: 16 U/L (ref 10–35)
BILIRUBIN TOTAL: 0.5 mg/dL (ref 0.2–1.2)
BUN: 13 mg/dL (ref 7–25)
CO2: 24 mmol/L (ref 20–31)
Calcium: 10 mg/dL (ref 8.6–10.4)
Chloride: 102 mmol/L (ref 98–110)
Creat: 0.7 mg/dL (ref 0.50–1.05)
GFR, Est African American: >89 mL/min (ref >=60)
Glucose, Bld: 100 mg/dL — ABNORMAL HIGH (ref 65–99)
Potassium: 4 mmol/L (ref 3.5–5.3)
Sodium: 136 mmol/L (ref 135–146)
TOTAL PROTEIN: 7.6 g/dL (ref 6.1–8.1)

## 2015-03-25 LAB — LIPID PANEL
Cholesterol: 216 mg/dL — ABNORMAL HIGH (ref 125–200)
HDL: 78 mg/dL (ref >=46)
LDL CALC: 101 mg/dL (ref <130)
TRIGLYCERIDES: 185 mg/dL — AB (ref <150)
Total CHOL/HDL Ratio: 2.8 Ratio (ref <=5.0)
VLDL: 37 mg/dL — ABNORMAL HIGH (ref <30)

## 2015-03-25 MED ORDER — ATORVASTATIN CALCIUM 10 MG PO TABS: 10.0000 mg | ORAL_TABLET | Freq: Every day | ORAL | Status: DC

## 2015-03-25 MED FILL — ?ATORVASTATIN 10 MG TABLET: 10 | 30 days supply | Qty: 30 | Fill #0

## 2015-03-25 NOTE — Telephone Encounter (Signed)
-----   Message from Lance Bosch, NP sent at 03/25/2015 11:47 AM EST ----- Patient is prediabetic. She needs to focus on weight, diet, and exercise. Cholesterol is elevated. She may benefit from statin therapy. Please send atorvastatin 10 mg to take daily.

## 2015-03-25 NOTE — Telephone Encounter (Signed)
Tried to contact patient at home number  Message states she can be reached on her new number [phone] Number called  Patient not available Message left on voice mail to return our call

## 2015-03-25 NOTE — Telephone Encounter (Signed)
Pt. Returned call. Please f/u with pt. °

## 2015-03-25 NOTE — Telephone Encounter (Signed)
Returned call to patient and she is aware of her recent lab results and to  Pick up her cholesterol medication at the pharmacy here

## 2015-04-02 ENCOUNTER — Encounter: Payer: Self-pay | Admitting: Family Medicine

## 2015-04-02 ENCOUNTER — Ambulatory Visit (INDEPENDENT_AMBULATORY_CARE_PROVIDER_SITE_OTHER): Payer: Self-pay | Admitting: Family Medicine

## 2015-04-02 ENCOUNTER — Ambulatory Visit
Admission: RE | Admit: 2015-04-02 | Discharge: 2015-04-02 | Disposition: A | Discharge status: Home / Self Care | Payer: No Typology Code available for payment source | Source: Ambulatory Visit | Attending: Family Medicine | Admitting: Family Medicine

## 2015-04-02 VITALS — BP 138/77 | Ht 71.0 in | Wt 225.0 lb

## 2015-04-02 DIAGNOSIS — M25561 Pain in right knee: Secondary | ICD-10-CM

## 2015-04-02 DIAGNOSIS — M25569 Pain in unspecified knee: Secondary | ICD-10-CM

## 2015-04-02 DIAGNOSIS — M25562 Pain in left knee: Secondary | ICD-10-CM

## 2015-04-02 DIAGNOSIS — G8929 Other chronic pain: Secondary | ICD-10-CM | POA: Insufficient documentation

## 2015-04-02 NOTE — Assessment & Plan Note (Signed)
B/L retropatellar knee pain.  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

## 2015-04-02 NOTE — Progress Notes (Signed)
  [name] - 55 y.o. female MRN [id number]  Date of birth: 1961/01/01  SUBJECTIVE:  Including CC & ROS.  [name] is a 55 y.o. female who presents today for B/L knee pain.    Knee Pain B/L  -  Ongoing now for several months.  Denies specific injuries.  Denies swelling.  Worse ambulating up/down stairs.  Occasionally will have crepitus but denies specific locking, catching, giving way, or instability.  No previous injuries to either knee.  Has tried OTC medication with some relief and rest/laying supine will alleviate some of her pain.   PMHx - Updated and reviewed.  Contributory factors include: Noncontributory  PSHx - Updated and reviewed.  Contributory factors include:  Noncontributory  FHx - Updated and reviewed.  Contributory factors include:  Noncontributory  Medications - Updated/reviewed    12 point ROS negative other than per HPI.   Exam:  Filed Vitals:   04/02/15 1535  BP: 138/77    Gen: NAD, AAO 3 Cardiorespiratory - Normal respiratory effort/rate.  RRR Skin: No rashes or erythema Extremities: No edema, pulses +2 bilateral upper and lower extremity  Knee:  Normal to inspection with no erythema or effusion or obvious bony abnormalities.  No obvious Baker's cysts TTP compression patella  ROM normal in flexion (135 degrees) and extension (0 degrees) and lower leg rotation. Ligaments with solid consistent endpoints including ACL, PCL, LCL, MCL.  Negative Anterior Drawer/Lachman/Pivot Shift Negative Mcmurray's and provocative meniscal tests including Thessaly and Apley compression testing  Painful patellar compression.  Normal Patellar glide.  No apprehension   Neurovascularly intact B/L LE   Imaging:  3 view B/L Knee to be obtained

## 2015-04-16 ENCOUNTER — Telehealth: Payer: Self-pay | Admitting: Internal Medicine

## 2015-04-16 NOTE — Telephone Encounter (Signed)
Patient had an xray on the 25th of January and states she has not received her results...please follow up

## 2015-04-17 ENCOUNTER — Encounter (HOSPITAL_COMMUNITY): Payer: Self-pay

## 2015-04-17 ENCOUNTER — Emergency Department (HOSPITAL_COMMUNITY)
Admission: EM | Admit: 2015-04-17 | Discharge: 2015-04-17 | Disposition: A | Discharge status: Home / Self Care | Payer: No Typology Code available for payment source | Attending: Emergency Medicine | Admitting: Emergency Medicine

## 2015-04-17 ENCOUNTER — Telehealth: Payer: Self-pay

## 2015-04-17 ENCOUNTER — Telehealth: Payer: Self-pay | Admitting: Family Medicine

## 2015-04-17 DIAGNOSIS — I10 Essential (primary) hypertension: Secondary | ICD-10-CM | POA: Insufficient documentation

## 2015-04-17 DIAGNOSIS — J069 Acute upper respiratory infection, unspecified: Secondary | ICD-10-CM | POA: Insufficient documentation

## 2015-04-17 DIAGNOSIS — Z79899 Other long term (current) drug therapy: Secondary | ICD-10-CM | POA: Insufficient documentation

## 2015-04-17 DIAGNOSIS — E7800 Pure hypercholesterolemia, unspecified: Secondary | ICD-10-CM | POA: Insufficient documentation

## 2015-04-17 MED ORDER — HYDROCODONE-ACETAMINOPHEN 5-325 MG PO TABS: ORAL_TABLET | ORAL | Status: DC

## 2015-04-17 MED FILL — HYDROCODON-APAP 5-325: 5-325 | 2 days supply | Qty: 7 | Fill #0

## 2015-04-17 NOTE — Discharge Instructions (Signed)
Return to the emergency room for any worsening or concerning symptoms including fast breathing, heart racing, confusion, vomiting.  Rest, cover your mouth when you cough and wash your hands frequently.   Push fluids: water or Gatorade, do not drink any soda, juice or caffeinated beverages.  For fever and pain control you can take Motrin (ibuprofen) as follows: 400 mg (this is normally 2 over the counter pills) every 4 hours with food.  Do not return to work until a day after your fever breaks.   Take Vicodin for cough and pain control, do not drink alcohol, drive, care for children or do other critical tasks while taking Vicodin       Upper Respiratory Infection, Adult Most upper respiratory infections (URIs) are caused by a virus. A URI affects the nose, throat, and upper air passages. The most common type of URI is often called "the common cold." HOME CARE   Take medicines only as told by your doctor.  Gargle warm saltwater or take cough drops to comfort your throat as told by your doctor.  Use a warm mist humidifier or inhale steam from a shower to increase air moisture. This may make it easier to breathe.  Drink enough fluid to keep your pee (urine) clear or pale yellow.  Eat soups and other clear broths.  Have a healthy diet.  Rest as needed.  Go back to work when your fever is gone or your doctor says it is okay.  You may need to stay home longer to avoid giving your URI to others.  You can also wear a face mask and wash your hands often to prevent spread of the virus.  Use your inhaler more if you have asthma.  Do not use any tobacco products, including cigarettes, chewing tobacco, or electronic cigarettes. If you need help quitting, ask your doctor. GET HELP IF:  You are getting worse, not better.  Your symptoms are not helped by medicine.  You have chills.  You are getting more short of breath.  You have brown or red mucus.  You have yellow or brown  discharge from your nose.  You have pain in your face, especially when you bend forward.  You have a fever.  You have puffy (swollen) neck glands.  You have pain while swallowing.  You have white areas in the back of your throat. GET HELP RIGHT AWAY IF:   You have very bad or constant:  Headache.  Ear pain.  Pain in your forehead, behind your eyes, and over your cheekbones (sinus pain).  Chest pain.  You have long-lasting (chronic) lung disease and any of the following:  Wheezing.  Long-lasting cough.  Coughing up blood.  A change in your usual mucus.  You have a stiff neck.  You have changes in your:  Vision.  Hearing.  Thinking.  Mood. MAKE SURE YOU:   Understand these instructions.  Will watch your condition.  Will get help right away if you are not doing well or get worse.   This information is not intended to replace advice given to you by your health care provider. Make sure you discuss any questions you have with your health care provider.   Document Released: 08/11/2007 Document Revised: 07/09/2014 Document Reviewed: 05/30/2013 Elsevier Interactive Patient Education [employer].

## 2015-04-17 NOTE — Telephone Encounter (Signed)
Attempted to call pt but she did not pick up.  LVM to call back.  Please let her know that she has OA in both knees.  If interested in injections, have her come in for appointment.  Thanks, Gaspar Bidding

## 2015-04-17 NOTE — Telephone Encounter (Signed)
-----   Message from Laurey Arrow, [place] sent at 04/17/2015 11:34 AM EST ----- Regarding: FW: xray results Contact: [phone] What would you like me to tell this patient about her xrays?  ----- Message -----    From: Carolyne Littles    Sent: 04/17/2015  10:46 AM      To: Laurey Arrow, RN Subject: xray results                                   Pt is asking for a call regarding knee xray

## 2015-04-17 NOTE — ED Provider Notes (Signed)
CSN: [id number]     Arrival date & time 04/17/15  [id number] History  By signing my name below, I, [name], attest that this documentation has been prepared under the direction and in the presence of Monico Blitz, PA-C Electronically Signed: Ladene Artist, ED Scribe 04/17/2015 at 10:27 AM.   Chief Complaint  Patient presents with  . Nasal Congestion   The history is provided by the patient. No language interpreter was used.   HPI Comments: [name] is a 55 y.o. female, with a h/o HTN, who presents to the Emergency Department complaining of dry cough for the past week. Pt reports associated nasal congestion, sinus pressure, eye pressure, rhinorrhea, subjective fever yesterday that has resolved. Pt has tried Copywriter, advertising and resting for 2 days without significant relief. She denies sore throat, chest pain, SOB, nausea, vomiting, change in bowels, difficulty urinating.   Past Medical History  Diagnosis Date  . Hypertension   . OAB (overactive bladder)   . Hypercholesteremia    Past Surgical History  Procedure Laterality Date  . Abdominal hysterectomy     Family History  Problem Relation Age of Onset  . Hypertension Mother   . Diabetes Mother   . Cancer Father    Social History  Substance Use Topics  . Smoking status: Never Smoker   . Smokeless tobacco: None  . Alcohol Use: Yes     Comment: occasonl   OB History    No data available     Review of Systems A complete 10 system review of systems was obtained and all systems are negative except as noted in the HPI and PMH.   Allergies  Review of patient's allergies indicates no known allergies.  Home Medications   Prior to Admission medications   Medication Sig Start Date End Date Taking? Authorizing Provider  atorvastatin (LIPITOR) 10 MG tablet Take 1 tablet (10 mg total) by mouth daily. Patient not taking: Reported on 04/02/2015 03/25/15   Lance Bosch, NP  HYDROcodone-acetaminophen (NORCO/VICODIN) 5-325 MG per  tablet Take 1 tablet by mouth every 8 (eight) hours as needed for severe pain. Patient not taking: Reported on 04/02/2015 11/03/14   Merrily Pew, MD  omeprazole (PRILOSEC) 20 MG capsule Take 1 capsule (20 mg total) by mouth daily. 03/18/15   Lance Bosch, NP  triamterene-hydrochlorothiazide (DYAZIDE) 37.5-25 MG capsule Take 1 each (1 capsule total) by mouth daily. 03/18/15   Lance Bosch, NP  triamterene-hydrochlorothiazide (MAXZIDE-25) 37.5-25 MG tablet  01/07/15   Historical Provider, MD   BP 158/94 mmHg  Pulse 78  Temp(Src) 98.1 F (36.7 C) (Oral)  Resp 18  SpO2 98% Physical Exam  Constitutional: She is oriented to person, place, and time. She appears well-developed and well-nourished. No distress.  HENT:  Head: Normocephalic and atraumatic.  Right Ear: External ear normal.  Left Ear: External ear normal.  Mouth/Throat: Oropharynx is clear and moist. No oropharyngeal exudate.  No drooling or stridor. Posterior pharynx mildly erythematous no significant tonsillar hypertrophy. No exudate. Soft palate rises symmetrically. No TTP or induration under tongue.   No tenderness to palpation of frontal or bilateral maxillary sinuses.  Mild mucosal edema in the nares with scant rhinorrhea.  Bilateral tympanic membranes with normal architecture and good light reflex.    Eyes: Conjunctivae and EOM are normal. Pupils are equal, round, and reactive to light.  Neck: Normal range of motion. Neck supple. No tracheal deviation present.  Cardiovascular: Normal rate and regular rhythm.   Pulmonary/Chest: Effort  normal and breath sounds normal. No stridor. No respiratory distress. She has no wheezes. She has no rales. She exhibits no tenderness.  Abdominal: Soft. There is no tenderness. There is no rebound and no guarding.  Musculoskeletal: Normal range of motion.  Neurological: She is alert and oriented to person, place, and time.  Skin: Skin is warm and dry.  Psychiatric: She has a normal mood and  affect. Her behavior is normal.  Nursing note and vitals reviewed.  ED Course  Procedures (including critical care time) DIAGNOSTIC STUDIES: Oxygen Saturation is 98% on RA, normal by my interpretation.    COORDINATION OF CARE: 10:19 AM-Discussed treatment plan which includes Norco with pt at bedside and pt agreed to plan.   Labs Review Labs Reviewed - No data to display  Imaging Review No results found.   EKG Interpretation None      MDM   Final diagnoses:  URI (upper respiratory infection)    Filed Vitals:   04/17/15 0932  BP: 158/94  Pulse: 78  Temp: 98.1 F (36.7 C)  TempSrc: Oral  Resp: 18  SpO2: 98%    [name] is 55 y.o. female presenting with rhinorrhea and dry cough onset 1 week ago. NAD, Non-toxic appearing, AFVSS, LSCTA. Likely viral illness, advised patient to push fluids, over-the-counter medications for symptom relief.  Evaluation does not show pathology that would require ongoing emergent intervention or inpatient treatment. Pt is hemodynamically stable and mentating appropriately. Discussed findings and plan with patient/guardian, who agrees with care plan. All questions answered. Return precautions discussed and outpatient follow up given.    I personally performed the services described in this documentation, which was scribed in my presence. The recorded information has been reviewed and is accurate.    Monico Blitz, PA-C 04/17/15 1616  Charlesetta Shanks, MD 04/18/15 [phone]

## 2015-04-17 NOTE — ED Notes (Signed)
Patient here with cough and congestion x 1 week, taking otc meds with minimal relief

## 2015-04-17 NOTE — Telephone Encounter (Signed)
Called patient this am  Patient is aware we were not the ordering dr for her x ray She needs to notify them for her results

## 2015-04-25 NOTE — Telephone Encounter (Signed)
She called back and we set her up for injections next Wed. with you.

## 2015-04-30 ENCOUNTER — Encounter: Payer: Self-pay | Admitting: Family Medicine

## 2015-04-30 ENCOUNTER — Ambulatory Visit (INDEPENDENT_AMBULATORY_CARE_PROVIDER_SITE_OTHER): Payer: No Typology Code available for payment source | Admitting: Family Medicine

## 2015-04-30 VITALS — BP 140/68 | Ht 71.0 in | Wt 224.0 lb

## 2015-04-30 DIAGNOSIS — M13169 Monoarthritis, not elsewhere classified, unspecified knee: Secondary | ICD-10-CM

## 2015-04-30 DIAGNOSIS — M1710 Unilateral primary osteoarthritis, unspecified knee: Secondary | ICD-10-CM | POA: Insufficient documentation

## 2015-04-30 MED ORDER — METHYLPREDNISOLONE ACETATE 40 MG/ML IJ SUSP
40.0000 mg | Freq: Once | INTRAMUSCULAR | Status: AC
Start: 2015-04-30 — End: 2015-04-30
  Administered 2015-04-30: 40 mg via INTRA_ARTICULAR

## 2015-04-30 MED ORDER — METHYLPREDNISOLONE ACETATE 40 MG/ML IJ SUSP
40.0000 mg | Freq: Once | INTRAMUSCULAR | Status: AC
  Administered 2015-04-30: 40 mg via INTRA_ARTICULAR

## 2015-04-30 NOTE — Addendum Note (Signed)
Addended by: Cyd Silence on: 04/30/2015 04:57 PM   Modules accepted: Orders

## 2015-04-30 NOTE — Assessment & Plan Note (Signed)
X-rays from 04/02/15 showing patellofemoral and medial joint DJD  - Injections today - Continue OTC medications - Consider bracing, Synvisc, topical pennsaid/voltaren    Aspiration/Injection Procedure Note [name] [birth year]  Procedure: Injection Indications: B/L knee pain/OA  Procedure Details Consent: Risks of procedure as well as the alternatives and risks of each were explained to the (patient/caregiver).  Consent for procedure obtained. Time Out: Verified patient identification, verified procedure, site/side was marked, verified correct patient position, special equipment/implants available, medications/allergies/relevent history reviewed, required imaging and test results available.  Performed.  The area was cleaned with iodine and alcohol swabs.    The bilateral medial knee joints were injected using 2 cc's of 40mg  Depomedrol and 4 cc's of 1% lidocaine with a 21 1 1/2" needle, each  A sterile dressing was applied.  Patient did tolerate procedure well. Estimated blood loss: None

## 2015-04-30 NOTE — Progress Notes (Signed)
  [name] - 55 y.o. female MRN [id number]  Date of birth: March 04, 1961  SUBJECTIVE:  Including CC & ROS.  [name] is a 55 y.o. female who presents today for B/L knee pain.    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  Denies specific injuries.  Denies swelling.  Worse ambulating up/down stairs.  Occasionally will have crepitus but denies specific locking, catching, giving way, or instability.  No previous injuries to either knee.  Has tried OTC medication with some relief and rest/laying supine will alleviate some of her pain.   F/U 04/30/15 - Pt still having pain with ambulationg.  Has been taking OTC which continues to help.  X-rays from 04/02/15 showing B/L medial and patellofemoral OA/joint space narrowing.   PMHx - Updated and reviewed.  Contributory factors include: Noncontributory  PSHx - Updated and reviewed.  Contributory factors include:  Noncontributory  FHx - Updated and reviewed.  Contributory factors include:  Noncontributory  Medications - Updated/reviewed    12 point ROS negative other than per HPI.   Exam:  Filed Vitals:   04/30/15 1612  BP: 140/68    Gen: NAD, AAO 3 Cardiorespiratory - Normal respiratory effort/rate.  RRR Skin: No rashes or erythema Extremities: No edema, pulses +2 bilateral upper and lower extremity  Knee:  Normal to inspection with no erythema or effusion or obvious bony abnormalities.  No obvious Baker's cysts TTP compression patella  ROM normal in flexion (135 degrees) and extension (0 degrees) and lower leg rotation. Painful patellar compression.  Normal Patellar glide.  No apprehension   Neurovascularly intact B/L LE   Imaging:  3 view B/L Knee 04/02/15 - Patellofemoral and medial compartment JSN, subchondral sclerosis

## 2015-05-22 MED FILL — TRIAMTERENE-HCTZ 37.5-25 MG: 37.5-25 | 30 days supply | Qty: 30 | Fill #1

## 2015-05-22 MED FILL — ?ATORVASTATIN 10 MG TABLET: 10 | 30 days supply | Qty: 30 | Fill #1

## 2015-05-22 MED FILL — OMEPRAZOLE DR 20 MG CAPSULE: 20 | 30 days supply | Qty: 30 | Fill #1

## 2015-05-26 ENCOUNTER — Emergency Department (INDEPENDENT_AMBULATORY_CARE_PROVIDER_SITE_OTHER)
Admission: EM | Admit: 2015-05-26 | Discharge: 2015-05-26 | Disposition: A | Discharge status: Home / Self Care | Payer: Self-pay | Source: Home / Self Care | Attending: Family Medicine | Admitting: Family Medicine

## 2015-05-26 ENCOUNTER — Encounter (HOSPITAL_COMMUNITY): Payer: Self-pay | Admitting: Emergency Medicine

## 2015-05-26 DIAGNOSIS — S39012A Strain of muscle, fascia and tendon of lower back, initial encounter: Secondary | ICD-10-CM

## 2015-05-26 MED ORDER — CYCLOBENZAPRINE HCL 5 MG PO TABS: 5.0000 mg | ORAL_TABLET | Freq: Three times a day (TID) | ORAL | Status: DC | PRN

## 2015-05-26 MED ORDER — IBUPROFEN 800 MG PO TABS: 800.0000 mg | ORAL_TABLET | Freq: Three times a day (TID) | ORAL | Status: DC

## 2015-05-26 MED FILL — CYCLOBENZAPRINE 5 MG TABLET: 5 | 10 days supply | Qty: 30 | Fill #0

## 2015-05-26 MED FILL — IBUPROFEN 800 MG TABLET: 800 | 10 days supply | Qty: 30 | Fill #0

## 2015-05-26 NOTE — ED Provider Notes (Signed)
CSN: [id number]     Arrival date & time 05/26/15  1506 History   First MD Initiated Contact with Patient 05/26/15 1649     No chief complaint on file.  (Consider location/radiation/quality/duration/timing/severity/associated sxs/prior Treatment) Patient is a 55 y.o. female presenting with back pain. The history is provided by the patient.  Back Pain Location:  Lumbar spine Quality:  Stiffness Radiates to:  Does not radiate Pain severity:  Mild Onset quality:  Gradual Duration:  3 days Progression:  Unchanged Chronicity:  New Context: lifting heavy objects   Context: not occupational injury   Context comment:  Pulling on moped scooter Associated symptoms: no abdominal pain, no abdominal swelling, no bladder incontinence, no bowel incontinence, no dysuria, no fever and no pelvic pain     Past Medical History  Diagnosis Date  . Hypertension   . OAB (overactive bladder)   . Hypercholesteremia    Past Surgical History  Procedure Laterality Date  . Abdominal hysterectomy     Family History  Problem Relation Age of Onset  . Hypertension Mother   . Diabetes Mother   . Cancer Father    Social History  Substance Use Topics  . Smoking status: Never Smoker   . Smokeless tobacco: Not on file  . Alcohol Use: Yes     Comment: occasonl   OB History    No data available     Review of Systems  Constitutional: Negative.  Negative for fever.  Cardiovascular: Negative.   Gastrointestinal: Negative.  Negative for nausea, vomiting, abdominal pain, diarrhea and bowel incontinence.  Genitourinary: Negative.  Negative for bladder incontinence, dysuria and pelvic pain.  Musculoskeletal: Positive for myalgias and back pain. Negative for joint swelling and gait problem.  Skin: Negative.   All other systems reviewed and are negative.   Allergies  Review of patient's allergies indicates no known allergies.  Home Medications   Prior to Admission medications   Medication Sig Start  Date End Date Taking? Authorizing Provider  atorvastatin (LIPITOR) 10 MG tablet Take 1 tablet (10 mg total) by mouth daily. Patient not taking: Reported on 04/02/2015 03/25/15   Lance Bosch, NP  cyclobenzaprine (FLEXERIL) 5 MG tablet Take 1 tablet (5 mg total) by mouth 3 (three) times daily as needed for muscle spasms. 05/26/15   Billy Fischer, MD  HYDROcodone-acetaminophen (NORCO/VICODIN) 5-325 MG tablet Take 1-2 tablets by mouth every 6 hours as needed for pain and/or cough. 04/17/15   Nicole Pisciotta, PA-C  ibuprofen (ADVIL,MOTRIN) 800 MG tablet Take 1 tablet (800 mg total) by mouth 3 (three) times daily. For side soreness 05/26/15   Billy Fischer, MD  omeprazole (PRILOSEC) 20 MG capsule Take 1 capsule (20 mg total) by mouth daily. 03/18/15   Lance Bosch, NP  triamterene-hydrochlorothiazide (DYAZIDE) 37.5-25 MG capsule Take 1 each (1 capsule total) by mouth daily. 03/18/15   Lance Bosch, NP  triamterene-hydrochlorothiazide [facility]) 37.5-25 MG tablet  01/07/15   Historical Provider, MD   Meds Ordered and Administered this Visit  Medications - No data to display  BP 159/99 mmHg  Pulse 78  Temp(Src) 99.2 F (37.3 C) (Oral)  Resp 16  SpO2 98% No data found.   Physical Exam  Constitutional: She is oriented to person, place, and time. She appears well-developed and well-nourished. No distress.  Abdominal: Soft. Bowel sounds are normal. She exhibits no distension and no mass. There is no tenderness. There is no rebound and no guarding.  Musculoskeletal: She exhibits tenderness.  Lumbar back: She exhibits tenderness, bony tenderness, pain and spasm. She exhibits normal range of motion, no swelling, no deformity and normal pulse.       Back:  Neurological: She is alert and oriented to person, place, and time.  Skin: Skin is warm and dry.  Nursing note and vitals reviewed.   ED Course  Procedures (including critical care time)  Labs Review Labs Reviewed - No data to  display  Imaging Review No results found.   Visual Acuity Review  Right Eye Distance:   Left Eye Distance:   Bilateral Distance:    Right Eye Near:   Left Eye Near:    Bilateral Near:         MDM   1. Low back strain, initial encounter    Meds ordered this encounter  Medications  . cyclobenzaprine (FLEXERIL) 5 MG tablet    Sig: Take 1 tablet (5 mg total) by mouth 3 (three) times daily as needed for muscle spasms.    Dispense:  30 tablet    Refill:  0  . ibuprofen (ADVIL,MOTRIN) 800 MG tablet    Sig: Take 1 tablet (800 mg total) by mouth 3 (three) times daily. For side soreness    Dispense:  30 tablet    Refill:  0       Billy Fischer, MD 05/26/15 [phone]

## 2015-05-26 NOTE — ED Notes (Signed)
Low, right back pain , rates pain a 7/10

## 2015-05-26 NOTE — Discharge Instructions (Signed)
Heat to back , avoid straining with your scooter if possible.

## 2015-06-05 ENCOUNTER — Ambulatory Visit: Payer: No Typology Code available for payment source | Admitting: Internal Medicine

## 2015-08-13 MED FILL — ?ATORVASTATIN 10 MG TABLET: 10 | 30 days supply | Qty: 30 | Fill #2

## 2015-08-13 MED FILL — ?OMEPRAZOLE DR 20 MG CAPSUL: 20 | 30 days supply | Qty: 30 | Fill #2

## 2015-08-13 MED FILL — ?TRIAMTERENE/HCTZ 37.5/25TB: 37.5-25 | 30 days supply | Qty: 30 | Fill #2

## 2015-08-22 ENCOUNTER — Ambulatory Visit: Payer: Self-pay | Attending: Internal Medicine

## 2015-10-15 MED FILL — ?OMEPRAZOLE DR 20 MG CAPSUL: 20 | 30 days supply | Qty: 30 | Fill #3

## 2015-10-15 MED FILL — ?TRIAMTERENE/HCTZ 37.5/25TB: 37.5-25 | 30 days supply | Qty: 30 | Fill #3

## 2016-02-04 ENCOUNTER — Encounter (HOSPITAL_COMMUNITY): Payer: Self-pay | Admitting: *Deleted

## 2016-02-04 ENCOUNTER — Emergency Department (HOSPITAL_COMMUNITY)
Admission: EM | Admit: 2016-02-04 | Discharge: 2016-02-04 | Disposition: A | Discharge status: Home / Self Care | Payer: Self-pay | Attending: Emergency Medicine | Admitting: Emergency Medicine

## 2016-02-04 DIAGNOSIS — I10 Essential (primary) hypertension: Secondary | ICD-10-CM | POA: Insufficient documentation

## 2016-02-04 DIAGNOSIS — Z79899 Other long term (current) drug therapy: Secondary | ICD-10-CM | POA: Insufficient documentation

## 2016-02-04 DIAGNOSIS — G43909 Migraine, unspecified, not intractable, without status migrainosus: Secondary | ICD-10-CM | POA: Insufficient documentation

## 2016-02-04 MED ORDER — DEXAMETHASONE SODIUM PHOSPHATE 10 MG/ML IJ SOLN
10.0000 mg | Freq: Once | INTRAMUSCULAR | Status: AC
  Administered 2016-02-04: 10 mg via INTRAVENOUS
  Filled 2016-02-04: qty 1

## 2016-02-04 MED ORDER — SODIUM CHLORIDE 0.9 % IV BOLUS (SEPSIS)
1000.0000 mL | Freq: Once | INTRAVENOUS | Status: AC
  Administered 2016-02-04: 1000 mL via INTRAVENOUS

## 2016-02-04 MED ORDER — DIPHENHYDRAMINE HCL 50 MG/ML IJ SOLN
25.0000 mg | Freq: Once | INTRAMUSCULAR | Status: AC
  Administered 2016-02-04: 25 mg via INTRAVENOUS
  Filled 2016-02-04: qty 1

## 2016-02-04 MED ORDER — METOCLOPRAMIDE HCL 5 MG/ML IJ SOLN
10.0000 mg | Freq: Once | INTRAMUSCULAR | Status: AC
  Administered 2016-02-04: 10 mg via INTRAVENOUS
  Filled 2016-02-04: qty 2

## 2016-02-04 NOTE — ED Provider Notes (Signed)
[facility] DEPT Provider Note   CSN: [id number] Arrival date & time: 02/04/16  1443     History   Chief Complaint Chief Complaint  Patient presents with  . Headache    HPI [name] is a 55 y.o. female.   Headache   This is a new problem. The current episode started 2 days ago. The problem occurs every few hours. The problem has not changed since onset.The headache is associated with nothing. The pain is located in the right unilateral, temporal and parietal region. The pain is at a severity of 8/10. The pain is moderate. The pain does not radiate. Pertinent negatives include no anorexia, no fever, no malaise/fatigue, no near-syncope, no palpitations, no syncope, no shortness of breath, no nausea and no vomiting. She has tried NSAIDs for the symptoms. The treatment provided moderate relief.    Past Medical History:  Diagnosis Date  . Hypercholesteremia   . Hypertension   . OAB (overactive bladder)     Patient Active Problem List   Diagnosis Date Noted  . 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 Knee pain, chronic 04/02/2015  . DIVERTICULAR DISEASE 11/26/2009  . ALLERGIC RHINITIS 10/16/2009  . RECTAL PAIN 10/16/2009  . CONSTIPATION 03/11/2009  . ANEMIA, IRON DEFICIENCY 02/21/2009  . HYPERTENSION, BENIGN ESSENTIAL 02/19/2009  . GERD 02/19/2009  . VAGINAL PRURITUS 02/19/2009  . FREQUENCY, URINARY 02/19/2009  . MENOPAUSE, SURGICAL 03/08/2004    Past Surgical History:  Procedure Laterality Date  . ABDOMINAL HYSTERECTOMY      OB History    No data available       Home Medications    Prior to Admission medications   Medication Sig Start Date End Date Taking? Authorizing Provider  omeprazole (PRILOSEC) 20 MG capsule Take 1 capsule (20 mg total) by mouth daily. Patient taking differently: Take 20 mg by mouth daily as needed (heartburn).  03/18/15  Yes Lance Bosch, NP  atorvastatin (LIPITOR) 10 MG tablet Take 1 tablet (10 mg total) by mouth  daily. Patient not taking: Reported on 02/04/2016 03/25/15   Lance Bosch, NP  triamterene-hydrochlorothiazide (DYAZIDE) 37.5-25 MG capsule Take 1 each (1 capsule total) by mouth daily. Patient not taking: Reported on 02/04/2016 03/18/15   Lance Bosch, NP    Family History Family History  Problem Relation Age of Onset  . Hypertension Mother   . Diabetes Mother   . Cancer Father     Social History Social History  Substance Use Topics  . Smoking status: Never Smoker  . Smokeless tobacco: Not on file  . Alcohol use Yes     Comment: occasonl     Allergies   Patient has no known allergies.   Review of Systems Review of Systems  Constitutional: Negative for chills, fever and malaise/fatigue.  HENT: Negative for ear pain and sore throat.   Eyes: Negative for pain and visual disturbance.  Respiratory: Negative for cough and shortness of breath.   Cardiovascular: Negative for chest pain, palpitations, syncope and near-syncope.  Gastrointestinal: Negative for abdominal pain, anorexia, nausea and vomiting.  Genitourinary: Negative for dysuria and hematuria.  Musculoskeletal: Negative for arthralgias and back pain.  Skin: Negative for color change and rash.  Neurological: Positive for headaches. Negative for seizures and syncope.  All other systems reviewed and are negative.    Physical Exam Updated Vital Signs BP 131/63   Pulse 63   Temp 98.8 F (37.1 C) (Oral)   Resp 20   SpO2 100%   Physical Exam  Constitutional: She is oriented to person, place, and time. She appears well-developed and well-nourished. No distress.  HENT:  Head: Normocephalic and atraumatic.  Eyes: Conjunctivae are normal.  Neck: Neck supple.  Cardiovascular: Normal rate and regular rhythm.   No murmur heard. Pulmonary/Chest: Effort normal and breath sounds normal. No respiratory distress.  Abdominal: Soft. There is no tenderness.  Musculoskeletal: She exhibits no edema.  Neurological: She is  alert and oriented to person, place, and time. No cranial nerve deficit or sensory deficit. She exhibits normal muscle tone. Coordination normal.  No dysdiadochokinesia, no dysmetria, normal ambulation  Skin: Skin is warm and dry.  Psychiatric: She has a normal mood and affect.  Nursing note and vitals reviewed.    ED Treatments / Results  Labs (all labs ordered are listed, but only abnormal results are displayed) Labs Reviewed - No data to display  EKG  EKG Interpretation None       Radiology No results found.  Procedures Procedures (including critical care time)  Medications Ordered in ED Medications  metoCLOPramide (REGLAN) injection 10 mg (10 mg Intravenous Given 02/04/16 2156)  diphenhydrAMINE (BENADRYL) injection 25 mg (25 mg Intravenous Given 02/04/16 2156)  sodium chloride 0.9 % bolus 1,000 mL (0 mLs Intravenous Stopped 02/04/16 2309)  dexamethasone (DECADRON) injection 10 mg (10 mg Intravenous Given 02/04/16 2156)     Initial Impression / Assessment and Plan / ED Course  I have reviewed the triage vital signs and the nursing notes.  Pertinent labs & imaging results that were available during my care of the patient were reviewed by me and considered in my medical decision making (see chart for details).  Clinical Course     55 year old female comes today with a right-sided headache. No meningeal signs no fevers chills. Not sudden onset no report of symptoms concerning for a thunderclap headache worse headache representing subarachnoid hemorrhage. She's had headaches like this in the past. She tried ibuprofen they gave her moderate relief. She is back with pain here today. No neurologic dysfunction detected on exam. Vital signs are stable afebrile. Remainder of her exam is unremarkable. No laboratory testing needed. Reglan Benadryl Decadron IV fluids are given as headache cocktail patient has complete resolution of headache. Patient is discharged home strict return  precautions regarding her headache. Vital signs stable time of discharge.  Final Clinical Impressions(s) / ED Diagnoses   Final diagnoses:  Migraine without status migrainosus, not intractable, unspecified migraine type    New Prescriptions Discharge Medication List as of 02/04/2016 10:50 PM       Dewaine Conger, MD 02/04/16 [place], MD 02/05/16 1100

## 2016-02-04 NOTE — ED Triage Notes (Signed)
Pt reports onset yesterday of right side head pain. Denies sensitivity to light or n/v. No neuro deficits noted at triage. Reports pain was relieved with ibuprofen, is pain free at this time. Also reports recent ear pain.

## 2016-07-13 ENCOUNTER — Encounter (HOSPITAL_COMMUNITY): Payer: Self-pay | Admitting: Emergency Medicine

## 2016-07-13 ENCOUNTER — Emergency Department (HOSPITAL_COMMUNITY)
Admission: EM | Admit: 2016-07-13 | Discharge: 2016-07-13 | Disposition: A | Discharge status: Home / Self Care | Payer: Self-pay | Attending: Emergency Medicine | Admitting: Emergency Medicine

## 2016-07-13 DIAGNOSIS — Z79899 Other long term (current) drug therapy: Secondary | ICD-10-CM | POA: Insufficient documentation

## 2016-07-13 DIAGNOSIS — M542 Cervicalgia: Secondary | ICD-10-CM | POA: Insufficient documentation

## 2016-07-13 DIAGNOSIS — I10 Essential (primary) hypertension: Secondary | ICD-10-CM | POA: Insufficient documentation

## 2016-07-13 MED ORDER — NAPROXEN 375 MG PO TABS: 375.0000 mg | ORAL_TABLET | Freq: Two times a day (BID) | ORAL | 0 refills | Status: DC

## 2016-07-13 MED ORDER — CYCLOBENZAPRINE HCL 10 MG PO TABS: 10.0000 mg | ORAL_TABLET | Freq: Every evening | ORAL | 0 refills | Status: DC | PRN

## 2016-07-13 MED FILL — NAPROXEN 375 MG TABLET: 375 | 7 days supply | Qty: 14 | Fill #0

## 2016-07-13 MED FILL — CYCLOBENZAPRINE 10 MG TAB: 10 | 7 days supply | Qty: 7 | Fill #0

## 2016-07-13 NOTE — Discharge Instructions (Signed)
Please read instructions below. You can take the muscle relaxer, cyclobenzaprine (flexeril) at night as needed for muscle spasm. You can take the naproxen up to 2 times per day with meals for pain. Apply ice to your neck to 20 minutes at a time, you can also apply heat. Massage your neck when possible.  Follow up with primary care if your pain has not resolved. Return to the ER for headache, fever, numbness or tingling down your arms, or new or worsening symptoms.

## 2016-07-13 NOTE — ED Provider Notes (Signed)
[facility] DEPT Provider Note   CSN: [id number] Arrival date & time: 07/13/16  0714     History   Chief Complaint Chief Complaint  Patient presents with  . Torticollis    HPI [name] is a 56 y.o. female.  Patient reports with right-sided neck pain and stiffness that began 2 days ago. Patient states 3 days ago she noticed her shoulders were sore, the following day the right side of her neck was stiff and painful, worse with turning her head to the right, not relieved by topical muscle cream. Describes pain as achy, 10/10 with movement. Denies numbness/tingling or weakness of arms. No recent heavy lifting, no recent injuries, no previous injuries to her neck. Denies hx of gastric ulcers or GI bleed.      Past Medical History:  Diagnosis Date  . Hypercholesteremia   . Hypertension   . OAB (overactive bladder)     Patient Active Problem List   Diagnosis Date Noted  . 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 Knee pain, chronic 04/02/2015  . DIVERTICULAR DISEASE 11/26/2009  . ALLERGIC RHINITIS 10/16/2009  . RECTAL PAIN 10/16/2009  . CONSTIPATION 03/11/2009  . ANEMIA, IRON DEFICIENCY 02/21/2009  . HYPERTENSION, BENIGN ESSENTIAL 02/19/2009  . GERD 02/19/2009  . VAGINAL PRURITUS 02/19/2009  . FREQUENCY, URINARY 02/19/2009  . MENOPAUSE, SURGICAL 03/08/2004    Past Surgical History:  Procedure Laterality Date  . ABDOMINAL HYSTERECTOMY      OB History    No data available       Home Medications    Prior to Admission medications   Medication Sig Start Date End Date Taking? Authorizing Provider  atorvastatin (LIPITOR) 10 MG tablet Take 1 tablet (10 mg total) by mouth daily. Patient not taking: Reported on 02/04/2016 03/25/15   Lance Bosch, NP  cyclobenzaprine (FLEXERIL) 10 MG tablet Take 1 tablet (10 mg total) by mouth at bedtime as needed for muscle spasms. 07/13/16   Russo, [place] N, PA-C  naproxen (NAPROSYN) 375 MG tablet Take 1 tablet (375 mg total) by  mouth 2 (two) times daily. 07/13/16   Russo, [place] N, PA-C  omeprazole (PRILOSEC) 20 MG capsule Take 1 capsule (20 mg total) by mouth daily. Patient taking differently: Take 20 mg by mouth daily as needed (heartburn).  03/18/15   Lance Bosch, NP  triamterene-hydrochlorothiazide (DYAZIDE) 37.5-25 MG capsule Take 1 each (1 capsule total) by mouth daily. Patient not taking: Reported on 02/04/2016 03/18/15   Lance Bosch, NP    Family History Family History  Problem Relation Age of Onset  . Hypertension Mother   . Diabetes Mother   . Cancer Father     Social History Social History  Substance Use Topics  . Smoking status: Never Smoker  . Smokeless tobacco: Not on file  . Alcohol use Yes     Comment: occasonl     Allergies   Patient has no known allergies.   Review of Systems Review of Systems  Constitutional: Negative for chills and fever.  Eyes: Negative for pain.  Musculoskeletal: Positive for neck pain and neck stiffness.  Neurological: Negative for weakness, numbness and headaches.     Physical Exam Updated Vital Signs BP (!) 146/63 (BP Location: Left Arm)   Pulse 65   Temp 98.5 F (36.9 C)   Resp 18   Ht 5\' 11"  (1.803 m)   Wt 108.9 kg   SpO2 99%   BMI 33.47 kg/m   Physical Exam  Constitutional: She is oriented  to person, place, and time. She appears well-developed and well-nourished.  HENT:  Head: Normocephalic and atraumatic.  Eyes: Conjunctivae and EOM are normal. Pupils are equal, round, and reactive to light.  Neck: Neck supple.  Cardiovascular: Normal rate.   Pulmonary/Chest: Effort normal.  Musculoskeletal:  No spinal or paraspinal tenderness. Right posterolateral muscular tenderness of neck, beginning posterior to ear and extending down to clavicle. Minimal trapezius tenderness on right. Normal range of motion when looking to left, down, up. Decreased rightward movement of neck secondary to pain.   Neurological: She is alert and oriented to  person, place, and time. No cranial nerve deficit. She exhibits normal muscle tone.  5/5 strength bilateral upper extremities. No sensory deficit.  Psychiatric: She has a normal mood and affect. Her behavior is normal.  Nursing note and vitals reviewed.    ED Treatments / Results  Labs (all labs ordered are listed, but only abnormal results are displayed) Labs Reviewed - No data to display  EKG  EKG Interpretation None       Radiology No results found.  Procedures Procedures (including critical care time)  Medications Ordered in ED Medications - No data to display   Initial Impression / Assessment and Plan / ED Course  I have reviewed the triage vital signs and the nursing notes.  Pertinent labs & imaging results that were available during my care of the patient were reviewed by me and considered in my medical decision making (see chart for details).     Pt w right-sided muscular neck pain. Exam unconcerning for meningitis or subarachnoid hemorrhage. No fevers, HA, recent head or neck trauma. No spinal or paraspinal tenderness. VSS. NV intact. Will send w short course of naproxen (with meals) and flexeril. RICE therapy. PCP follow up. Strict return precautions given. Pt appears well and safe for discharge home.  Discussed results, findings, treatment and follow up. Patient advised of return precautions. Patient verbalized understanding and agreed with plan.  Final Clinical Impressions(s) / ED Diagnoses   Final diagnoses:  Neck pain    New Prescriptions Discharge Medication List as of 07/13/2016  8:53 AM    START taking these medications   Details  cyclobenzaprine (FLEXERIL) 10 MG tablet Take 1 tablet (10 mg total) by mouth at bedtime as needed for muscle spasms., Starting Tue 07/13/2016, Print    naproxen (NAPROSYN) 375 MG tablet Take 1 tablet (375 mg total) by mouth 2 (two) times daily., Starting Tue 07/13/2016, Print         Virgina Jock, [place] N, PA-C 07/13/16  1737    Carmin Muskrat, MD 07/18/16 [phone]

## 2016-07-13 NOTE — ED Triage Notes (Signed)
Pt woke up with stiff neck 2 days ago-- getting worse-- has used OTC without relief--

## 2016-11-16 ENCOUNTER — Telehealth: Payer: Self-pay | Admitting: General Practice

## 2016-11-16 NOTE — Telephone Encounter (Signed)
Pt called to try to make an appt with financial, she was informed that the last time she saw a provider here was 03/2015 and since it has been more than a a year she is a new pt for [place], Pt start yelling at me and was unable to explain more detail to her, I ask to please do not yell or scream she raise her voice even more and I ask her again to please stop yelling and she told me to should-up I procedure to hang-up the phone.Marland KitchenMarland KitchenMarland Kitchen

## 2016-11-17 ENCOUNTER — Ambulatory Visit: Payer: Self-pay | Attending: Family Medicine | Admitting: Family Medicine

## 2016-11-17 VITALS — BP 146/80 | HR 64 | Temp 98.3°F | Resp 18 | Ht 71.0 in | Wt 209.8 lb

## 2016-11-17 DIAGNOSIS — M25562 Pain in left knee: Secondary | ICD-10-CM | POA: Insufficient documentation

## 2016-11-17 DIAGNOSIS — Z87898 Personal history of other specified conditions: Secondary | ICD-10-CM

## 2016-11-17 DIAGNOSIS — I10 Essential (primary) hypertension: Secondary | ICD-10-CM

## 2016-11-17 DIAGNOSIS — Z1231 Encounter for screening mammogram for malignant neoplasm of breast: Secondary | ICD-10-CM

## 2016-11-17 DIAGNOSIS — Z1239 Encounter for other screening for malignant neoplasm of breast: Secondary | ICD-10-CM

## 2016-11-17 DIAGNOSIS — Z8709 Personal history of other diseases of the respiratory system: Secondary | ICD-10-CM

## 2016-11-17 DIAGNOSIS — M7989 Other specified soft tissue disorders: Secondary | ICD-10-CM | POA: Insufficient documentation

## 2016-11-17 DIAGNOSIS — Z1211 Encounter for screening for malignant neoplasm of colon: Secondary | ICD-10-CM

## 2016-11-17 DIAGNOSIS — M25449 Effusion, unspecified hand: Secondary | ICD-10-CM

## 2016-11-17 DIAGNOSIS — R202 Paresthesia of skin: Secondary | ICD-10-CM | POA: Insufficient documentation

## 2016-11-17 DIAGNOSIS — R05 Cough: Secondary | ICD-10-CM | POA: Insufficient documentation

## 2016-11-17 DIAGNOSIS — Z0000 Encounter for general adult medical examination without abnormal findings: Secondary | ICD-10-CM

## 2016-11-17 DIAGNOSIS — Z87891 Personal history of nicotine dependence: Secondary | ICD-10-CM

## 2016-11-17 DIAGNOSIS — M25552 Pain in left hip: Secondary | ICD-10-CM | POA: Insufficient documentation

## 2016-11-17 DIAGNOSIS — M79602 Pain in left arm: Secondary | ICD-10-CM

## 2016-11-17 DIAGNOSIS — M79601 Pain in right arm: Secondary | ICD-10-CM

## 2016-11-17 DIAGNOSIS — G8929 Other chronic pain: Secondary | ICD-10-CM

## 2016-11-17 DIAGNOSIS — M25551 Pain in right hip: Secondary | ICD-10-CM | POA: Insufficient documentation

## 2016-11-17 DIAGNOSIS — M25561 Pain in right knee: Secondary | ICD-10-CM | POA: Insufficient documentation

## 2016-11-17 MED ORDER — NAPROXEN 500 MG PO TABS: ORAL_TABLET | ORAL | 0 refills | Status: DC

## 2016-11-17 MED ORDER — TRIAMTERENE-HCTZ 37.5-25 MG PO CAPS: 1.0000 | ORAL_CAPSULE | Freq: Every day | ORAL | 2 refills | Status: DC

## 2016-11-17 MED FILL — ?TRIAMTERENE/HCTZ 37.5/25TB: 37.5-25 | 30 days supply | Qty: 30 | Fill #0

## 2016-11-17 MED FILL — NAPROXEN 500 MG TABLET: 500 | 30 days supply | Qty: 60 | Fill #0

## 2016-11-17 NOTE — Progress Notes (Signed)
Subjective:  Patient ID: [name], female    DOB: 09-07-1960  Age: 56 y.o. MRN: [id number]  CC: Hip Pain   HPI [name] presents for complains of bilateral  hip pain. Onset of the symptoms was 3 months ago. Inciting event: none.. Associated symptoms: stiffness and locking. Patient's course of pain: gradually worsening and course of stiffness: gradually worsening.Patient has not had prior hip problems. Previous visits for this problem: none. Evaluation to date: none.  Treatment to date: OTC analgesics, which have been not very effective.  Patient complains of cough.  Symptoms began 2 to 3 years ago .The cough is non-productive, without wheezing, dyspnea or hemoptysis, is gradually worsening.  She denies any fevers, chills, or night sweats. Patient does not have a history of asthma. Patient does not have a history of environmental allergens. Patient does not have a history of recent travel. Patient does have a history of smoking. Patient  does not have previous Chest X-ray.  Patient presents for presents evaluation of hand paresthesias and and arm paresthias.  Onset of the symptoms was 1 years ago. Current symptoms include pain, tingling, weakness and pain. Pain 8/10. Inciting event/aggravating factors: none known. Patient's course of GH:WEXHBZJIR worsening. Evaluation to date: none.  Treatment to date: OTC analgesics, which has been not very effective.      Outpatient Medications Prior to Visit  Medication Sig Dispense Refill  . atorvastatin (LIPITOR) 10 MG tablet Take 1 tablet (10 mg total) by mouth daily. (Patient not taking: Reported on 02/04/2016) 90 tablet 3  . cyclobenzaprine (FLEXERIL) 10 MG tablet Take 1 tablet (10 mg total) by mouth at bedtime as needed for muscle spasms. (Patient not taking: Reported on 11/17/2016) 7 tablet 0  . omeprazole (PRILOSEC) 20 MG capsule Take 1 capsule (20 mg total) by mouth daily. (Patient not taking: Reported on 11/17/2016) 30 capsule 5  . naproxen  (NAPROSYN) 375 MG tablet Take 1 tablet (375 mg total) by mouth 2 (two) times daily. (Patient not taking: Reported on 11/17/2016) 14 tablet 0  . triamterene-hydrochlorothiazide (DYAZIDE) 37.5-25 MG capsule Take 1 each (1 capsule total) by mouth daily. (Patient not taking: Reported on 02/04/2016) 30 capsule 3   No facility-administered medications prior to visit.     ROS Review of Systems  Constitutional: Negative.   Respiratory: Negative.   Cardiovascular: Negative.   Gastrointestinal: Negative.   Musculoskeletal: Positive for arthralgias, joint swelling and myalgias.  Skin: Negative.   Neurological:       Paresthesias    Objective:  BP (!) 146/80 (BP Location: Left Arm, Patient Position: Sitting, Cuff Size: Normal)   Pulse 64   Temp 98.3 F (36.8 C) (Oral)   Resp 18   Ht 5\' 11"  (1.803 m)   Wt 209 lb 12.8 oz (95.2 kg)   SpO2 96%   BMI 29.26 kg/m   BP/Weight 11/17/2016 07/13/2016 67/89/3810  Systolic BP 175 102 585  Diastolic BP 80 63 63  Wt. (Lbs) 209.8 240 -  BMI 29.26 33.47 -     Physical Exam  Constitutional: She appears well-developed and well-nourished.  Eyes: Pupils are equal, round, and reactive to light. Conjunctivae are normal.  Neck: Normal range of motion. Neck supple. No JVD present.  Cardiovascular: Normal rate, regular rhythm, normal heart sounds and intact distal pulses.   Pulmonary/Chest: Effort normal and breath sounds normal.  Abdominal: Soft. Bowel sounds are normal. There is no tenderness.  Musculoskeletal:       Right hand: She  exhibits decreased range of motion and swelling. Decreased strength (4/5 muscle strength) noted.       Left hand: She exhibits decreased range of motion and swelling. Decreased strength (4/5 muscle strength) noted.  Bilateral hip pain with abduction. Negative tinel's sign. Positive phalen's sign.    Skin: Skin is warm and dry.  Nursing note and vitals reviewed.  Assessment & Plan:   1. Chronic arthralgias of knees and  hips  - ANA - Rheumatoid factor - naproxen (NAPROSYN) 500 MG tablet; TAKE ONE TABLET TWICE A DAY WITH MEALS FOR 10 DAYS. THEN TAKE ONE TABLET ONCE DAILY WITH MEAL AS NEEDED.  Dispense: 60 tablet; Refill: 0 - DG HIP UNILAT W OR W/O PELVIS 2-3 VIEWS RIGHT; Future - DG HIP UNILAT W OR W/O PELVIS 2-3 VIEWS LEFT; Future  2. Essential hypertension Schedule BP recheck in 2 weeks with clinic RN. If BP is greater than 90/60 (MAP 65 or greater) but not less than 130/80 may add dose of amlodipine 5 mg QD and recheck in another 2 weeks clinic RN.  - triamterene-hydrochlorothiazide (DYAZIDE) 37.5-25 MG capsule; Take 1 each (1 capsule total) by mouth daily.  Dispense: 30 capsule; Refill: 2 - Lipid Panel - Microalbumin/Creatinine Ratio, Urine - CMP and Liver  3. Swelling of finger joint, unspecified laterality  - Uric Acid - ANA - Rheumatoid factor  4. Paresthesia and pain of both upper extremities Refer for nerve conduction studies - Ambulatory referral to Neurology  5. History of chronic cough  - DG Chest 1 View; Future  6. Former smoker  - DG Chest 1 View; Future  7. Healthcare maintenance  - HIV antibody (with reflex) - Hepatitis C Antibody  8. Screening for breast cancer  - MM SCREENING BREAST TOMO BILATERAL; Future  9. Screening for colon cancer  - Ambulatory referral to Gastroenterology   Meds ordered this encounter  Medications  . naproxen (NAPROSYN) 500 MG tablet    Sig: TAKE ONE TABLET TWICE A DAY WITH MEALS FOR 10 DAYS. THEN TAKE ONE TABLET ONCE DAILY WITH MEAL AS NEEDED.    Dispense:  60 tablet    Refill:  0    Order Specific Question:   Supervising Provider    Answer:   Tresa Garter [id number]  . triamterene-hydrochlorothiazide (DYAZIDE) 37.5-25 MG capsule    Sig: Take 1 each (1 capsule total) by mouth daily.    Dispense:  30 capsule    Refill:  2    Order Specific Question:   Supervising Provider    Answer:   Tresa Garter [[id number]]     Follow-up: Return in about 2 weeks (around 12/01/2016), or if symptoms worsen or fail to improve, for BP check with [place].   Alfonse Spruce FNP

## 2016-11-17 NOTE — Patient Instructions (Signed)
Joint Pain Joint pain can be caused by many things. The joint can be bruised, infected, weak from aging, or sore from exercise. The pain will probably go away if you follow your doctor's instructions for home care. If your joint pain continues, more tests may be needed to help find the cause of your condition. Follow these instructions at home: Watch your condition for any changes. Follow these instructions as told to lessen the pain that you are feeling:  Take medicines only as told by your doctor.  Rest the sore joint for as long as told by your doctor. If your doctor tells you to, raise (elevate) the painful joint above the level of your heart while you are sitting or lying down.  Do not do things that cause pain or make the pain worse.  If told, put ice on the painful area: ? Put ice in a plastic bag. ? Place a towel between your skin and the bag. ? Leave the ice on for 20 minutes, 2-3 times per day.  Wear an elastic bandage, splint, or sling as told by your doctor. Loosen the bandage or splint if your fingers or toes lose feeling (become numb) and tingle, or if they turn cold and blue.  Begin exercising or stretching the joint as told by your doctor. Ask your doctor what types of exercise are safe for you.  Keep all follow-up visits as told by your doctor. This is important.  Contact a doctor if:  Your pain gets worse and medicine does not help it.  Your joint pain does not get better in 3 days.  You have more bruising or swelling.  You have a fever.  You lose 10 pounds (4.5 kg) or more without trying. Get help right away if:  You are not able to move the joint.  Your fingers or toes become numb or they turn cold and blue. This information is not intended to replace advice given to you by your health care provider. Make sure you discuss any questions you have with your health care provider. Document Released: 02/10/2009 Document Revised: 07/31/2015 Document Reviewed:  12/04/2013 Elsevier Interactive Patient Education  2018 Elsevier Inc.  

## 2016-11-17 NOTE — Progress Notes (Signed)
Patient is here for whole body pain joint pain & hip pain   Patient been without BP med for more than a year

## 2016-11-18 LAB — CMP AND LIVER
ALBUMIN: 4.5 g/dL (ref 3.5–5.5)
ALT: 14 IU/L (ref 0–32)
AST: 12 IU/L (ref 0–40)
Alkaline Phosphatase: 67 IU/L (ref 39–117)
BUN: 13 mg/dL (ref 6–24)
Bilirubin Total: 0.5 mg/dL (ref 0.0–1.2)
Bilirubin, Direct: 0.12 mg/dL (ref 0.00–0.40)
CALCIUM: 9.8 mg/dL (ref 8.7–10.2)
CO2: 22 mmol/L (ref 20–29)
CREATININE: 0.83 mg/dL (ref 0.57–1.00)
Chloride: 105 mmol/L (ref 96–106)
GFR calc Af Amer: 92 mL/min/{1.73_m2} (ref >59)
GFR, EST NON AFRICAN AMERICAN: 80 mL/min/{1.73_m2} (ref >59)
GLUCOSE: 129 mg/dL — AB (ref 65–99)
POTASSIUM: 4 mmol/L (ref 3.5–5.2)
SODIUM: 141 mmol/L (ref 134–144)
TOTAL PROTEIN: 7.2 g/dL (ref 6.0–8.5)

## 2016-11-18 LAB — LIPID PANEL
CHOL/HDL RATIO: 2.5 ratio (ref 0.0–4.4)
Cholesterol, Total: 214 mg/dL — ABNORMAL HIGH (ref 100–199)
HDL: 84 mg/dL (ref >39)
LDL Calculated: 106 mg/dL — ABNORMAL HIGH (ref 0–99)
Triglycerides: 122 mg/dL (ref 0–149)
VLDL Cholesterol Cal: 24 mg/dL (ref 5–40)

## 2016-11-18 LAB — ANA: ANA: NEGATIVE

## 2016-11-18 LAB — HEPATITIS C ANTIBODY: Hep C Virus Ab: <0.1 s/co ratio (ref 0.0–0.9)

## 2016-11-18 LAB — RHEUMATOID FACTOR: RHEUMATOID FACTOR: 32 [IU]/mL — AB (ref 0.0–13.9)

## 2016-11-18 LAB — HIV ANTIBODY (ROUTINE TESTING W REFLEX): HIV SCREEN 4TH GENERATION: NONREACTIVE

## 2016-11-18 LAB — URIC ACID: URIC ACID: 5.5 mg/dL (ref 2.5–7.1)

## 2016-11-23 ENCOUNTER — Other Ambulatory Visit: Payer: Self-pay | Admitting: Family Medicine

## 2016-11-23 DIAGNOSIS — E785 Hyperlipidemia, unspecified: Secondary | ICD-10-CM

## 2016-11-23 DIAGNOSIS — M0579 Rheumatoid arthritis with rheumatoid factor of multiple sites without organ or systems involvement: Secondary | ICD-10-CM

## 2016-11-23 MED ORDER — ATORVASTATIN CALCIUM 20 MG PO TABS: 20.0000 mg | ORAL_TABLET | Freq: Every day | ORAL | 2 refills | Status: DC

## 2016-11-23 MED ORDER — METHOTREXATE SODIUM 7.5 MG PO TABS: ORAL_TABLET | ORAL | 0 refills | Status: DC

## 2016-11-23 MED FILL — ATORVASTATIN 20 MG TABLET: 20 | 30 days supply | Qty: 30 | Fill #0

## 2016-11-23 MED FILL — METHOTREXATE 2.5 MG TABLET: 2.5 | 30 days supply | Qty: 12 | Fill #0

## 2016-11-24 ENCOUNTER — Ambulatory Visit: Payer: Self-pay | Attending: Family Medicine

## 2016-11-24 NOTE — Telephone Encounter (Signed)
CMA call regarding lab results   Patient Verify DOB   Patient was aware and understood  

## 2016-11-24 NOTE — Telephone Encounter (Signed)
-----   Message from Alfonse Spruce, [place] sent at 11/23/2016  3:49 PM EDT ----- -RF test which evaluates for rheumatoid arthritis is positive. This is an autoimmune disorder that can cause swelling, pain, and deformity of the joints. You will be prescribed methotrexate. Recommend scheduling follow up in 6 to 8 weeks.  -Uric acid test is normal. When uric acid is high it can cause gout.  -ANA test which evaluates diseases or conditions which cause inflammation is negative. HIV is negative.  Hepatitis C is negative.  Lipid levels were elevated. This can increase your risk of heart disease overtime. You will be prescribed atorvastatin to help lower risk. Start eating a diet low in saturated fat. Limit your intake of fried foods, red meats, and whole milk. Increase activity. Kidney function normal Liver function normal

## 2016-11-25 ENCOUNTER — Ambulatory Visit (HOSPITAL_COMMUNITY)
Admission: RE | Admit: 2016-11-25 | Discharge: 2016-11-25 | Disposition: A | Discharge status: Home / Self Care | Payer: Self-pay | Source: Ambulatory Visit | Attending: Family Medicine | Admitting: Family Medicine

## 2016-11-25 ENCOUNTER — Other Ambulatory Visit: Payer: Self-pay | Admitting: Family Medicine

## 2016-11-25 DIAGNOSIS — Z87891 Personal history of nicotine dependence: Secondary | ICD-10-CM | POA: Insufficient documentation

## 2016-11-25 DIAGNOSIS — M25561 Pain in right knee: Principal | ICD-10-CM

## 2016-11-25 DIAGNOSIS — M25562 Pain in left knee: Secondary | ICD-10-CM

## 2016-11-25 DIAGNOSIS — Z87898 Personal history of other specified conditions: Secondary | ICD-10-CM

## 2016-11-25 DIAGNOSIS — M25552 Pain in left hip: Principal | ICD-10-CM

## 2016-11-25 DIAGNOSIS — M25551 Pain in right hip: Secondary | ICD-10-CM | POA: Insufficient documentation

## 2016-11-25 DIAGNOSIS — R05 Cough: Secondary | ICD-10-CM | POA: Insufficient documentation

## 2016-11-25 DIAGNOSIS — G8929 Other chronic pain: Secondary | ICD-10-CM

## 2016-11-25 DIAGNOSIS — Z8709 Personal history of other diseases of the respiratory system: Secondary | ICD-10-CM

## 2016-12-03 ENCOUNTER — Telehealth: Payer: Self-pay

## 2016-12-03 NOTE — Telephone Encounter (Signed)
-----   Message from Alfonse Spruce, [place] sent at 12/03/2016 12:27 PM EDT ----- Chest xray shows Lungs clear, heart is normal size. 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 Hip joints show no evidence of fracture, dislocation, or partial dislocation of the hips, soft tissue is intact.

## 2016-12-03 NOTE — Telephone Encounter (Signed)
CMA call regarding x ray results   Patient verify DOB   Patient was aware and understood

## 2016-12-09 ENCOUNTER — Ambulatory Visit (INDEPENDENT_AMBULATORY_CARE_PROVIDER_SITE_OTHER): Payer: Self-pay | Admitting: Diagnostic Neuroimaging

## 2016-12-09 ENCOUNTER — Telehealth: Payer: Self-pay | Admitting: *Deleted

## 2016-12-09 ENCOUNTER — Ambulatory Visit: Payer: Self-pay | Attending: Family Medicine | Admitting: *Deleted

## 2016-12-09 ENCOUNTER — Encounter (INDEPENDENT_AMBULATORY_CARE_PROVIDER_SITE_OTHER): Payer: Self-pay | Admitting: Diagnostic Neuroimaging

## 2016-12-09 VITALS — BP 130/80 | HR 70

## 2016-12-09 DIAGNOSIS — Z79899 Other long term (current) drug therapy: Secondary | ICD-10-CM | POA: Insufficient documentation

## 2016-12-09 DIAGNOSIS — M79602 Pain in left arm: Secondary | ICD-10-CM

## 2016-12-09 DIAGNOSIS — Z0289 Encounter for other administrative examinations: Secondary | ICD-10-CM

## 2016-12-09 DIAGNOSIS — I10 Essential (primary) hypertension: Secondary | ICD-10-CM | POA: Insufficient documentation

## 2016-12-09 DIAGNOSIS — M79601 Pain in right arm: Secondary | ICD-10-CM

## 2016-12-09 NOTE — Progress Notes (Signed)
Pt arrived to [facility] for nurse visit to check blood pressure. Pt is alert and oriented, NAD and arrives in good spirits. Last OV   with 11/17/2016.  Pt denies chest pain, SOB, HA, dizziness, or blurred vision.  Reviewed medication list with patient. Pt states medication was taken this morning.  Manual blood pressure reading: 130/80 and 126/80  Pt wanted to address hip and back pain. Informed that a message will be sent to PCP.

## 2016-12-09 NOTE — Telephone Encounter (Addendum)
Pt came in today for Nurse visit, BP check. She thought  she was going to see her PCP today. She wanted to address hip and lower back pain since informed of XR's.  She states pain is worse on right side. This bothers her while at work. Pt states she is taking medications as prescribed. Next available appointment November 5th. Please advise.

## 2016-12-10 ENCOUNTER — Other Ambulatory Visit: Payer: Self-pay | Admitting: Family Medicine

## 2016-12-10 DIAGNOSIS — M25551 Pain in right hip: Principal | ICD-10-CM

## 2016-12-10 DIAGNOSIS — M25562 Pain in left knee: Principal | ICD-10-CM

## 2016-12-10 DIAGNOSIS — R768 Other specified abnormal immunological findings in serum: Secondary | ICD-10-CM | POA: Insufficient documentation

## 2016-12-10 DIAGNOSIS — M25552 Pain in left hip: Principal | ICD-10-CM

## 2016-12-10 DIAGNOSIS — G8929 Other chronic pain: Secondary | ICD-10-CM | POA: Insufficient documentation

## 2016-12-10 DIAGNOSIS — M25561 Pain in right knee: Principal | ICD-10-CM

## 2016-12-10 MED ORDER — ACETAMINOPHEN-CODEINE 300-30 MG PO TABS: 1.0000 | ORAL_TABLET | Freq: Four times a day (QID) | ORAL | 0 refills | Status: DC | PRN

## 2016-12-10 NOTE — Telephone Encounter (Addendum)
Pt request Tylenol #3. She is aware this is a narcotic and has to pick up prescription to take to pharmacy of her choice.  Pt aware she cannot take this medication at work or while driving. Pt verbalized understanding. She states pain is worse at night. She will take when needed before bed.

## 2016-12-10 NOTE — Telephone Encounter (Signed)
Prescription script available for pick up today. Narcotic prescription so she must pick up script before clinic closing.

## 2016-12-10 NOTE — Telephone Encounter (Signed)
Per AVS summary she was to follow up with triage RN for BP recheck not PCP.  If she is experiencing pain I can prescribe either Tramadol or Tylenol #3 until next office visit. Recommend she schedule an appointment.

## 2016-12-10 NOTE — Telephone Encounter (Signed)
Rx at front desk

## 2016-12-10 NOTE — Telephone Encounter (Signed)
Pt came in today for Nurse visit, BP check. She thought  she was going to see her PCP today. She wanted to address hip and lower back pain since informed of XR's.  She states pain is worse on right side. This bothers her while at work. Pt states she is taking medications as prescribed. Next available appointment November 5th. Please advise.

## 2016-12-13 NOTE — Procedures (Signed)
[facility]  NCS (NERVE CONDUCTION STUDY) WITH EMG (ELECTROMYOGRAPHY) REPORT   STUDY DATE: 12/09/16 PATIENT NAME: [name] DOB: [birth year] MRN: [id number]  ORDERING CLINICIAN: Gean Quint  TECHNOLOGIST: Oneita Jolly ELECTROMYOGRAPHER: Earlean Polka. Penumalli, MD  CLINICAL INFORMATION: 56 year old female with upper extremity pain and numbness.  FINDINGS: NERVE CONDUCTION STUDY: Right median motor response is prolonged distal latency and normal amplitude.  Left median, bilateral ulnar motor responses are normal.  Bilateral median sensory responses have prolonged peak latencies. Right median sensory response has decreased amplitude. Left median sensory response has normal amplitude.  Bilateral ulnar sensory responses are normal.    NEEDLE ELECTROMYOGRAPHY:  Left deltoid, biceps, triceps, flexor carpi radialis, first dorsal interosseous, cervical paraspinal muscles are normal.   IMPRESSION:   Abnormal study demonstrate: - Mild bilateral median neuropathies at the wrist consistent with mild bilateral carpal tunnel syndrome. This is slightly more affected on the right side than left side.    INTERPRETING PHYSICIAN:  Penni Bombard, MD Certified in Neurology, Neurophysiology and Neuroimaging  [facility] [address], [place], [place] [address] [phone]   [facility]    Nerve / Sites Muscle Latency Ref. Amplitude Ref. Rel Amp Segments Distance Velocity Ref. Area    ms ms mV mV %  cm m/s m/s mVms  R Median - APB     Wrist APB 5.1 ?4.4 5.3 ?4.0 100 Wrist - APB 7   18.0     Upper arm APB 9.5  4.9  92.8 Upper arm - Wrist 22 50 ?49 17.8  L Median - APB     Wrist APB 3.9 ?4.4 8.1 ?4.0 100 Wrist - APB 7   27.4     Upper arm APB 8.2  7.1  88.1 Upper arm - Wrist 22 51 ?49 25.1  R Ulnar - ADM     Wrist ADM 2.6 ?3.3 8.1 ?6.0 100 Wrist - ADM 7   31.7     B.Elbow ADM 6.5  8.0  98.1 B.Elbow - Wrist 21 54 ?49 31.8     A.Elbow  ADM 8.4  7.8  97.3 A.Elbow - B.Elbow 10 51 ?49 31.0         A.Elbow - Wrist      L Ulnar - ADM     Wrist ADM 2.5 ?3.3 8.9 ?6.0 100 Wrist - ADM 7   31.8     B.Elbow ADM 6.6  8.3  92.5 B.Elbow - Wrist 21 52 ?49 29.3     A.Elbow ADM 8.4  8.3  100 A.Elbow - B.Elbow 10 55 ?49 30.7         A.Elbow - Wrist                 SNC    Nerve / Sites Rec. Site Peak Lat Ref.  Amp Ref. Segments Distance    ms ms V V  cm  R Median - Orthodromic (Dig II, Mid palm)     Dig II Wrist 4.9 ?3.4 5 ?10 Dig II - Wrist 13  L Median - Orthodromic (Dig II, Mid palm)     Dig II Wrist 4.0 ?3.4 15 ?10 Dig II - Wrist 13  R Ulnar - Orthodromic, (Dig V, Mid palm)     Dig V Wrist 2.7 ?3.1 8 ?5 Dig V - Wrist 11  L Ulnar - Orthodromic, (Dig V, Mid palm)     Dig V Wrist 2.6 ?3.1 12 ?5 Dig V -  Wrist 69             F  Wave    Nerve F Lat Ref.   ms ms  R Ulnar - ADM 28.6 ?32.0  L Ulnar - ADM 29.1 ?32.0         EMG full       EMG Summary Table    Spontaneous MUAP Recruitment  Muscle IA Fib PSW Fasc Other Amp Dur. Poly Pattern  L. Deltoid Normal None None None _______ Normal Normal Normal Normal  L. Biceps brachii Normal None None None _______ Normal Normal Normal Normal  L. Triceps brachii Normal None None None _______ Normal Normal Normal Normal  L. Flexor carpi radialis Normal None None None _______ Normal Normal Normal Normal  L. First dorsal interosseous Normal None None None _______ Normal Normal Normal Normal  L. Cervical paraspinals Normal None None None _______ Normal Normal Normal Normal

## 2016-12-20 MED FILL — ACETAMINOPHEN/COD #3 TABLET: 300-30 | 5 days supply | Qty: 40 | Fill #0

## 2016-12-31 MED FILL — TRIAMTERENE-HCTZ 37.5-25 MG: 37.5-25 | 30 days supply | Qty: 30 | Fill #1

## 2017-01-10 ENCOUNTER — Ambulatory Visit: Payer: Self-pay | Attending: Family Medicine | Admitting: Licensed Clinical Social Worker

## 2017-01-10 ENCOUNTER — Encounter: Payer: Self-pay | Admitting: Family Medicine

## 2017-01-10 ENCOUNTER — Ambulatory Visit: Payer: Self-pay | Attending: Family Medicine | Admitting: Family Medicine

## 2017-01-10 VITALS — BP 138/85 | HR 75 | Temp 98.8°F | Resp 18 | Ht 71.0 in | Wt 208.0 lb

## 2017-01-10 DIAGNOSIS — Z79899 Other long term (current) drug therapy: Secondary | ICD-10-CM | POA: Insufficient documentation

## 2017-01-10 DIAGNOSIS — M0579 Rheumatoid arthritis with rheumatoid factor of multiple sites without organ or systems involvement: Secondary | ICD-10-CM | POA: Insufficient documentation

## 2017-01-10 DIAGNOSIS — Z1211 Encounter for screening for malignant neoplasm of colon: Secondary | ICD-10-CM

## 2017-01-10 DIAGNOSIS — Z09 Encounter for follow-up examination after completed treatment for conditions other than malignant neoplasm: Secondary | ICD-10-CM

## 2017-01-10 DIAGNOSIS — G5603 Carpal tunnel syndrome, bilateral upper limbs: Secondary | ICD-10-CM | POA: Insufficient documentation

## 2017-01-10 DIAGNOSIS — M2550 Pain in unspecified joint: Secondary | ICD-10-CM | POA: Insufficient documentation

## 2017-01-10 DIAGNOSIS — R52 Pain, unspecified: Secondary | ICD-10-CM

## 2017-01-10 DIAGNOSIS — Z760 Encounter for issue of repeat prescription: Secondary | ICD-10-CM | POA: Insufficient documentation

## 2017-01-10 MED ORDER — NAPROXEN 500 MG PO TABS: ORAL_TABLET | ORAL | 1 refills | Status: DC

## 2017-01-10 MED ORDER — METHOTREXATE SODIUM 2.5 MG PO TABS: ORAL_TABLET | ORAL | 0 refills | Status: DC

## 2017-01-10 MED ORDER — ATORVASTATIN CALCIUM 20 MG PO TABS: 20.0000 mg | ORAL_TABLET | Freq: Every day | ORAL | 2 refills | Status: DC

## 2017-01-10 MED ORDER — WRIST BRACE MISC: 0 refills | Status: DC

## 2017-01-10 MED FILL — ?ATORVASTATIN 20 MG TABLET: 20 | 30 days supply | Qty: 30 | Fill #0

## 2017-01-10 MED FILL — ?METHOTREXATE 2.5 MG TABLET: 2.5 | 30 days supply | Qty: 12 | Fill #0

## 2017-01-10 MED FILL — NAPROXEN 500 MG TABLET: 500 | 15 days supply | Qty: 30 | Fill #0

## 2017-01-10 NOTE — Progress Notes (Signed)
Patient is here for left hand pain worst at night

## 2017-01-10 NOTE — Patient Instructions (Signed)
Carpal Tunnel Syndrome Carpal tunnel syndrome is a condition that causes pain in your hand and arm. The carpal tunnel is a narrow area that is on the palm side of your wrist. Repeated wrist motion or certain diseases may cause swelling in the tunnel. This swelling can pinch the main nerve in the wrist (median nerve). Follow these instructions at home: If you have a splint:  Wear it as told by your doctor. Remove it only as told by your doctor.  Loosen the splint if your fingers: ? Become numb and tingle. ? Turn blue and cold.  Keep the splint clean and dry. General instructions  Take over-the-counter and prescription medicines only as told by your doctor.  Rest your wrist from any activity that may be causing your pain. If needed, talk to your employer about changes that can be made in your work, such as getting a wrist pad to use while typing.  If directed, apply ice to the painful area: ? Put ice in a plastic bag. ? Place a towel between your skin and the bag. ? Leave the ice on for 20 minutes, 2-3 times per day.  Keep all follow-up visits as told by your doctor. This is important.  Do any exercises as told by your doctor, physical therapist, or occupational therapist. Contact a doctor if:  You have new symptoms.  Medicine does not help your pain.  Your symptoms get worse. This information is not intended to replace advice given to you by your health care provider. Make sure you discuss any questions you have with your health care provider. Document Released: 02/11/2011 Document Revised: 07/31/2015 Document Reviewed: 07/10/2014 Elsevier Interactive Patient Education  2018 Elsevier Inc.  

## 2017-01-10 NOTE — BH Specialist Note (Signed)
Integrated Behavioral Health Initial Visit  MRN: [id number] Name: [name]  Number of [place] Clinician visits:: 1/6 Session Start time: 11:15 AM  Session End time: 11:45 AM Total time: 30 minutes  Type of Service: [place] Interpretor:No. Interpretor Name and Language: N/A   Warm Hand Off Completed.       SUBJECTIVE: [name] is a 56 y.o. female accompanied by self Patient was referred by FNP [place] for anxiety. Patient reports the following symptoms/concerns: feelings of worry about finances and health, difficulty sleeping, and chronic pain Duration of problem: "A minute"; Severity of problem: mild  OBJECTIVE: Mood: Anxious and Affect: Appropriate Risk of harm to self or others: No plan to harm self or others  LIFE CONTEXT: Family and Social: Pt receives emotional support from older sister who resides in [place] School/Work: Pt is employed full time. She receives CA[employer] 100% Self-Care: Pt listens to music, watches television, and takes medicine to cope with chronic pain Life Changes: Pt is experiencing increased anxiety due to ongoing medical concerns   GOALS ADDRESSED: Patient will: 1. Reduce symptoms of: anxiety 2. Increase knowledge and/or ability of: coping skills  3. Demonstrate ability to: Increase adequate support systems for patient/family  INTERVENTIONS: Interventions utilized: Solution-Focused Strategies, Supportive Counseling and Psychoeducation and/or Health Education  Standardized Assessments completed: GAD-7 and PHQ 2&9  ASSESSMENT: Patient currently experiencing anxiety triggered by chronic pain. She reports stress about finances in the event she is unable to work due to ongoing medical concerns. Pt receives emotional support from sibling who resides out of state. Denied SI/HI/AVH.    Patient may benefit from psychoeducation. LCSWA educated pt on the correlation between one's physical  and mental health. LCSWA provided interventions to cope with chronic pain, in addition to medication. Pt identified triggers and successfully identified healthy coping skills to decrease symptoms.   PLAN: 1. Follow up with behavioral health clinician on : Pt was encouraged to contact LCSWA if symptoms worsen or fail to improve to schedule behavioral appointments at [facility]. 2. Behavioral recommendations: LCSWA recommends that pt apply healthy coping skills discussed. Pt is encouraged to schedule follow up appointment with LCSWA 3. Referral(s): na 4. "From scale of 1-10, how likely are you to follow plan?": 8/10  Rebekah Chesterfield, LCSW 01/10/17 3:01 PM

## 2017-01-10 NOTE — Progress Notes (Signed)
Subjective:  Patient ID: [name], female    DOB: 07/21/1960  Age: 56 y.o. MRN: [id number]  CC: Follow-up   HPI ARELYS GLASSCO presents for complains of follow up. History  of hand paresthesias and and arm paresthias.  Onset of the symptoms was 1 years ago. Current symptoms include pain, tingling, weakness and pain. Pain 8/10. Inciting event/aggravating factors: none known. Patient's course of OZ:[id number] worsening. Evaluation to date: electromyography (EMG)and nerve conduction studies (NCS): abnormal: mild carpal tunnel right greater than left.  Treatment to date: OTC analgesics, which has been not very effective.   Outpatient Medications Prior to Visit  Medication Sig Dispense Refill  . triamterene-hydrochlorothiazide (DYAZIDE) 37.5-25 MG capsule Take 1 each (1 capsule total) by mouth daily. 30 capsule 2  . atorvastatin (LIPITOR) 20 MG tablet Take 1 tablet (20 mg total) by mouth daily. 30 tablet 2  . methotrexate (RHEUMATREX) 7.5 MG tablet TAKE ONE TABLET BY MOUTH ONCE A WEEK. Caution" Chemotherapy. Protect from light. 8 tablet 0  . naproxen (NAPROSYN) 500 MG tablet TAKE ONE TABLET TWICE A DAY WITH MEALS FOR 10 DAYS. THEN TAKE ONE TABLET ONCE DAILY WITH MEAL AS NEEDED. 60 tablet 0  . Acetaminophen-Codeine (TYLENOL/CODEINE #3) 300-30 MG tablet Take 1-2 tablets by mouth every 6 (six) hours as needed (severe pain). 40 tablet 0  . omeprazole (PRILOSEC) 20 MG capsule Take 1 capsule (20 mg total) by mouth daily. (Patient not taking: Reported on 11/17/2016) 30 capsule 5  . cyclobenzaprine (FLEXERIL) 10 MG tablet Take 1 tablet (10 mg total) by mouth at bedtime as needed for muscle spasms. (Patient not taking: Reported on 11/17/2016) 7 tablet 0   No facility-administered medications prior to visit.     ROS Review of Systems  Constitutional: Negative.   Respiratory: Negative.   Cardiovascular: Negative.   Gastrointestinal: Negative.   Musculoskeletal: Positive for arthralgias, joint  swelling and myalgias.  Skin: Negative.   Neurological:       Paresthesias    Objective:  BP 138/85 (BP Location: Left Arm, Patient Position: Sitting, Cuff Size: Normal)   Pulse 75   Temp 98.8 F (37.1 C) (Oral)   Resp 18   Ht 5\' 11"  (1.803 m)   Wt 208 lb (94.3 kg)   SpO2 97%   BMI 29.01 kg/m   BP/Weight 01/10/2017 12/09/2016 06/30/9561  Systolic BP 875 643 329  Diastolic BP 85 80 80  Wt. (Lbs) 208 - 209.8  BMI 29.01 - 29.26     Physical Exam  Constitutional: She appears well-developed and well-nourished.  Eyes: Conjunctivae are normal. Pupils are equal, round, and reactive to light.  Neck: Normal range of motion. Neck supple. No JVD present.  Cardiovascular: Normal rate, regular rhythm, normal heart sounds and intact distal pulses.  Pulmonary/Chest: Effort normal and breath sounds normal.  Abdominal: Soft. Bowel sounds are normal. There is no tenderness.  Musculoskeletal:       Right hand: Decreased strength (4/5 muscle strength) noted.       Left hand: She exhibits swelling (joint).   Positive phalen's sign.    Skin: Skin is warm and dry.  Nursing note and vitals reviewed.  Assessment & Plan:   1. Follow up Referral for MM screen patient concerned about cost. MM scholarship given.  2. Bilateral carpal tunnel syndrome Recent NCS showed mild carpal tunnel right greater than left. Increase naprosyn BID, provide bilateral wrist brace. If symptoms don't improve ortho referral Gabapentin offered pt.refused. - naproxen (NAPROSYN) 500 MG  tablet; TAKE ONE TABLET TWICE A DAY WITH MEALS AS NEEDED FOR PAIN.  Dispense: 30 tablet; Refill: 1 - Misc. Devices (WRIST BRACE) MISC; APPLY BRACE TO BILATERAL WRIST FOR CARPAL TUNNEL.TO BE FITTED BY MEDICAL SUPPLY.  Dispense: 2 each; Refill: 0  3. Arthralgia, unspecified joint  - naproxen (NAPROSYN) 500 MG tablet; TAKE ONE TABLET TWICE A DAY WITH MEALS AS NEEDED FOR PAIN.  Dispense: 30 tablet; Refill: 1  4. Medication refill  -  methotrexate 2.5 MG tablet; TAKE THREE TABLETS AT ONCE BY MOUTH ONCE A WEEK. Caution" Chemotherapy. Protect from light.  Dispense: 24 tablet; Refill: 0 - atorvastatin (LIPITOR) 20 MG tablet; Take 1 tablet (20 mg total) daily by mouth.  Dispense: 30 tablet; Refill: 2  5. Screening for colon cancer  - Ambulatory referral to Gastroenterology     Follow-up: Return in about 8 weeks (around 03/07/2017) for Carpal Tunnel.   Alfonse Spruce FNP

## 2017-03-09 MED FILL — ?METHOTREXATE 2.5 MG TABLET: 2.5 | 30 days supply | Qty: 12 | Fill #1

## 2017-03-09 MED FILL — ?TRIAMTERENE/HCTZ 37.5/25TB: 37.5-25 | 30 days supply | Qty: 30 | Fill #2

## 2017-03-10 ENCOUNTER — Ambulatory Visit: Payer: Self-pay | Admitting: Family Medicine

## 2017-05-31 ENCOUNTER — Other Ambulatory Visit: Payer: Self-pay | Admitting: Pharmacist

## 2017-05-31 DIAGNOSIS — I10 Essential (primary) hypertension: Secondary | ICD-10-CM

## 2017-05-31 MED ORDER — TRIAMTERENE-HCTZ 37.5-25 MG PO CAPS: 1.0000 | ORAL_CAPSULE | Freq: Every day | ORAL | 0 refills | Status: DC

## 2017-05-31 MED FILL — TRIAMTERENE/HCTZ 37.5/25 TB: 37.5-25 | 30 days supply | Qty: 30 | Fill #0

## 2017-11-29 ENCOUNTER — Encounter: Payer: Self-pay | Admitting: Family Medicine

## 2017-11-29 ENCOUNTER — Ambulatory Visit: Payer: Self-pay | Attending: Family Medicine | Admitting: Family Medicine

## 2017-11-29 VITALS — BP 151/90 | HR 65 | Temp 98.4°F | Resp 16 | Ht 71.0 in | Wt 204.4 lb

## 2017-11-29 DIAGNOSIS — M0579 Rheumatoid arthritis with rheumatoid factor of multiple sites without organ or systems involvement: Secondary | ICD-10-CM

## 2017-11-29 DIAGNOSIS — G5603 Carpal tunnel syndrome, bilateral upper limbs: Secondary | ICD-10-CM

## 2017-11-29 DIAGNOSIS — I10 Essential (primary) hypertension: Secondary | ICD-10-CM

## 2017-11-29 DIAGNOSIS — K5900 Constipation, unspecified: Secondary | ICD-10-CM

## 2017-11-29 DIAGNOSIS — Z23 Encounter for immunization: Secondary | ICD-10-CM | POA: Insufficient documentation

## 2017-11-29 MED ORDER — IBUPROFEN 600 MG PO TABS: 600.0000 mg | ORAL_TABLET | Freq: Three times a day (TID) | ORAL | 1 refills | Status: DC | PRN

## 2017-11-29 MED ORDER — PREDNISONE 20 MG PO TABS: ORAL_TABLET | ORAL | 0 refills | Status: DC

## 2017-11-29 MED ORDER — TETANUS-DIPHTH-ACELL PERTUSSIS 5-2.5-18.5 LF-MCG/0.5 IM SUSP: 0.5000 mL | Freq: Once | INTRAMUSCULAR | 0 refills | Status: AC

## 2017-11-29 MED ORDER — POLYETHYLENE GLYCOL 3350 17 GM/SCOOP PO POWD: 17.0000 g | Freq: Once | ORAL | 6 refills | Status: AC

## 2017-11-29 MED ORDER — TRIAMTERENE-HCTZ 37.5-25 MG PO CAPS: 1.0000 | ORAL_CAPSULE | Freq: Every day | ORAL | 6 refills | Status: DC

## 2017-11-29 MED FILL — IBUPROFEN 600 MG TABLET: 600 | 20 days supply | Qty: 60 | Fill #0

## 2017-11-29 MED FILL — predniSONE 20 MG TABS: 20 | 8 days supply | Qty: 8 | Fill #0

## 2017-11-29 MED FILL — TRIAMTERENE/HCTZ 37.5/25 TB: 37.5-25 | 30 days supply | Qty: 30 | Fill #0

## 2017-11-29 NOTE — Progress Notes (Signed)
Pt states she has pain coming from her whole body  Pt states she is having pain in the ball joint of the left hand by thumb  Pt states the tylenol 3 doesn't help

## 2017-11-29 NOTE — Patient Instructions (Signed)
Td Vaccine (Tetanus and Diphtheria): What You Need to Know 1. Why get vaccinated? Tetanus  and diphtheria are very serious diseases. They are rare in the [place] today, but people who do become infected often have severe complications. Td vaccine is used to protect adolescents and adults from both of these diseases. Both tetanus and diphtheria are infections caused by bacteria. Diphtheria spreads from person to person through coughing or sneezing. Tetanus-causing bacteria enter the body through cuts, scratches, or wounds. TETANUS (lockjaw) causes painful muscle tightening and stiffness, usually all over the body.  It can lead to tightening of muscles in the head and neck so you can't open your mouth, swallow, or sometimes even breathe. Tetanus kills about 1 out of every 10 people who are infected even after receiving the best medical care.  DIPHTHERIA can cause a thick coating to form in the back of the throat.  It can lead to breathing problems, paralysis, heart failure, and death.  Before vaccines, as many as 200,000 cases of diphtheria and hundreds of cases of tetanus were reported in the [place] each year. Since vaccination began, reports of cases for both diseases have dropped by about 99%. 2. Td vaccine Td vaccine can protect adolescents and adults from tetanus and diphtheria. Td is usually given as a booster dose every 10 years but it can also be given earlier after a severe and dirty wound or burn. Another vaccine, called Tdap, which protects against pertussis in addition to tetanus and diphtheria, is sometimes recommended instead of Td vaccine. Your doctor or the person giving you the vaccine can give you more information. Td may safely be given at the same time as other vaccines. 3. Some people should not get this vaccine  A person who has ever had a life-threatening allergic reaction after a previous dose of any tetanus or diphtheria containing vaccine, OR has a severe  allergy to any part of this vaccine, should not get Td vaccine. Tell the person giving the vaccine about any severe allergies.  Talk to your doctor if you: ? had severe pain or swelling after any vaccine containing diphtheria or tetanus, ? ever had a condition called Guillain Barre Syndrome (GBS), ? aren't feeling well on the day the shot is scheduled. 4. What are the risks from Td vaccine? With any medicine, including vaccines, there is a chance of side effects. These are usually mild and go away on their own. Serious reactions are also possible but are rare. Most people who get Td vaccine do not have any problems with it. Mild problems following Td vaccine: (Did not interfere with activities)  Pain where the shot was given (about 8 people in 10)  Redness or swelling where the shot was given (about 1 person in 4)  Mild fever (rare)  Headache (about 1 person in 4)  Tiredness (about 1 person in 4)  Moderate problems following Td vaccine: (Interfered with activities, but did not require medical attention)  Fever over 102F (rare)  Severe problems following Td vaccine: (Unable to perform usual activities; required medical attention)  Swelling, severe pain, bleeding and/or redness in the arm where the shot was given (rare).  Problems that could happen after any vaccine:  People sometimes faint after a medical procedure, including vaccination. Sitting or lying down for about 15 minutes can help prevent fainting, and injuries caused by a fall. Tell your doctor if you feel dizzy, or have vision changes or ringing in the ears.  Some people get   severe pain in the shoulder and have difficulty moving the arm where a shot was given. This happens very rarely.  Any medication can cause a severe allergic reaction. Such reactions from a vaccine are very rare, estimated at fewer than 1 in a million doses, and would happen within a few minutes to a few hours after the vaccination. As with any  medicine, there is a very remote chance of a vaccine causing a serious injury or death. The safety of vaccines is always being monitored. For more information, visit: www.cdc.gov/vaccinesafety/ 5. What if there is a serious reaction? What should I look for? Look for anything that concerns you, such as signs of a severe allergic reaction, very high fever, or unusual behavior. Signs of a severe allergic reaction can include hives, swelling of the face and throat, difficulty breathing, a fast heartbeat, dizziness, and weakness. These would usually start a few minutes to a few hours after the vaccination. What should I do?  If you think it is a severe allergic reaction or other emergency that can't wait, call 9-1-1 or get the person to the nearest hospital. Otherwise, call your doctor.  Afterward, the reaction should be reported to the Vaccine Adverse Event Reporting System (VAERS). Your doctor might file this report, or you can do it yourself through the VAERS web site at www.vaers.hhs.gov, or by calling 1-800-822-7967. ? VAERS does not give medical advice. 6. The National Vaccine Injury Compensation Program The National Vaccine Injury Compensation Program (VICP) is a federal program that was created to compensate people who may have been injured by certain vaccines. Persons who believe they may have been injured by a vaccine can learn about the program and about filing a claim by calling 1-800-338-2382 or visiting the VICP website at www.hrsa.gov/vaccinecompensation. There is a time limit to file a claim for compensation. 7. How can I learn more?  Ask your doctor. He or she can give you the vaccine package insert or suggest other sources of information.  Call your local or state health department.  Contact the Centers for Disease Control and Prevention (CDC): ? Call 1-800-232-4636 (1-800-CDC-INFO) ? Visit CDC's website at www.cdc.gov/vaccines CDC Td Vaccine VIS (06/17/15) This information is  not intended to replace advice given to you by your health care provider. Make sure you discuss any questions you have with your health care provider. Document Released: 12/20/2005 Document Revised: 11/13/2015 Document Reviewed: 11/13/2015 Elsevier Interactive Patient Education  2017 Elsevier Inc.  

## 2017-12-01 ENCOUNTER — Encounter: Payer: Self-pay | Admitting: Family Medicine

## 2017-12-01 NOTE — Progress Notes (Signed)
Subjective:    Patient ID: [name], female    DOB: 07-26-1960, 57 y.o.   MRN: [id number]  HPI 57 year old female who was last seen here in this office on 01/10/2017 who presents in follow-up of chronic medical problems including rheumatoid arthritis, carpal tunnel syndrome and hypertension.  Patient states that she has been out of all of her medications for months.  Patient states that she is currently aching all over but especially in her hands and the base of her right thumb as well as the right middle finger.  Patient states that she has been taking over-the-counter BC powder as well as Tylenol arthritis which decrease the pain but do not cause the pain to go away.  Patient states she was prescribed Tylenol 3 in the past but this did not really help.  Patient states that she has had some occasional headaches related to her blood pressure since she has been out of her medications.  Patient states that she would like to get back on her medications to help control her blood pressure as well as to help with her arthritis pain.  Upon questioning, patient states that she did not take the methotrexate for very long that was prescribed in the past.  Patient states that she was told that the medication was a form of chemotherapy which scared her.      Patient believes she may have been referred to rheumatology in the past but could not attend as the appointment was not in the [place] area and she had issues with transportation.  Patient states that transportation issues also prevent her from having her colonoscopy.  Patient states that she is having issues with constipation but has had these issues off and on for years.  Patient reports that in the past she was placed on MiraLAX which did help and patient would like to have a prescription for this medication.  Patient denies any blood in the stools, no black stools and no abdominal pain.  Patient has had no change in bowel habits and no loss of  appetite. Past Medical History:  Diagnosis Date  . Bilateral carpal tunnel syndrome   . Hypercholesteremia   . Hypertension   . OAB (overactive bladder)   . Rheumatoid arthritis [facility])    Past Surgical History:  Procedure Laterality Date  . ABDOMINAL HYSTERECTOMY     Family History  Problem Relation Age of Onset  . Hypertension Mother   . Diabetes Mother   . Cancer Father    Social History   Tobacco Use  . Smoking status: Never Smoker  . Smokeless tobacco: Never Used  Substance Use Topics  . Alcohol use: Yes    Comment: occasonl  . Drug use: Yes    Frequency: 2.0 times per week    Types: Marijuana  No Known Allergies    Review of Systems  Constitutional: Positive for fatigue. Negative for chills and fever.  Respiratory: Negative for cough and shortness of breath.   Cardiovascular: Negative for chest pain, palpitations and leg swelling.  Gastrointestinal: Positive for constipation. Negative for abdominal pain and blood in stool.  Genitourinary: Negative for dysuria and frequency.  Musculoskeletal: Positive for arthralgias, back pain, joint swelling and myalgias. Negative for gait problem.  Neurological: Positive for headaches. Negative for dizziness.       Objective:   Physical Exam BP (!) 151/90   Pulse 65   Temp 98.4 F (36.9 C) (Oral)   Resp 16   Ht 5\' 11"  (  1.803 m)   Wt 204 lb 6.4 oz (92.7 kg)   SpO2 100%   BMI 28.51 kg/m Vital signs and nurse's note reviewed General-well-nourished, well-developed older female in no acute distress Neck-supple, no lymphadenopathy, no thyromegaly, no carotid bruit Lungs-clear to auscultation bilaterally Cardiovascular-regular rate and rhythm Abdomen-soft, nontender Back-no CVA tenderness, patient with mild lumbosacral discomfort to palpation and mild lumbosacral paraspinous spasm Musculoskeletal- patient with some swelling at the MCP joints bilaterally as well as some swelling of the right middle finger at both the DIP  and PIP joints, patient with edema of the left thumb and thenar eminence.  Patient with tenderness to palpation along the thenar eminence and base of the left thumb.  Patient with bilateral positive Tinel sign at the wrists, left greater than right.  Patient with some mild bilateral knee joint line tenderness Extremities-no lower extremity edema      Assessment & Plan:  1. Essential hypertension Patient's blood pressure is elevated at 151/90 at today's visit but patient states that she has been out of her blood pressure medications for several months.  Patient took Dyazide in the past without any issues.  Patient is given a refill of the medication at today's visit, patient is also encouraged to follow a low sodium diet and to exercise on a regular basis as tolerated once her joint pain is more controlled. - triamterene-hydrochlorothiazide (DYAZIDE) 37.5-25 MG capsule; Take 1 each (1 capsule total) by mouth daily.  Dispense: 30 capsule; Refill: 6  2. Rheumatoid arthritis involving multiple sites with positive rheumatoid factor ([place]) Patient with complaint of aching all over especially at the base of her left thumb and right middle finger as well as knee pain.  Patient has not been on any disease modifying medication for her rheumatoid arthritis.  Patient states that she has been taking over-the-counter Tylenol and BC powders which have not really helped.  Patient will be placed on a short taper of prednisone to help with acute pain and inflammation as well as prescription provided for ibuprofen.  Referral placed for rheumatology but patient states that she may not be able to attend due to transportation issues if the appointment is not in the [facility] area - predniSONE (DELTASONE) 20 MG tablet; Take 2 pills once daily for 2 days then 1 pill daily for 2 days then 1/2 pill daily for 4 days  Dispense: 8 tablet; Refill: 0 - ibuprofen (ADVIL,MOTRIN) 600 MG tablet; Take 1 tablet (600 mg total) by mouth  every 8 (eight) hours as needed for moderate pain. Take after eating  Dispense: 60 tablet; Refill: 1  3. Carpal tunnel syndrome, bilateral Patient with carpal tunnel syndrome with current acute symptoms.  Patient is being placed on prednisone and also provided with prescription for ibuprofen to take as needed for pain.  Patient is encouraged to avoid repetitive hand motions if possible and to OTC wrist splints to wear at night - predniSONE (DELTASONE) 20 MG tablet; Take 2 pills once daily for 2 days then 1 pill daily for 2 days then 1/2 pill daily for 4 days  Dispense: 8 tablet; Refill: 0 - ibuprofen (ADVIL,MOTRIN) 600 MG tablet; Take 1 tablet (600 mg total) by mouth every 8 (eight) hours as needed for moderate pain. Take after eating  Dispense: 60 tablet; Refill: 1  4. Constipation, unspecified constipation type Patient with complaint of issues with constipation and patient states that she has taken MiraLAX in the past with good results.  Patient states that she missed her  colonoscopy that was scheduled due to transportation issues as the colonoscopy was scheduled out of town.  When patient returns for follow-up, will arrange for new referral for colonoscopy - polyethylene glycol powder (GLYCOLAX/MIRALAX) powder; Take 17 g by mouth once for 1 dose. Once or twice per day as needed  Dispense: 3350 g; Refill: 6  *Patient was offered influenza immunization at today's visit which she declined but patient did agree to have Tdap  An After Visit Summary was printed and given to the patient.  Return in about 3 weeks (around 12/20/2017).

## 2017-12-20 ENCOUNTER — Encounter: Payer: Self-pay | Admitting: Family Medicine

## 2017-12-20 ENCOUNTER — Ambulatory Visit: Payer: Self-pay | Attending: Family Medicine | Admitting: Family Medicine

## 2017-12-20 VITALS — BP 121/71 | HR 57 | Temp 98.5°F | Resp 18 | Ht 71.0 in | Wt 202.0 lb

## 2017-12-20 DIAGNOSIS — I10 Essential (primary) hypertension: Secondary | ICD-10-CM

## 2017-12-20 DIAGNOSIS — E785 Hyperlipidemia, unspecified: Secondary | ICD-10-CM

## 2017-12-20 DIAGNOSIS — N3281 Overactive bladder: Secondary | ICD-10-CM | POA: Insufficient documentation

## 2017-12-20 DIAGNOSIS — M0579 Rheumatoid arthritis with rheumatoid factor of multiple sites without organ or systems involvement: Secondary | ICD-10-CM

## 2017-12-20 DIAGNOSIS — G5603 Carpal tunnel syndrome, bilateral upper limbs: Secondary | ICD-10-CM | POA: Insufficient documentation

## 2017-12-20 DIAGNOSIS — E7800 Pure hypercholesterolemia, unspecified: Secondary | ICD-10-CM | POA: Insufficient documentation

## 2017-12-20 DIAGNOSIS — Z79899 Other long term (current) drug therapy: Secondary | ICD-10-CM

## 2017-12-20 MED ORDER — ATORVASTATIN CALCIUM 10 MG PO TABS: 10.0000 mg | ORAL_TABLET | Freq: Every day | ORAL | 6 refills | Status: DC

## 2017-12-20 MED ORDER — DICLOFENAC SODIUM 75 MG PO TBEC: 75.0000 mg | DELAYED_RELEASE_TABLET | Freq: Two times a day (BID) | ORAL | 1 refills | Status: DC

## 2017-12-20 MED FILL — ATORVASTATIN 10 MG TABLET: 10 | 30 days supply | Qty: 30 | Fill #0

## 2017-12-20 MED FILL — DICLOFENAC SOD EC 75 MG TAB: 75 | 30 days supply | Qty: 60 | Fill #0

## 2017-12-20 NOTE — Progress Notes (Signed)
Subjective:    Patient ID: [name], female    DOB: 06/06/1960, 57 y.o.   MRN: [id number]  HPI 57 year old female who was seen in follow-up of rheumatoid arthritis and hypertension status post recent restart of her medication for treatment of hypertension.  Also at patient's last visit, she was referred to rheumatology and in the interim was started on ibuprofen 600 mg and a prednisone taper.  Patient reports that the prednisone taper was very helpful it did decrease her joint pain and swelling.  Patient however with continued pain in her hands/wrist and the outside of her left elbow.  Patient states that the ibuprofen has not really helped with her pain.  Patient reports that she did see rheumatology and had an examination, x-rays of her hands as well as blood work.  Patient states that she has an upcoming appointment which she believes is next week to discuss possible medication.  Patient states that her pain ranges from a 5 to a 10 on a 0-10 pain scale.      Patient reports that she did start taking the blood pressure medication that was prescribed at her last visit.  Patient states that she did take the same medication in the past.  Patient reports that she has had no headaches or dizziness related to her blood pressure and patient reports no muscle cramping related to her use of the blood pressure medication.  Patient states that she used to be on cholesterol medication and she wants to know if this needs to be re-filled  Patient states that she did not eat prior to today's visit.  On review of systems, patient denies any headaches or dizziness, no urinary frequency or dysuria, no abdominal pain, no blood in the stool, no nausea. Past Medical History:  Diagnosis Date  . Bilateral carpal tunnel syndrome   . Hypercholesteremia   . Hypertension   . OAB (overactive bladder)   . Rheumatoid arthritis [facility])    Past Surgical History:  Procedure Laterality Date  . ABDOMINAL HYSTERECTOMY      Family History  Problem Relation Age of Onset  . Hypertension Mother   . Diabetes Mother   . Cancer Father    Social History   Tobacco Use  . Smoking status: Never Smoker  . Smokeless tobacco: Never Used  Substance Use Topics  . Alcohol use: Yes    Comment: occasonl  . Drug use: Yes    Frequency: 2.0 times per week    Types: Marijuana  No Known Allergies  Review of Systems     Objective:   Physical Exam BP 121/71 (BP Location: Left Arm, Patient Position: Sitting, Cuff Size: Normal)   Pulse (!) 57   Temp 98.5 F (36.9 C) (Oral)   Resp 18   Ht 5\' 11"  (1.803 m)   Wt 202 lb (91.6 kg)   SpO2 99%   BMI 28.17 kg/m  Nurse's notes and vital signs reviewed General-well-nourished, well-developed older female in no acute distress but patient appears to be fatigued Lungs-clear to auscultation bilaterally Cardiovascular-regular rate and rhythm Abdomen-soft, nontender Back-no CVA tenderness Musculoskeletal-patient with tenderness at the MCP, PIP and DIP joints with some nodularity at the PIP and DIP joints.  Patient does not have any edema at today's visit.  Patient also with tenderness over the lateral epicondyle of the left elbow.  Patient with tenderness to palpation at the base of the left thumb and over the thenar eminence Extremities-no edema     Assessment &  Plan:  1. Essential hypertension Patient was given prescription to restart Dyazide at her last visit.  Patient states that she has restarted the medication and is having no problems with the medication and feels that her blood pressure is lower.  Blood pressure today's visit is within normal at 121/71.  Patient will have recheck of electrolytes and follow-up of new start of medication.  Medication can cause issues with potassium as well as creatinine therefore labs will be obtained. - Comprehensive metabolic panel  2. Rheumatoid arthritis involving multiple sites with positive rheumatoid factor ([place]) Patient reports  that she did have some improvement in pain and swelling with the use of prednisone however the ibuprofen did not really help.  Patient's ibuprofen will be discontinued and new prescription provided for diclofenac 75 mg twice daily. (Option was not available for the 50 mg 2-3 times per day in the computer system.)  Patient reports that she did see rheumatology recently and had blood work as well as x-rays of her hands however these notes were not available in her chart at today's visit. - Comprehensive metabolic panel - diclofenac (VOLTAREN) 75 MG EC tablet; Take 1 tablet (75 mg total) by mouth 2 (two) times daily. As needed for pain. Eat prior to taking this medication  Dispense: 60 tablet; Refill: 1  3. Hyperlipidemia, unspecified hyperlipidemia type Patient reports a history of hyperlipidemia and on review of chart, patient had LDL of 106 on 11/17/2016.  Patient states that she has not eaten yet this morning and patient will have repeat lipid panel as well as CMP.  Patient's atorvastatin was refilled but at 10 mg daily but patient will be notified if the changes needed in this medication based on today's lipid panel results. - Comprehensive metabolic panel - Lipid panel - atorvastatin (LIPITOR) 10 MG tablet; Take 1 tablet (10 mg total) by mouth daily. To lower cholesterol  Dispense: 30 tablet; Refill: 6  4. Encounter for long-term (current) use of medications Patient will have CMP at today's visit in follow-up of long-term use of medications. - Comprehensive metabolic panel  An After Visit Summary was printed and given to the patient.  Return in about 4 months (around 04/22/2018) for HTN/Joint pain and as needed.

## 2017-12-21 ENCOUNTER — Telehealth: Payer: Self-pay

## 2017-12-21 LAB — COMPREHENSIVE METABOLIC PANEL WITH GFR
ALT: 17 IU/L (ref 0–32)
AST: 19 IU/L (ref 0–40)
Albumin/Globulin Ratio: 1.7 (ref 1.2–2.2)
Albumin: 4.6 g/dL (ref 3.5–5.5)
Alkaline Phosphatase: 59 IU/L (ref 39–117)
BUN/Creatinine Ratio: 16 (ref 9–23)
BUN: 12 mg/dL (ref 6–24)
Bilirubin Total: 0.5 mg/dL (ref 0.0–1.2)
CO2: 20 mmol/L (ref 20–29)
Calcium: 9.9 mg/dL (ref 8.7–10.2)
Chloride: 103 mmol/L (ref 96–106)
Creatinine, Ser: 0.73 mg/dL (ref 0.57–1.00)
GFR calc Af Amer: 107 mL/min/1.73
GFR calc non Af Amer: 93 mL/min/1.73
Globulin, Total: 2.7 g/dL (ref 1.5–4.5)
Glucose: 106 mg/dL — ABNORMAL HIGH (ref 65–99)
Potassium: 4 mmol/L (ref 3.5–5.2)
Sodium: 139 mmol/L (ref 134–144)
Total Protein: 7.3 g/dL (ref 6.0–8.5)

## 2017-12-21 LAB — LIPID PANEL
Chol/HDL Ratio: 2.6 ratio (ref 0.0–4.4)
Cholesterol, Total: 210 mg/dL — ABNORMAL HIGH (ref 100–199)
HDL: 80 mg/dL
LDL Calculated: 112 mg/dL — ABNORMAL HIGH (ref 0–99)
Triglycerides: 91 mg/dL (ref 0–149)
VLDL Cholesterol Cal: 18 mg/dL (ref 5–40)

## 2017-12-21 NOTE — Telephone Encounter (Signed)
-----   Message from Antony Blackbird, MD sent at 12/21/2017 12:03 PM EDT ----- Please notify patient that her blood work indicated a glucose of 106.  If patient was fasting, had not eaten or had any thing to drink containing sugar, within 8 hours of her test then 106 would be considered abnormal.  New guidelines consider anything greater than 100 fasting to be abnormal.  Otherwise, patient with a normal CMP.  Patient's lipid panel with a total cholesterol of 210 and a LDL/bad cholesterol of 112.  Patient should continue a healthy, low-fat diet.  Patient did have prescription for refill of her cholesterol medication done at her recent visit.

## 2017-12-21 NOTE — Telephone Encounter (Signed)
Patient was called, answered, verified dob and was given most recent lab results. Patient verbalized understanding and had no further questions.

## 2018-04-03 ENCOUNTER — Other Ambulatory Visit: Payer: Self-pay

## 2018-04-03 ENCOUNTER — Encounter (HOSPITAL_COMMUNITY): Payer: Self-pay | Admitting: *Deleted

## 2018-04-03 ENCOUNTER — Emergency Department (HOSPITAL_COMMUNITY)
Admission: EM | Admit: 2018-04-03 | Discharge: 2018-04-03 | Disposition: A | Discharge status: Home / Self Care | Payer: BLUE CROSS/BLUE SHIELD | Attending: Emergency Medicine | Admitting: Emergency Medicine

## 2018-04-03 DIAGNOSIS — E785 Hyperlipidemia, unspecified: Secondary | ICD-10-CM

## 2018-04-03 DIAGNOSIS — J069 Acute upper respiratory infection, unspecified: Secondary | ICD-10-CM | POA: Diagnosis not present

## 2018-04-03 DIAGNOSIS — J40 Bronchitis, not specified as acute or chronic: Secondary | ICD-10-CM | POA: Diagnosis not present

## 2018-04-03 DIAGNOSIS — I10 Essential (primary) hypertension: Secondary | ICD-10-CM | POA: Diagnosis not present

## 2018-04-03 DIAGNOSIS — R0981 Nasal congestion: Secondary | ICD-10-CM

## 2018-04-03 DIAGNOSIS — R05 Cough: Secondary | ICD-10-CM | POA: Diagnosis present

## 2018-04-03 DIAGNOSIS — Z79899 Other long term (current) drug therapy: Secondary | ICD-10-CM | POA: Insufficient documentation

## 2018-04-03 MED ORDER — TRIAMTERENE-HCTZ 37.5-25 MG PO CAPS: 1.0000 | ORAL_CAPSULE | Freq: Every day | ORAL | 0 refills | Status: DC

## 2018-04-03 MED ORDER — METHYLPREDNISOLONE 4 MG PO TBPK: ORAL_TABLET | ORAL | 0 refills | Status: DC

## 2018-04-03 MED ORDER — ATORVASTATIN CALCIUM 10 MG PO TABS: 10.0000 mg | ORAL_TABLET | Freq: Every day | ORAL | 0 refills | Status: DC

## 2018-04-03 MED ORDER — FLUTICASONE PROPIONATE 50 MCG/ACT NA SUSP: 1.0000 | Freq: Every day | NASAL | 2 refills | Status: DC

## 2018-04-03 MED FILL — METHYLPREDNISOLONE 4 MG TAB: 4 | 6 days supply | Qty: 21 | Fill #0

## 2018-04-03 NOTE — ED Triage Notes (Signed)
C/o cough and chest congestion onset last week using otc meds without relief. States she now has nasal congestion . C/o itching over her right scapula and states it hurts no rash seen.

## 2018-04-03 NOTE — Discharge Instructions (Signed)
Take Corcidin for your cold.

## 2018-04-03 NOTE — ED Provider Notes (Signed)
[facility] EMERGENCY DEPARTMENT Provider Note   CSN: [id number] Arrival date & time: 04/03/18  0908     History   Chief Complaint Chief Complaint  Patient presents with  . Cough  . Nasal Congestion    HPI [name] is a 58 y.o. female.  Presents with nasal congestion and cough x 1 week.  C/o sinus pressure, productive cough , yellow sputum. No fever, sob, myalgias, urinary sxs, no n/v/d. C/o intching over the R scapula x 2 weeks. Somewhat painful without eruption.  Has been using otc meds including afrin with minimal relief. She is not on methotrexate. She has not been taking  Her regular meds because [facility].  HPI  Past Medical History:  Diagnosis Date  . Bilateral carpal tunnel syndrome   . Hypercholesteremia   . Hypertension   . OAB (overactive bladder)   . Rheumatoid arthritis [facility])     Patient Active Problem List   Diagnosis Date Noted  . Rheumatoid arthritis involving multiple sites with positive rheumatoid factor ([place]) 01/10/2017  . Rheumatoid factor positive 12/10/2016  . Chronic arthralgias of knees and hips 12/10/2016  . 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 Knee pain, chronic 04/02/2015  . DIVERTICULAR DISEASE 11/26/2009  . ALLERGIC RHINITIS 10/16/2009  . RECTAL PAIN 10/16/2009  . CONSTIPATION 03/11/2009  . ANEMIA, IRON DEFICIENCY 02/21/2009  . HYPERTENSION, BENIGN ESSENTIAL 02/19/2009  . GERD 02/19/2009  . VAGINAL PRURITUS 02/19/2009  . FREQUENCY, URINARY 02/19/2009  . MENOPAUSE, SURGICAL 03/08/2004    Past Surgical History:  Procedure Laterality Date  . ABDOMINAL HYSTERECTOMY       OB History   No obstetric history on file.      Home Medications    Prior to Admission medications   Medication Sig Start Date End Date Taking? Authorizing Provider  atorvastatin (LIPITOR) 10 MG tablet Take 1 tablet (10 mg total) by mouth daily. To  lower cholesterol 12/20/17   Fulp, Cammie, MD  diclofenac (VOLTAREN) 75 MG EC tablet Take 1 tablet (75 mg total) by mouth 2 (two) times daily. As needed for pain. Eat prior to taking this medication 12/20/17   Fulp, Cammie, MD  methotrexate 2.5 MG tablet TAKE THREE TABLETS AT ONCE BY MOUTH ONCE A WEEK. Caution" Chemotherapy. Protect from light. Patient not taking: Reported on 11/29/2017 01/10/17   Alfonse Spruce, FNP  Misc. Devices (WRIST BRACE) MISC APPLY BRACE TO BILATERAL WRIST FOR CARPAL TUNNEL.TO BE FITTED BY MEDICAL SUPPLY. Patient not taking: Reported on 11/29/2017 01/10/17   Alfonse Spruce, FNP  omeprazole (PRILOSEC) 20 MG capsule Take 1 capsule (20 mg total) by mouth daily. Patient not taking: Reported on 12/20/2017 03/18/15   Lance Bosch, NP  triamterene-hydrochlorothiazide (DYAZIDE) 37.5-25 MG capsule Take 1 each (1 capsule total) by mouth daily. 11/29/17   Fulp, Cammie, MD  pantoprazole (PROTONIX) 20 MG tablet Take 1 tablet (20 mg total) by mouth daily. Patient not taking: Reported on 02/09/2014 12/24/11 11/03/14  Billy Fischer, MD    Family History Family History  Problem Relation Age of Onset  . Hypertension Mother   . Diabetes Mother   . Cancer Father     Social History Social History   Tobacco Use  . Smoking status: Never Smoker  . Smokeless tobacco: Never Used  Substance Use Topics  . Alcohol use: Yes    Comment: occasonl  . Drug use: Not Currently  Frequency: 2.0 times per week    Types: Marijuana     Allergies   Patient has no known allergies.   Review of Systems Review of Systems  Ten systems reviewed and are negative for acute change, except as noted in the HPI.    Physical Exam Updated Vital Signs BP (!) 162/94 (BP Location: Right Arm)   Pulse (!) 48   Temp 99.4 F (37.4 C) (Oral)   Resp 20   Ht 5\' 11"  (1.803 m)   Wt 99.8 kg   SpO2 99%   BMI 30.68 kg/m   Physical Exam Vitals signs and nursing note reviewed.  Constitutional:       General: She is not in acute distress.    Appearance: She is well-developed. She is not diaphoretic.  HENT:     Head: Normocephalic and atraumatic.  Eyes:     General: No scleral icterus.    Conjunctiva/sclera: Conjunctivae normal.  Neck:     Musculoskeletal: Normal range of motion.  Cardiovascular:     Rate and Rhythm: Normal rate and regular rhythm.     Heart sounds: Normal heart sounds. No murmur. No friction rub. No gallop.   Pulmonary:     Effort: Pulmonary effort is normal. No respiratory distress.     Breath sounds: Normal breath sounds.     Comments: Minimal expiratory wheeze Abdominal:     General: Bowel sounds are normal. There is no distension.     Palpations: Abdomen is soft. There is no mass.     Tenderness: There is no abdominal tenderness. There is no guarding.  Skin:    General: Skin is warm and dry.  Neurological:     Mental Status: She is alert and oriented to person, place, and time.  Psychiatric:        Behavior: Behavior normal.      ED Treatments / Results  Labs (all labs ordered are listed, but only abnormal results are displayed) Labs Reviewed - No data to display  EKG None  Radiology No results found.  Procedures Procedures (including critical care time)  Medications Ordered in ED Medications - No data to display   Initial Impression / Assessment and Plan / ED Course  I have reviewed the triage vital signs and the nursing notes.  Pertinent labs & imaging results that were available during my care of the patient were reviewed by me and considered in my medical decision making (see chart for details).   Patients symptoms are consistent with URI, likely viral etiology. Discussed that antibiotics are not indicated for viral infections.Will treat with medrol dosepak. Pt will be discharged with symptomatic treatment.  Verbalizes understanding and is agreeable with plan. Pt is hemodynamically stable & in NAD prior to dc.   Final Clinical  Impressions(s) / ED Diagnoses   Final diagnoses:  Upper respiratory tract infection, unspecified type  Bronchitis  Nasal congestion    ED Discharge Orders    None       Margarita Mail, PA-C 04/03/18 1601    Gareth Morgan, MD 04/04/18 2050

## 2018-04-07 MED FILL — TRIAMTERENE/HCTZ 37.5/25 TB: 37.5-25 | 30 days supply | Qty: 30 | Fill #0

## 2018-04-24 ENCOUNTER — Ambulatory Visit: Payer: Self-pay | Admitting: Family Medicine

## 2018-06-16 MED FILL — TRIAMTERENE/HCTZ 37.5/25 TB: 37.5-25 | 30 days supply | Qty: 30 | Fill #1

## 2018-07-03 ENCOUNTER — Other Ambulatory Visit: Payer: Self-pay

## 2018-07-03 ENCOUNTER — Ambulatory Visit: Payer: BLUE CROSS/BLUE SHIELD | Attending: Family Medicine | Admitting: Family Medicine

## 2018-07-03 ENCOUNTER — Encounter: Payer: Self-pay | Admitting: Family Medicine

## 2018-07-03 VITALS — BP 128/87 | HR 86 | Temp 98.7°F | Ht 71.0 in | Wt 202.0 lb

## 2018-07-03 DIAGNOSIS — M659 Synovitis and tenosynovitis, unspecified: Secondary | ICD-10-CM

## 2018-07-03 DIAGNOSIS — I10 Essential (primary) hypertension: Secondary | ICD-10-CM

## 2018-07-03 DIAGNOSIS — M0579 Rheumatoid arthritis with rheumatoid factor of multiple sites without organ or systems involvement: Secondary | ICD-10-CM

## 2018-07-03 DIAGNOSIS — G5603 Carpal tunnel syndrome, bilateral upper limbs: Secondary | ICD-10-CM

## 2018-07-03 DIAGNOSIS — M65931 Unspecified synovitis and tenosynovitis, right forearm: Secondary | ICD-10-CM

## 2018-07-03 DIAGNOSIS — M65932 Unspecified synovitis and tenosynovitis, left forearm: Secondary | ICD-10-CM

## 2018-07-03 MED ORDER — PREDNISONE 20 MG PO TABS: ORAL_TABLET | ORAL | 0 refills | Status: DC

## 2018-07-03 MED FILL — predniSONE 20 MG TABS: 20 | 8 days supply | Qty: 8 | Fill #0

## 2018-07-03 NOTE — Patient Instructions (Signed)
Carpal Tunnel Syndrome    Carpal tunnel syndrome is a condition that causes pain in your hand and arm. The carpal tunnel is a narrow area that is on the palm side of your wrist. Repeated wrist motion or certain diseases may cause swelling in the tunnel. This swelling can pinch the main nerve in the wrist (median nerve).  What are the causes?  This condition may be caused by:   Repeated wrist motions.   Wrist injuries.   Arthritis.   A sac of fluid (cyst) or abnormal growth (tumor) in the carpal tunnel.   Fluid buildup during pregnancy.  Sometimes the cause is not known.  What increases the risk?  The following factors may make you more likely to develop this condition:   Having a job in which you move your wrist in the same way many times. This includes jobs like being a butcher or a cashier.   Being a woman.   Having other health conditions, such as:  ? Diabetes.  ? Obesity.  ? A thyroid gland that is not active enough (hypothyroidism).  ? Kidney failure.  What are the signs or symptoms?  Symptoms of this condition include:   A tingling feeling in your fingers.   Tingling or a loss of feeling (numbness) in your hand.   Pain in your entire arm. This pain may get worse when you bend your wrist and elbow for a long time.   Pain in your wrist that goes up your arm to your shoulder.   Pain that goes down into your palm or fingers.   A weak feeling in your hands. You may find it hard to grab and hold items.  You may feel worse at night.  How is this diagnosed?  This condition is diagnosed with a medical history and physical exam. You may also have tests, such as:   Electromyogram (EMG). This test checks the signals that the nerves send to the muscles.   Nerve conduction study. This test checks how well signals pass through your nerves.   Imaging tests, such as X-rays, ultrasound, and MRI. These tests check for what might be the cause of your condition.  How is this treated?  This condition may be treated  with:   Lifestyle changes. You will be asked to stop or change the activity that caused your problem.   Doing exercise and activities that make bones and muscles stronger (physical therapy).   Learning how to use your hand again (occupational therapy).   Medicines for pain and swelling (inflammation). You may have injections in your wrist.   A wrist splint.   Surgery.  Follow these instructions at home:  If you have a splint:   Wear the splint as told by your doctor. Remove it only as told by your doctor.   Loosen the splint if your fingers:  ? Tingle.  ? Lose feeling (become numb).  ? Turn cold and blue.   Keep the splint clean.   If the splint is not waterproof:  ? Do not let it get wet.  ? Cover it with a watertight covering when you take a bath or a shower.  Managing pain, stiffness, and swelling     If told, put ice on the painful area:  ? If you have a removable splint, remove it as told by your doctor.  ? Put ice in a plastic bag.  ? Place a towel between your skin and the bag.  ? Leave the   ice on for 20 minutes, 2-3 times per day.  General instructions   Take over-the-counter and prescription medicines only as told by your doctor.   Rest your wrist from any activity that may cause pain. If needed, talk with your boss at work about changes that can help your wrist heal.   Do any exercises as told by your doctor, physical therapist, or occupational therapist.   Keep all follow-up visits as told by your doctor. This is important.  Contact a doctor if:   You have new symptoms.   Medicine does not help your pain.   Your symptoms get worse.  Get help right away if:   You have very bad numbness or tingling in your wrist or hand.  Summary   Carpal tunnel syndrome is a condition that causes pain in your hand and arm.   It is often caused by repeated wrist motions.   Lifestyle changes and medicines are used to treat this problem. Surgery may help in very bad cases.   Follow your doctor's  instructions about wearing a splint, resting your wrist, keeping follow-up visits, and calling for help.  This information is not intended to replace advice given to you by your health care provider. Make sure you discuss any questions you have with your health care provider.  Document Released: 02/11/2011 Document Revised: 07/01/2017 Document Reviewed: 07/01/2017  [employer] Interactive Patient Education  2019 [employer]

## 2018-07-03 NOTE — Progress Notes (Signed)
Established Patient Office Visit  Subjective:  Patient ID: [name], female    DOB: 1960/04/13  Age: 58 y.o. MRN: [id number]  CC:  Chief Complaint  Patient presents with  . Hand Pain    HPI [name] presents for follow-up of Hypertension and complaint of worsening pain in both hands and well as pain in numbness in her hands which awakens her from sleep. She reports that she has rheumatoid arthritis and saw a specialist last year and she went to the first visit but did not know that she had to pay over $100 co-pay for each additional visit and therefore she was unable to attend her follow-up visit and was never made aware of the results of the x-rays and labs that she had done at her initial visit.       Patient states that she is no longer taking methotrexate. She feels that the diclofenac pills and gel do not help with her pain. She reports that she had nerve testing in the past and was told that she has carpal tunnel syndrome in both wrists/hands and she bought some over the counter braces as recommended but these also did not help. She gets sharp shooting pain in the base of her thumbs and she awakened at night by pain in numbness in her hands. The pain is at minimal a 5 or 6 and at times greater than a 10. 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       She has been taking her blood pressure medication and denies any headaches or dizziness related to her blood pressure  Past Medical History:  Diagnosis Date  . Bilateral carpal tunnel syndrome   . Hypercholesteremia   . Hypertension   . OAB (overactive bladder)   . Rheumatoid arthritis [facility])     Past Surgical History:  Procedure Laterality Date  . ABDOMINAL HYSTERECTOMY      Family History  Problem Relation Age of Onset  . Hypertension Mother   . Diabetes Mother   . Cancer Father     Social History   Tobacco Use  . Smoking status: Never Smoker  . Smokeless  tobacco: Never Used  Substance Use Topics  . Alcohol use: Yes    Comment: occasonl  . Drug use: Not Currently    Frequency: 2.0 times per week    Types: Marijuana    Outpatient Medications Prior to Visit  Medication Sig Dispense Refill  . atorvastatin (LIPITOR) 10 MG tablet Take 1 tablet (10 mg total) by mouth daily. To lower cholesterol 30 tablet 0  . diclofenac (VOLTAREN) 75 MG EC tablet Take 1 tablet (75 mg total) by mouth 2 (two) times daily. As needed for pain. Eat prior to taking this medication 60 tablet 1  . triamterene-hydrochlorothiazide (DYAZIDE) 37.5-25 MG capsule Take 1 each (1 capsule total) by mouth daily. 30 capsule 0  . fluticasone (FLONASE) 50 MCG/ACT nasal spray Place 1 spray into both nostrils daily. (Patient not taking: Reported on 07/03/2018) 9.9 g 2  . methotrexate 2.5 MG tablet TAKE THREE TABLETS AT ONCE BY MOUTH ONCE A WEEK. Caution" Chemotherapy. Protect from light. (Patient not taking: Reported on 11/29/2017) 24 tablet 0  . methylPREDNISolone (MEDROL DOSEPAK) 4 MG TBPK tablet Use as directed (Patient not taking: Reported on 07/03/2018) 21 tablet 0  . Misc. Devices (WRIST BRACE) MISC APPLY BRACE TO BILATERAL WRIST FOR CARPAL TUNNEL.TO BE FITTED BY MEDICAL SUPPLY. (  Patient not taking: Reported on 11/29/2017) 2 each 0  . omeprazole (PRILOSEC) 20 MG capsule Take 1 capsule (20 mg total) by mouth daily. (Patient not taking: Reported on 12/20/2017) 30 capsule 5   No facility-administered medications prior to visit.     No Known Allergies  ROS Review of Systems  Constitutional: Positive for fatigue. Negative for chills and fever.  HENT: Negative for congestion and sore throat.   Respiratory: Negative for cough and shortness of breath.   Cardiovascular: Negative for chest pain and palpitations.  Gastrointestinal: Negative for abdominal pain, constipation, diarrhea, nausea and vomiting.  Endocrine: Negative for polydipsia, polyphagia and polyuria.  Genitourinary:  Negative for dysuria and frequency.  Musculoskeletal: Positive for arthralgias, joint swelling and myalgias. Negative for back pain and gait problem.  Neurological: Negative for dizziness and headaches.  Hematological: Negative for adenopathy. Does not bruise/bleed easily.      Objective:    Physical Exam  Constitutional: She appears well-developed and well-nourished.  Appears tired/fatigued  Neck: Normal range of motion. Neck supple.  Cardiovascular: Normal rate and regular rhythm.  Pulmonary/Chest: Effort normal and breath sounds normal.  Abdominal: Soft. There is no abdominal tenderness. There is no rebound and no guarding.  Musculoskeletal:     Right wrist: She exhibits tenderness. She exhibits no swelling.     Left wrist: She exhibits tenderness.       Arms:  Lymphadenopathy:    She has no cervical adenopathy.  Nursing note and vitals reviewed.   BP 128/87 (BP Location: Right Arm, Patient Position: Sitting, Cuff Size: Large)   Pulse 86   Temp 98.7 F (37.1 C) (Oral)   Ht 5\' 11"  (1.803 m)   Wt 202 lb (91.6 kg)   SpO2 97%   BMI 28.17 kg/m  Wt Readings from Last 3 Encounters:  07/03/18 202 lb (91.6 kg)  04/03/18 220 lb (99.8 kg)  12/20/17 202 lb (91.6 kg)     Health Maintenance Due  Topic Date Due  . COLONOSCOPY  12/31/2011  . PAP SMEAR-Modifier  03/11/2012  . MAMMOGRAM  05/31/2013      Lab Results  Component Value Date   TSH 0.678 11/05/2013   Lab Results  Component Value Date   WBC 4.1 11/05/2013   HGB 13.1 11/05/2013   HCT 39.3 11/05/2013   MCV 88.5 11/05/2013   PLT 229 11/05/2013   Lab Results  Component Value Date   NA 139 12/20/2017   K 4.0 12/20/2017   CO2 20 12/20/2017   GLUCOSE 106 (H) 12/20/2017   BUN 12 12/20/2017   CREATININE 0.73 12/20/2017   BILITOT 0.5 12/20/2017   ALKPHOS 59 12/20/2017   AST 19 12/20/2017   ALT 17 12/20/2017   PROT 7.3 12/20/2017   ALBUMIN 4.6 12/20/2017   CALCIUM 9.9 12/20/2017   Lab Results  Component  Value Date   CHOL 210 (H) 12/20/2017   Lab Results  Component Value Date   HDL 80 12/20/2017   Lab Results  Component Value Date   LDLCALC 112 (H) 12/20/2017   Lab Results  Component Value Date   TRIG 91 12/20/2017   Lab Results  Component Value Date   CHOLHDL 2.6 12/20/2017   Lab Results  Component Value Date   HGBA1C 6.2 (H) 03/24/2015      Assessment & Plan:  1. Essential hypertension Patient's blood pressure is controlled on her current dyazide which she will continue.  2. Rheumatoid arthritis involving multiple sites with positive rheumatoid factor [facility]) Patient  reports continued multiple joint pain and has had prior rheumatology follow-up however she could not afford the speciality fee charged by her insurance so she was not able to attend the follow-up appointment after her initial visit. Will see if patient can now attend follow-up with rheumatology due to her complaints of continued joint pain and fatigue - Ambulatory referral to Rheumatology  3. Carpal tunnel syndrome, bilateral, 4. Tenosynovitis of both wrists Patient with bilateral carpel tunnel syndrome and tenosynovitis of her wrists. RX for prednisone taper for inflammation and Orthopedic referral - AMB referral to orthopedics - predniSONE (DELTASONE) 20 MG tablet; Take 2 pills daily x 2 days then 1 pill daily x 2 days  then 1/2 pill daily x 4 days  Dispense: 8 tablet; Refill: 0  An After Visit Summary was printed and given to the patient.  Allergies as of 07/03/2018   No Known Allergies     Medication List       Accurate as of July 03, 2018 11:59 PM. Always use your most recent med list.        atorvastatin 10 MG tablet Commonly known as:  LIPITOR Take 1 tablet (10 mg total) by mouth daily. To lower cholesterol   diclofenac 75 MG EC tablet Commonly known as:  VOLTAREN Take 1 tablet (75 mg total) by mouth 2 (two) times daily. As needed for pain. Eat prior to taking this medication     fluticasone 50 MCG/ACT nasal spray Commonly known as:  FLONASE Place 1 spray into both nostrils daily.   methotrexate 2.5 MG tablet TAKE THREE TABLETS AT ONCE BY MOUTH ONCE A WEEK. Caution" Chemotherapy. Protect from light.   methylPREDNISolone 4 MG Tbpk tablet Commonly known as:  MEDROL DOSEPAK Use as directed   omeprazole 20 MG capsule Commonly known as:  PRILOSEC Take 1 capsule (20 mg total) by mouth daily.   predniSONE 20 MG tablet Commonly known as:  DELTASONE Take 2 pills daily x 2 days then 1 pill daily x 2 days  then 1/2 pill daily x 4 days   triamterene-hydrochlorothiazide 37.5-25 MG capsule Commonly known as:  Dyazide Take 1 each (1 capsule total) by mouth daily.   Wrist Brace Misc APPLY BRACE TO BILATERAL WRIST FOR CARPAL TUNNEL.TO BE FITTED BY MEDICAL SUPPLY.       Follow-up: Return in about 4 months (around 11/02/2018) for HTN/lipids and labs.  Antony Blackbird, MD

## 2018-07-03 NOTE — Progress Notes (Signed)
Per pt both hand hurts and she thinks she's getting a knot on her fingers on both side.

## 2018-07-10 ENCOUNTER — Ambulatory Visit (INDEPENDENT_AMBULATORY_CARE_PROVIDER_SITE_OTHER): Payer: Self-pay | Admitting: Orthopaedic Surgery

## 2018-08-17 ENCOUNTER — Ambulatory Visit: Payer: BLUE CROSS/BLUE SHIELD | Admitting: Rheumatology

## 2018-08-31 ENCOUNTER — Other Ambulatory Visit: Payer: Self-pay

## 2018-08-31 ENCOUNTER — Emergency Department (HOSPITAL_COMMUNITY)
Admission: EM | Admit: 2018-08-31 | Discharge: 2018-08-31 | Disposition: A | Discharge status: Home / Self Care | Payer: BLUE CROSS/BLUE SHIELD | Attending: Emergency Medicine | Admitting: Emergency Medicine

## 2018-08-31 ENCOUNTER — Encounter (HOSPITAL_COMMUNITY): Payer: Self-pay | Admitting: Emergency Medicine

## 2018-08-31 DIAGNOSIS — Z79899 Other long term (current) drug therapy: Secondary | ICD-10-CM | POA: Insufficient documentation

## 2018-08-31 DIAGNOSIS — R519 Headache, unspecified: Secondary | ICD-10-CM

## 2018-08-31 DIAGNOSIS — I10 Essential (primary) hypertension: Secondary | ICD-10-CM | POA: Insufficient documentation

## 2018-08-31 DIAGNOSIS — R51 Headache: Secondary | ICD-10-CM | POA: Insufficient documentation

## 2018-08-31 MED ORDER — IBUPROFEN 600 MG PO TABS: 600.0000 mg | ORAL_TABLET | Freq: Four times a day (QID) | ORAL | 0 refills | Status: DC | PRN

## 2018-08-31 MED ORDER — SODIUM CHLORIDE 0.9 % IV BOLUS
1000.0000 mL | Freq: Once | INTRAVENOUS | Status: AC
  Administered 2018-08-31: 1000 mL via INTRAVENOUS

## 2018-08-31 MED ORDER — KETOROLAC TROMETHAMINE 30 MG/ML IJ SOLN
30.0000 mg | Freq: Once | INTRAMUSCULAR | Status: AC
  Administered 2018-08-31: 30 mg via INTRAVENOUS
  Filled 2018-08-31: qty 1

## 2018-08-31 MED ORDER — DIPHENHYDRAMINE HCL 50 MG/ML IJ SOLN
25.0000 mg | Freq: Once | INTRAMUSCULAR | Status: AC
  Administered 2018-08-31: 16:00:00 25 mg via INTRAVENOUS
  Filled 2018-08-31: qty 1

## 2018-08-31 MED ORDER — METOCLOPRAMIDE HCL 5 MG/ML IJ SOLN
10.0000 mg | Freq: Once | INTRAMUSCULAR | Status: AC
  Administered 2018-08-31: 16:00:00 10 mg via INTRAVENOUS
  Filled 2018-08-31: qty 2

## 2018-08-31 NOTE — ED Provider Notes (Signed)
[facility] EMERGENCY DEPARTMENT Provider Note   CSN: [id number] Arrival date & time: 08/31/18  1349     History   Chief Complaint Chief Complaint  Patient presents with  . Headache    HPI [name] is a 58 y.o. female.     The history is provided by the patient and medical records. No language interpreter was used.  Headache   58 year old female with history of hypertension, rheumatoid arthritis, hypercholesterolemia presenting for evaluation of headache.  Patient developed gradual onset of frontal headache ongoing for the past week.  Described headache as a tightness sensation and tension-like across the hand, which has been persistent, rates pain 6 out of 10.  She endorsed one episode of abdominal discomfort and some diarrhea several days ago but that since resolved.  She felt that headache is likely related to increasing stress due to the current event trouble coping 20 as well as stress from her job working the [employer].  She tries taking BC powder at home without adequate relief.  She does not normally have headaches.  She does not report of any fever or chills, no nausea or vomiting, no runny nose sneezing or coughing neck stiffness or rash.  No recent sick exposure.   Past Medical History:  Diagnosis Date  . Bilateral carpal tunnel syndrome   . Hypercholesteremia   . Hypertension   . OAB (overactive bladder)   . Rheumatoid arthritis [facility])     Patient Active Problem List   Diagnosis Date Noted  . Rheumatoid arthritis involving multiple sites with positive rheumatoid factor ([place]) 01/10/2017  . Rheumatoid factor positive 12/10/2016  . Chronic arthralgias of knees and hips 12/10/2016  . 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 Knee pain, chronic 04/02/2015  . DIVERTICULAR DISEASE 11/26/2009  . ALLERGIC RHINITIS 10/16/2009  . RECTAL PAIN 10/16/2009  . CONSTIPATION 03/11/2009  . ANEMIA, IRON DEFICIENCY 02/21/2009  . HYPERTENSION, BENIGN  ESSENTIAL 02/19/2009  . GERD 02/19/2009  . VAGINAL PRURITUS 02/19/2009  . FREQUENCY, URINARY 02/19/2009  . MENOPAUSE, SURGICAL 03/08/2004    Past Surgical History:  Procedure Laterality Date  . ABDOMINAL HYSTERECTOMY       OB History   No obstetric history on file.      Home Medications    Prior to Admission medications   Medication Sig Start Date End Date Taking? Authorizing Provider  atorvastatin (LIPITOR) 10 MG tablet Take 1 tablet (10 mg total) by mouth daily. To lower cholesterol 04/03/18   Margarita Mail, PA-C  diclofenac (VOLTAREN) 75 MG EC tablet Take 1 tablet (75 mg total) by mouth 2 (two) times daily. As needed for pain. Eat prior to taking this medication 12/20/17   Fulp, Cammie, MD  fluticasone (FLONASE) 50 MCG/ACT nasal spray Place 1 spray into both nostrils daily. Patient not taking: Reported on 07/03/2018 04/03/18   Margarita Mail, PA-C  methotrexate 2.5 MG tablet TAKE THREE TABLETS AT ONCE BY MOUTH ONCE A WEEK. Caution" Chemotherapy. Protect from light. Patient not taking: Reported on 11/29/2017 01/10/17   Alfonse Spruce, FNP  methylPREDNISolone (MEDROL DOSEPAK) 4 MG TBPK tablet Use as directed Patient not taking: Reported on 07/03/2018 04/03/18   Margarita Mail, PA-C  Misc. Devices (WRIST BRACE) MISC APPLY BRACE TO BILATERAL WRIST FOR CARPAL TUNNEL.TO BE FITTED BY MEDICAL SUPPLY. Patient not taking: Reported on 11/29/2017 01/10/17   Alfonse Spruce, FNP  omeprazole (PRILOSEC) 20 MG capsule Take 1 capsule (20 mg total) by mouth daily. Patient not taking: Reported  on 12/20/2017 03/18/15   Lance Bosch, NP  predniSONE (DELTASONE) 20 MG tablet Take 2 pills daily x 2 days then 1 pill daily x 2 days  then 1/2 pill daily x 4 days 07/03/18   Fulp, Cammie, MD  triamterene-hydrochlorothiazide (DYAZIDE) 37.5-25 MG capsule Take 1 each (1 capsule total) by mouth daily. 04/03/18   Margarita Mail, PA-C    Family History Family History  Problem Relation Age of Onset  .  Hypertension Mother   . Diabetes Mother   . Cancer Father     Social History Social History   Tobacco Use  . Smoking status: Never Smoker  . Smokeless tobacco: Never Used  Substance Use Topics  . Alcohol use: Yes    Comment: occasonl  . Drug use: Not Currently    Frequency: 2.0 times per week    Types: Marijuana     Allergies   Patient has no known allergies.   Review of Systems Review of Systems  Neurological: Positive for headaches.  All other systems reviewed and are negative.    Physical Exam Updated Vital Signs BP (!) 152/89 (BP Location: Right Arm)   Pulse 68   Temp 98.7 F (37.1 C) (Oral)   Resp 18   Ht 5\' 11"  (1.803 m)   Wt 102.1 kg   LMP  (Exact Date)   SpO2 96%   BMI 31.38 kg/m   Physical Exam Vitals signs and nursing note reviewed.  Constitutional:      General: She is not in acute distress.    Appearance: She is well-developed.  HENT:     Head: Normocephalic and atraumatic.  Eyes:     Extraocular Movements: Extraocular movements intact.     Conjunctiva/sclera: Conjunctivae normal.     Pupils: Pupils are equal, round, and reactive to light.  Neck:     Musculoskeletal: Normal range of motion and neck supple. No neck rigidity.  Cardiovascular:     Rate and Rhythm: Normal rate and regular rhythm.  Pulmonary:     Effort: Pulmonary effort is normal.     Breath sounds: Normal breath sounds.  Abdominal:     General: Bowel sounds are normal.     Palpations: Abdomen is soft.     Tenderness: There is no abdominal tenderness.  Musculoskeletal: Normal range of motion.  Skin:    Findings: No rash.  Neurological:     Mental Status: She is alert.     GCS: GCS eye subscore is 4. GCS verbal subscore is 5. GCS motor subscore is 6.     Cranial Nerves: No cranial nerve deficit.     Sensory: No sensory deficit.     Motor: No weakness.     Coordination: Romberg sign negative.     Gait: Gait normal.     Deep Tendon Reflexes: Reflexes normal.   Psychiatric:        Mood and Affect: Mood normal.      ED Treatments / Results  Labs (all labs ordered are listed, but only abnormal results are displayed) Labs Reviewed - No data to display  EKG    Radiology No results found.  Procedures Procedures (including critical care time)  Medications Ordered in ED Medications  ketorolac (TORADOL) 30 MG/ML injection 30 mg (30 mg Intravenous Given 08/31/18 1546)  diphenhydrAMINE (BENADRYL) injection 25 mg (25 mg Intravenous Given 08/31/18 1546)  metoCLOPramide (REGLAN) injection 10 mg (10 mg Intravenous Given 08/31/18 1546)  sodium chloride 0.9 % bolus 1,000 mL (1,000 mLs  Intravenous New Bag/Given 08/31/18 1546)     Initial Impression / Assessment and Plan / ED Course  I have reviewed the triage vital signs and the nursing notes.  Pertinent labs & imaging results that were available during my care of the patient were reviewed by me and considered in my medical decision making (see chart for details).        BP (!) 152/89 (BP Location: Right Arm)   Pulse 62   Temp 98.7 F (37.1 C) (Oral)   Resp 18   Ht 5\' 11"  (1.803 m)   Wt 102.1 kg   LMP  (Exact Date)   SpO2 97%   BMI 31.38 kg/m    Final Clinical Impressions(s) / ED Diagnoses   Final diagnoses:  Bad headache    ED Discharge Orders         Ordered    ibuprofen (ADVIL) 600 MG tablet  Every 6 hours PRN     08/31/18 1901         Headache without concerning feature. no fever, neck stiffness, neuro findings or new symptoms to suggest more serious etiology.  I don't think SAH, ICH, meningitis, encephalitis, mass at this time.  No recent trauma.  I don't feel imaging necessary at this time.  Plan to control symptoms.  7:00 PM Pt report improvement of headache.  Requesting for work note.  Will provide work note.  Return precaution given.    Domenic Moras, PA-C 08/31/18 1904    Hayden Rasmussen, MD 09/01/18 [phone]

## 2018-08-31 NOTE — ED Notes (Signed)
Patient verbalizes understanding of discharge instructions. Opportunity for questioning and answers were provided. Armband removed by staff, pt discharged from ED ambulatory to home.  

## 2018-08-31 NOTE — ED Triage Notes (Signed)
Pt reports a headache for a week.

## 2018-09-13 ENCOUNTER — Encounter (HOSPITAL_COMMUNITY): Payer: Self-pay | Admitting: Emergency Medicine

## 2018-09-13 ENCOUNTER — Emergency Department (HOSPITAL_COMMUNITY)
Admission: EM | Admit: 2018-09-13 | Discharge: 2018-09-13 | Disposition: A | Discharge status: Home / Self Care | Payer: BC Managed Care – PPO | Attending: Emergency Medicine | Admitting: Emergency Medicine

## 2018-09-13 ENCOUNTER — Emergency Department (HOSPITAL_COMMUNITY): Payer: BC Managed Care – PPO

## 2018-09-13 ENCOUNTER — Other Ambulatory Visit: Payer: Self-pay

## 2018-09-13 DIAGNOSIS — R05 Cough: Secondary | ICD-10-CM | POA: Diagnosis present

## 2018-09-13 DIAGNOSIS — U071 COVID-19: Secondary | ICD-10-CM | POA: Insufficient documentation

## 2018-09-13 DIAGNOSIS — I10 Essential (primary) hypertension: Secondary | ICD-10-CM | POA: Insufficient documentation

## 2018-09-13 DIAGNOSIS — R6883 Chills (without fever): Secondary | ICD-10-CM

## 2018-09-13 DIAGNOSIS — Z79899 Other long term (current) drug therapy: Secondary | ICD-10-CM | POA: Diagnosis not present

## 2018-09-13 DIAGNOSIS — R5383 Other fatigue: Secondary | ICD-10-CM

## 2018-09-13 DIAGNOSIS — R059 Cough, unspecified: Secondary | ICD-10-CM

## 2018-09-13 DIAGNOSIS — R519 Headache, unspecified: Secondary | ICD-10-CM

## 2018-09-13 LAB — CBC WITH DIFFERENTIAL/PLATELET
Abs Immature Granulocytes: 0.02 10*3/uL (ref 0.00–0.07)
Basophils Absolute: 0 10*3/uL (ref 0.0–0.1)
Basophils Relative: 0 %
Eosinophils Absolute: 0 10*3/uL (ref 0.0–0.5)
Eosinophils Relative: 0 %
HCT: 40.3 % (ref 36.0–46.0)
Hemoglobin: 13.9 g/dL (ref 12.0–15.0)
Immature Granulocytes: 1 %
Lymphocytes Relative: 19 %
Lymphs Abs: 0.6 10*3/uL — ABNORMAL LOW (ref 0.7–4.0)
MCH: 30.2 pg (ref 26.0–34.0)
MCHC: 34.5 g/dL (ref 30.0–36.0)
MCV: 87.6 fL (ref 80.0–100.0)
Monocytes Absolute: 0.5 10*3/uL (ref 0.1–1.0)
Monocytes Relative: 16 %
Neutro Abs: 2.2 10*3/uL (ref 1.7–7.7)
Neutrophils Relative %: 64 %
Platelets: 177 10*3/uL (ref 150–400)
RBC: 4.6 MIL/uL (ref 3.87–5.11)
RDW: 13.2 % (ref 11.5–15.5)
WBC: 3.4 10*3/uL — ABNORMAL LOW (ref 4.0–10.5)
nRBC: 0 % (ref 0.0–0.2)

## 2018-09-13 LAB — COMPREHENSIVE METABOLIC PANEL
ALT: 26 U/L (ref 0–44)
AST: 41 U/L (ref 15–41)
Albumin: 4.7 g/dL (ref 3.5–5.0)
Alkaline Phosphatase: 48 U/L (ref 38–126)
Anion gap: 14 (ref 5–15)
BUN: 14 mg/dL (ref 6–20)
CO2: 19 mmol/L — ABNORMAL LOW (ref 22–32)
Calcium: 9.7 mg/dL (ref 8.9–10.3)
Chloride: 103 mmol/L (ref 98–111)
Creatinine, Ser: 0.87 mg/dL (ref 0.44–1.00)
GFR calc Af Amer: >60 mL/min (ref >60)
GFR calc non Af Amer: >60 mL/min (ref >60)
Glucose, Bld: 148 mg/dL — ABNORMAL HIGH (ref 70–99)
Potassium: 4.5 mmol/L (ref 3.5–5.1)
Sodium: 136 mmol/L (ref 135–145)
Total Bilirubin: 1.2 mg/dL (ref 0.3–1.2)
Total Protein: 8.1 g/dL (ref 6.5–8.1)

## 2018-09-13 MED ORDER — DIPHENHYDRAMINE HCL 50 MG/ML IJ SOLN
25.0000 mg | Freq: Once | INTRAMUSCULAR | Status: AC
  Administered 2018-09-13: 25 mg via INTRAVENOUS
  Filled 2018-09-13: qty 1

## 2018-09-13 MED ORDER — SODIUM CHLORIDE 0.9 % IV BOLUS
1000.0000 mL | Freq: Once | INTRAVENOUS | Status: AC
  Administered 2018-09-13: 13:00:00 1000 mL via INTRAVENOUS

## 2018-09-13 MED ORDER — PROCHLORPERAZINE EDISYLATE 10 MG/2ML IJ SOLN
10.0000 mg | Freq: Once | INTRAMUSCULAR | Status: AC
  Administered 2018-09-13: 10 mg via INTRAVENOUS
  Filled 2018-09-13: qty 2

## 2018-09-13 NOTE — ED Triage Notes (Signed)
Pt here from work with c/o chills and h/a  With productive cough

## 2018-09-13 NOTE — ED Provider Notes (Signed)
[facility] EMERGENCY DEPARTMENT Provider Note   CSN: [id number] Arrival date & time: 09/13/18  1214    History   Chief Complaint No chief complaint on file.   HPI [name] is a 58 y.o. female.     58yo F w/ PMH including HTN, RA, HLD who p/w cough, fatigue, chills, headache. Pt states that yesterday she began having chills associated with fatigue, decreased appetite, productive cough, mild shortness of breath, and diarrhea. She had 1 episode of vomiting. She's had some headache pain behind her eyes. No measured fevers, urinary symptoms, recent travel, leg swelling, or sick contacts. She's been taking OTC cold medication. No chest pain.  The history is provided by the patient.    Past Medical History:  Diagnosis Date  . Bilateral carpal tunnel syndrome   . Hypercholesteremia   . Hypertension   . OAB (overactive bladder)   . Rheumatoid arthritis [facility])     Patient Active Problem List   Diagnosis Date Noted  . Rheumatoid arthritis involving multiple sites with positive rheumatoid factor ([place]) 01/10/2017  . Rheumatoid factor positive 12/10/2016  . Chronic arthralgias of knees and hips 12/10/2016  . 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 Knee pain, chronic 04/02/2015  . DIVERTICULAR DISEASE 11/26/2009  . ALLERGIC RHINITIS 10/16/2009  . RECTAL PAIN 10/16/2009  . CONSTIPATION 03/11/2009  . ANEMIA, IRON DEFICIENCY 02/21/2009  . HYPERTENSION, BENIGN ESSENTIAL 02/19/2009  . GERD 02/19/2009  . VAGINAL PRURITUS 02/19/2009  . FREQUENCY, URINARY 02/19/2009  . MENOPAUSE, SURGICAL 03/08/2004    Past Surgical History:  Procedure Laterality Date  . ABDOMINAL HYSTERECTOMY       OB History   No obstetric history on file.      Home Medications    Prior to Admission medications   Medication Sig Start Date End Date Taking? Authorizing Provider  triamterene-hydrochlorothiazide (DYAZIDE) 37.5-25 MG capsule Take 1 each (1 capsule total) by mouth daily.  04/03/18  Yes Harris, Abigail, PA-C  atorvastatin (LIPITOR) 10 MG tablet Take 1 tablet (10 mg total) by mouth daily. To lower cholesterol Patient not taking: Reported on 09/13/2018 04/03/18   Margarita Mail, PA-C  ibuprofen (ADVIL) 600 MG tablet Take 1 tablet (600 mg total) by mouth every 6 (six) hours as needed. Patient not taking: Reported on 09/13/2018 08/31/18   Domenic Moras, PA-C    Family History Family History  Problem Relation Age of Onset  . Hypertension Mother   . Diabetes Mother   . Cancer Father     Social History Social History   Tobacco Use  . Smoking status: Never Smoker  . Smokeless tobacco: Never Used  Substance Use Topics  . Alcohol use: Yes    Comment: occasonl  . Drug use: Not Currently    Frequency: 2.0 times per week    Types: Marijuana     Allergies   Patient has no known allergies.   Review of Systems Review of Systems All other systems reviewed and are negative except that which was mentioned in HPI   Physical Exam Updated Vital Signs BP 140/82 (BP Location: Right Arm)   Pulse 71   Temp 98.8 F (37.1 C) (Oral)   Resp 20   SpO2 97%   Physical Exam Vitals signs and nursing note reviewed.  Constitutional:      General: She is not in acute distress.    Appearance: She is well-developed.  HENT:     Head: Normocephalic and atraumatic.  Eyes:     Conjunctiva/sclera: Conjunctivae  normal.  Neck:     Musculoskeletal: Neck supple.  Cardiovascular:     Rate and Rhythm: Normal rate and regular rhythm.     Pulses: Normal pulses.  Pulmonary:     Effort: Pulmonary effort is normal.  Abdominal:     General: Bowel sounds are normal. There is no distension.     Palpations: Abdomen is soft.     Tenderness: There is no abdominal tenderness.  Musculoskeletal:     Right lower leg: No edema.     Left lower leg: No edema.  Lymphadenopathy:     Cervical: Cervical adenopathy present.  Skin:    General: Skin is warm and dry.  Neurological:     Mental  Status: She is alert and oriented to person, place, and time.     Comments: Fluent speech  Psychiatric:        Judgment: Judgment normal.      ED Treatments / Results  Labs (all labs ordered are listed, but only abnormal results are displayed) Labs Reviewed  COMPREHENSIVE METABOLIC PANEL - Abnormal; Notable for the following components:      Result Value   CO2 19 (*)    Glucose, Bld 148 (*)    All other components within normal limits  CBC WITH DIFFERENTIAL/PLATELET - Abnormal; Notable for the following components:   WBC 3.4 (*)    Lymphs Abs 0.6 (*)    All other components within normal limits  NOVEL CORONAVIRUS, NAA (HOSPITAL ORDER, SEND-OUT TO REF LAB)    EKG None  Radiology Dg Chest Port 1 View  Result Date: 09/13/2018 CLINICAL DATA:  Headaches, chills and productive cough EXAM: PORTABLE CHEST 1 VIEW COMPARISON:  November 25, 2016 FINDINGS: The heart size and mediastinal contours are within normal limits. Both lungs are clear. The visualized skeletal structures are unremarkable. IMPRESSION: No active cardiopulmonary disease. Electronically Signed   By: Abelardo Diesel M.D.   On: 09/13/2018 13:33    Procedures Procedures (including critical care time)  Medications Ordered in ED Medications  diphenhydrAMINE (BENADRYL) injection 25 mg (25 mg Intravenous Given 09/13/18 1326)  prochlorperazine (COMPAZINE) injection 10 mg (10 mg Intravenous Given 09/13/18 1327)  sodium chloride 0.9 % bolus 1,000 mL (1,000 mLs Intravenous New Bag/Given 09/13/18 1326)     Initial Impression / Assessment and Plan / ED Course  I have reviewed the triage vital signs and the nursing notes.  Pertinent labs & imaging results that were available during my care of the patient were reviewed by me and considered in my medical decision making (see chart for details).        Comfortable on exam, normal work of breathing, normal vital signs.  Normal O2 saturation on room air.  Symptoms are suggestive of  viral process, DDx includes COVID-19.  Her symptoms do not sound like PE, ACS, or other acute cardiac process.  Lab work shows reassuring CMP, CBC with WBC 3.4 which further suggest viral process.  I recommended send out COVID-19 testing and I explained that results may take up to 5 to 7 days.  I have discussed supportive measures and PCP follow-up.  Extensively reviewed return precautions especially in regards to any breathing issues.  Patient voiced understanding.  Final Clinical Impressions(s) / ED Diagnoses   Final diagnoses:  Chills  Acute nonintractable headache, unspecified headache type  Fatigue, unspecified type  Cough    ED Discharge Orders    None       Pauletta Pickney, Wenda Overland, MD 09/13/18 1511

## 2018-09-15 LAB — NOVEL CORONAVIRUS, NAA (HOSP ORDER, SEND-OUT TO REF LAB; TAT 18-24 HRS): SARS-CoV-2, NAA: DETECTED — AB

## 2018-09-20 ENCOUNTER — Ambulatory Visit: Payer: BLUE CROSS/BLUE SHIELD | Admitting: Rheumatology

## 2018-11-02 ENCOUNTER — Ambulatory Visit: Payer: BLUE CROSS/BLUE SHIELD | Admitting: Family Medicine

## 2018-11-30 ENCOUNTER — Other Ambulatory Visit: Payer: Self-pay | Admitting: Family Medicine

## 2018-11-30 DIAGNOSIS — I10 Essential (primary) hypertension: Secondary | ICD-10-CM

## 2018-11-30 MED FILL — TRIAMTERENE/HCTZ 37.5/25 TB: 37.5-25 | 30 days supply | Qty: 30 | Fill #0

## 2018-12-23 ENCOUNTER — Other Ambulatory Visit: Payer: Self-pay

## 2018-12-23 ENCOUNTER — Encounter (HOSPITAL_COMMUNITY): Payer: Self-pay | Admitting: Emergency Medicine

## 2018-12-23 ENCOUNTER — Emergency Department (HOSPITAL_COMMUNITY)
Admission: EM | Admit: 2018-12-23 | Discharge: 2018-12-23 | Disposition: A | Discharge status: Home / Self Care | Payer: Self-pay | Attending: Emergency Medicine | Admitting: Emergency Medicine

## 2018-12-23 DIAGNOSIS — Z79899 Other long term (current) drug therapy: Secondary | ICD-10-CM | POA: Insufficient documentation

## 2018-12-23 DIAGNOSIS — D171 Benign lipomatous neoplasm of skin and subcutaneous tissue of trunk: Secondary | ICD-10-CM | POA: Insufficient documentation

## 2018-12-23 DIAGNOSIS — I10 Essential (primary) hypertension: Secondary | ICD-10-CM | POA: Insufficient documentation

## 2018-12-23 NOTE — ED Provider Notes (Signed)
[place] EMERGENCY DEPARTMENT Provider Note   CSN: [id number] Arrival date & time: 12/23/18  [id number]     History   Chief Complaint No chief complaint on file.   HPI [name] is a 58 y.o. female.     58 year old female presents with complaint of a lump on her left mid axillary lower rib area which she first noticed 2 weeks ago.  Patient's been monitoring the area, it has remained unchanged, it is nontender, no overlying skin changes.  Patient wanted to be seen today to figure out why she has this lump on the side of her body.  Does not note any other lumps.  No other complaints or concerns.     Past Medical History:  Diagnosis Date  . Bilateral carpal tunnel syndrome   . Hypercholesteremia   . Hypertension   . OAB (overactive bladder)   . Rheumatoid arthritis [facility])     Patient Active Problem List   Diagnosis Date Noted  . Rheumatoid arthritis involving multiple sites with positive rheumatoid factor ([place]) 01/10/2017  . Rheumatoid factor positive 12/10/2016  . Chronic arthralgias of knees and hips 12/10/2016  . 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 Knee pain, chronic 04/02/2015  . DIVERTICULAR DISEASE 11/26/2009  . ALLERGIC RHINITIS 10/16/2009  . RECTAL PAIN 10/16/2009  . CONSTIPATION 03/11/2009  . ANEMIA, IRON DEFICIENCY 02/21/2009  . HYPERTENSION, BENIGN ESSENTIAL 02/19/2009  . GERD 02/19/2009  . VAGINAL PRURITUS 02/19/2009  . FREQUENCY, URINARY 02/19/2009  . MENOPAUSE, SURGICAL 03/08/2004    Past Surgical History:  Procedure Laterality Date  . ABDOMINAL HYSTERECTOMY       OB History   No obstetric history on file.      Home Medications    Prior to Admission medications   Medication Sig Start Date End Date Taking? Authorizing Provider  atorvastatin (LIPITOR) 10 MG tablet Take 1 tablet (10 mg total) by mouth daily. To lower cholesterol Patient not taking: Reported on 09/13/2018 04/03/18   Margarita Mail, PA-C  ibuprofen (ADVIL)  600 MG tablet Take 1 tablet (600 mg total) by mouth every 6 (six) hours as needed. Patient not taking: Reported on 09/13/2018 08/31/18   Domenic Moras, PA-C  triamterene-hydrochlorothiazide (MAXZIDE-25) 37.5-25 MG tablet Take 1 tablet by mouth daily. Must keep and make office visit for refills 11/30/18   Antony Blackbird, MD    Family History Family History  Problem Relation Age of Onset  . Hypertension Mother   . Diabetes Mother   . Cancer Father     Social History Social History   Tobacco Use  . Smoking status: Never Smoker  . Smokeless tobacco: Never Used  Substance Use Topics  . Alcohol use: Yes    Comment: occasonl  . Drug use: Not Currently    Frequency: 2.0 times per week    Types: Marijuana     Allergies   Patient has no known allergies.   Review of Systems Review of Systems  Constitutional: Negative for fever.  Respiratory: Negative for shortness of breath.   Cardiovascular: Negative for chest pain.  Musculoskeletal: Negative for arthralgias and myalgias.  Skin: Negative for color change, rash and wound.  Psychiatric/Behavioral: Negative for confusion.  All other systems reviewed and are negative.    Physical Exam Updated Vital Signs BP (!) 146/73 (BP Location: Right Arm)   Pulse 71   Temp 98.4 F (36.9 C) (Oral)   Resp 18   Ht 5\' 11"  (1.803 m)   Wt 101.6 kg  SpO2 100%   BMI 31.24 kg/m   Physical Exam Vitals signs and nursing note reviewed.  Constitutional:      General: She is not in acute distress.    Appearance: She is well-developed. She is not diaphoretic.  HENT:     Head: Normocephalic and atraumatic.  Pulmonary:     Effort: Pulmonary effort is normal.  Chest:     Chest wall: No tenderness.  Skin:    General: Skin is warm and dry.     Findings: No erythema or rash.       Neurological:     Mental Status: She is alert and oriented to person, place, and time.  Psychiatric:        Behavior: Behavior normal.      ED Treatments /  Results  Labs (all labs ordered are listed, but only abnormal results are displayed) Labs Reviewed - No data to display  EKG None  Radiology No results found.  Procedures Procedures (including critical care time)  Medications Ordered in ED Medications - No data to display   Initial Impression / Assessment and Plan / ED Course  I have reviewed the triage vital signs and the nursing notes.  Pertinent labs & imaging results that were available during my care of the patient were reviewed by me and considered in my medical decision making (see chart for details).  Clinical Course as of Dec 22 816  Sat Dec 23, 6686  [id number] 58 year old female with soft tissue mass to left flank.  On exam appears consistent with lipoma.  Discussing with patient, offered referral to general surgery for follow-up if patient desires removal.   [LM]    Clinical Course User Index [LM] Tacy Learn, PA-C      Final Clinical Impressions(s) / ED Diagnoses   Final diagnoses:  Lipoma of torso    ED Discharge Orders    None       Tacy Learn, PA-C 12/23/18 0818    Isla Pence, MD 12/23/18 [id number]

## 2018-12-23 NOTE — ED Triage Notes (Signed)
Pt has a small non tender knot to her left flank area that she noticed about 2 weeks ago , doesn't hurt just wants it checked out

## 2019-01-17 MED FILL — AMOXICILLIN 500 MG CAPSULE: 500 | 7 days supply | Qty: 21 | Fill #0

## 2019-01-17 MED FILL — IBUPROFEN 600 MG TABLET: 600 | 7 days supply | Qty: 28 | Fill #0

## 2019-01-24 MED FILL — HYDROCODON-APAP 5-325: 5-325 | 1 days supply | Qty: 4 | Fill #0

## 2019-04-12 ENCOUNTER — Ambulatory Visit: Payer: Self-pay

## 2019-04-13 ENCOUNTER — Encounter: Payer: Self-pay | Admitting: Family Medicine

## 2019-04-13 ENCOUNTER — Other Ambulatory Visit: Payer: Self-pay

## 2019-04-13 ENCOUNTER — Other Ambulatory Visit: Payer: Self-pay | Admitting: Family Medicine

## 2019-04-13 ENCOUNTER — Ambulatory Visit: Payer: Self-pay | Attending: Family Medicine | Admitting: Family Medicine

## 2019-04-13 VITALS — BP 148/83 | HR 60 | Ht 71.0 in | Wt 216.0 lb

## 2019-04-13 DIAGNOSIS — M0579 Rheumatoid arthritis with rheumatoid factor of multiple sites without organ or systems involvement: Secondary | ICD-10-CM

## 2019-04-13 DIAGNOSIS — E785 Hyperlipidemia, unspecified: Secondary | ICD-10-CM

## 2019-04-13 DIAGNOSIS — Z1211 Encounter for screening for malignant neoplasm of colon: Secondary | ICD-10-CM

## 2019-04-13 DIAGNOSIS — Z79899 Other long term (current) drug therapy: Secondary | ICD-10-CM

## 2019-04-13 DIAGNOSIS — Z1231 Encounter for screening mammogram for malignant neoplasm of breast: Secondary | ICD-10-CM

## 2019-04-13 DIAGNOSIS — R739 Hyperglycemia, unspecified: Secondary | ICD-10-CM

## 2019-04-13 DIAGNOSIS — I10 Essential (primary) hypertension: Secondary | ICD-10-CM

## 2019-04-13 DIAGNOSIS — R7303 Prediabetes: Secondary | ICD-10-CM

## 2019-04-13 LAB — POCT GLYCOSYLATED HEMOGLOBIN (HGB A1C): HbA1c, POC (controlled diabetic range): 6.3 % (ref 0.0–7.0)

## 2019-04-13 MED ORDER — DICLOFENAC SODIUM 75 MG PO TBEC: 75.0000 mg | DELAYED_RELEASE_TABLET | Freq: Two times a day (BID) | ORAL | 5 refills | Status: DC

## 2019-04-13 MED ORDER — PREDNISONE 20 MG PO TABS: ORAL_TABLET | ORAL | 0 refills | Status: DC

## 2019-04-13 MED ORDER — METFORMIN HCL 500 MG PO TABS: 500.0000 mg | ORAL_TABLET | Freq: Two times a day (BID) | ORAL | 3 refills | Status: DC

## 2019-04-13 MED ORDER — TRIAMTERENE-HCTZ 37.5-25 MG PO TABS: 1.0000 | ORAL_TABLET | Freq: Every day | ORAL | 1 refills | Status: DC

## 2019-04-13 MED ORDER — ATORVASTATIN CALCIUM 10 MG PO TABS: 10.0000 mg | ORAL_TABLET | Freq: Every day | ORAL | 1 refills | Status: DC

## 2019-04-13 MED FILL — TRIAMTERENE-HCTZ 37.5-25 MG: 37.5-25 | 30 days supply | Qty: 30 | Fill #0

## 2019-04-13 MED FILL — DICLOFENAC SOD EC 75 MG TAB: 75 | 30 days supply | Qty: 60 | Fill #0

## 2019-04-13 MED FILL — predniSONE 20 MG TABS: 20 | 5 days supply | Qty: 5 | Fill #0

## 2019-04-13 MED FILL — metFORMIN HCL 500 MG TABS: 500 | 30 days supply | Qty: 60 | Fill #0

## 2019-04-13 MED FILL — ATORVASTATIN 10 MG TABLET: 10 | 30 days supply | Qty: 30 | Fill #0

## 2019-04-13 NOTE — Progress Notes (Signed)
Established Patient Office Visit  Subjective:  Patient ID: [name], female    DOB: 12/26/1960  Age: 59 y.o. MRN: [id number]  CC: No chief complaint on file.   HPI [name], 59 year old female, last seen in the office on 07/03/2018 who presents for follow-up of chronic medical issues.  She has medical issues including hypertension, hypercholesterolemia, overactive bladder and rheumatoid arthritis in addition to bilateral carpal tunnel syndrome.  She is also status post COVID-19 infection in July 2020.  Patient had blood work done at that time and did have elevated blood sugar of 148 on a comprehensive metabolic panel from review of chart.        Patient reports that she has continued issues with pain in her hands especially in her thumbs. She would like a refill of diclofenec to take as needed for pain. She was unable to follow-up with Rheumatology after losing her insurance.  She currently works in a [employer] and has to do a lot of repetitive motion as well as lifting and moving objects which causes increased hand pain.  Current hand pain ranges from a 6 on a 0-to-10 scale to an 8 on a 0-to-10 scale.  She also has issues with fatigue.  She currently takes over-the-counter medication as needed for hand pain, usually ibuprofen.  She would like to have a refill of diclofenac which she took in the past as it did help decrease her hand pain.  She denies any blood in the stool and no black stools.  She denies any abdominal pain, nausea or acid reflux symptoms with current use of ibuprofen.         She also needs refill of triamterene hydrochlorothiazide which she has been taking for blood pressure.  She denies headaches or dizziness related to her blood pressure.  She denies any issues with chest pain or palpitations.  No peripheral edema.  She also needs refill of Lipitor.  She denies any increased muscle aches with the use of Lipitor.  She also continues to have numbness and  tingling in her hands at night, right greater than left due to carpal tunnel syndrome.  Past Medical History:  Diagnosis Date  . Bilateral carpal tunnel syndrome   . Hypercholesteremia   . Hypertension   . OAB (overactive bladder)   . Rheumatoid arthritis [facility])     Past Surgical History:  Procedure Laterality Date  . ABDOMINAL HYSTERECTOMY      Family History  Problem Relation Age of Onset  . Hypertension Mother   . Diabetes Mother   . Cancer Father     Social History   Socioeconomic History  . Marital status: Single    Spouse name: Not on file  . Number of children: Not on file  . Years of education: Not on file  . Highest education level: Not on file  Occupational History  . Not on file  Tobacco Use  . Smoking status: Never Smoker  . Smokeless tobacco: Never Used  Substance and Sexual Activity  . Alcohol use: Yes    Comment: occasonl  . Drug use: Not Currently    Frequency: 2.0 times per week    Types: Marijuana  . Sexual activity: Yes    Birth control/protection: Surgical  Other Topics Concern  . Not on file  Social History Narrative  . Not on file   Social Determinants of Health   Financial Resource Strain:   . Difficulty of Paying Living Expenses: Not on  file  Food Insecurity:   . Worried About Charity fundraiser in the Last Year: Not on file  . Ran Out of Food in the Last Year: Not on file  Transportation Needs:   . Lack of Transportation (Medical): Not on file  . Lack of Transportation (Non-Medical): Not on file  Physical Activity:   . Days of Exercise per Week: Not on file  . Minutes of Exercise per Session: Not on file  Stress:   . Feeling of Stress : Not on file  Social Connections:   . Frequency of Communication with Friends and Family: Not on file  . Frequency of Social Gatherings with Friends and Family: Not on file  . Attends Religious Services: Not on file  . Active Member of Clubs or Organizations: Not on file  . Attends Theatre manager Meetings: Not on file  . Marital Status: Not on file  Intimate Partner Violence:   . Fear of Current or Ex-Partner: Not on file  . Emotionally Abused: Not on file  . Physically Abused: Not on file  . Sexually Abused: Not on file    Outpatient Medications Prior to Visit  Medication Sig Dispense Refill  . atorvastatin (LIPITOR) 10 MG tablet Take 1 tablet (10 mg total) by mouth daily. To lower cholesterol (Patient not taking: Reported on 09/13/2018) 30 tablet 0  . ibuprofen (ADVIL) 600 MG tablet Take 1 tablet (600 mg total) by mouth every 6 (six) hours as needed. (Patient not taking: Reported on 09/13/2018) 30 tablet 0  . triamterene-hydrochlorothiazide (MAXZIDE-25) 37.5-25 MG tablet Take 1 tablet by mouth daily. Must keep and make office visit for refills 30 tablet 0   No facility-administered medications prior to visit.    No Known Allergies  ROS Review of Systems  Constitutional: Positive for fatigue. Negative for chills and fever.  HENT: Negative for sore throat and trouble swallowing.   Eyes: Negative for photophobia and visual disturbance.  Respiratory: Negative for cough and shortness of breath.   Cardiovascular: Negative for chest pain, palpitations and leg swelling.  Gastrointestinal: Negative for abdominal pain, blood in stool, constipation and diarrhea.  Endocrine: Negative for polydipsia, polyphagia and polyuria.  Genitourinary: Negative for dysuria and frequency.  Musculoskeletal: Positive for arthralgias and joint swelling.  Skin:       Complaint of feeling a mass in the skin on left lateral back  Neurological: Positive for numbness (hands especailly at night). Negative for dizziness and headaches.  Hematological: Negative for adenopathy. Does not bruise/bleed easily.      Objective:    Physical Exam  Constitutional: She is oriented to person, place, and time. She appears well-developed and well-nourished.  Neck: No JVD present.  Cardiovascular: Normal  rate and regular rhythm.  No carotid bruit  Pulmonary/Chest: Effort normal and breath sounds normal.  Abdominal: Soft. There is no abdominal tenderness. There is no rebound and no guarding.  Musculoskeletal:        General: Tenderness and edema present.     Cervical back: Normal range of motion and neck supple.     Comments: Edema of the thenar eminences bilaterally and at the right wrist/base of thumb; pain with palpation at the bases of the thumbs. Positive Tinel at the wrists, right greater than left  Lymphadenopathy:    She has no cervical adenopathy.  Neurological: She is alert and oriented to person, place, and time.  Skin: Skin is warm and dry.  Palpable mass along left lateral back near lower  ribcage which feels consistent with lipoma- size of half of a baseball  Psychiatric: She has a normal mood and affect. Her behavior is normal.  Affect is slightly flattened which could be from fatigue or depressed mood but patient otherwise interacts appropriately  Nursing note and vitals reviewed.   BP (!) 148/83   Pulse 60   Ht 5\' 11"  (1.803 m)   Wt 216 lb (98 kg)   SpO2 99%   BMI 30.13 kg/m   Wt Readings from Last 3 Encounters:  12/23/18 224 lb (101.6 kg)  08/31/18 225 lb (102.1 kg)  07/03/18 202 lb (91.6 kg)     Health Maintenance Due  Topic Date Due  . COLONOSCOPY  12/31/2011  . PAP SMEAR-Modifier  03/11/2012  . MAMMOGRAM  05/31/2013  . INFLUENZA VACCINE  10/07/2018    Lab Results  Component Value Date   TSH 0.678 11/05/2013   Lab Results  Component Value Date   WBC 3.4 (L) 09/13/2018   HGB 13.9 09/13/2018   HCT 40.3 09/13/2018   MCV 87.6 09/13/2018   PLT 177 09/13/2018   Lab Results  Component Value Date   NA 136 09/13/2018   K 4.5 09/13/2018   CO2 19 (L) 09/13/2018   GLUCOSE 148 (H) 09/13/2018   BUN 14 09/13/2018   CREATININE 0.87 09/13/2018   BILITOT 1.2 09/13/2018   ALKPHOS 48 09/13/2018   AST 41 09/13/2018   ALT 26 09/13/2018   PROT 8.1  09/13/2018   ALBUMIN 4.7 09/13/2018   CALCIUM 9.7 09/13/2018   ANIONGAP 14 09/13/2018   Lab Results  Component Value Date   CHOL 210 (H) 12/20/2017   Lab Results  Component Value Date   HDL 80 12/20/2017   Lab Results  Component Value Date   LDLCALC 112 (H) 12/20/2017   Lab Results  Component Value Date   TRIG 91 12/20/2017   Lab Results  Component Value Date   CHOLHDL 2.6 12/20/2017   Lab Results  Component Value Date   HGBA1C 6.2 (H) 03/24/2015      Assessment & Plan:  1. Essential hypertension She will continue the use of triamterene hydrochlorothiazide for treatment of hypertension.  Low-sodium diet also encouraged.  She will have comprehensive metabolic panel and lipid panel at today's visit. - Comprehensive metabolic panel - Lipid panel - triamterene-hydrochlorothiazide (MAXZIDE-25) 37.5-25 MG tablet; Take 1 tablet by mouth daily.  Dispense: 90 tablet; Refill: 1  2. Rheumatoid arthritis involving multiple sites with positive rheumatoid factor (HCC) Patient with rheumatoid arthritis with positive rheumatoid factor.  She has been on methotrexate in the past.  She reports that she was unable to follow-up with rheumatology due to loss of insurance and is currently not on any disease modifying medication.  She currently takes over-the-counter pain medication but would like refill for diclofenac.  She was made aware that she does need to eat prior to taking the medication to help avoid stomach upset.  She will also be given a very short taper of prednisone to help with current pain and inflammation.  She was asked to take over-the-counter Tylenol and avoid the use of nonsteroidal anti-inflammatory type drugs while on the prednisone but after completion of prednisone she may then resume use of diclofenac or nonsteroidal anti-inflammatory medicines but should not take both due to increased risk of stomach upset and GI bleed. - Ambulatory referral to Rheumatology - diclofenac  (VOLTAREN) 75 MG EC tablet; Take 1 tablet (75 mg total) by mouth 2 (two) times  daily. As needed for pain; take after eating  Dispense: 60 tablet; Refill: 5 - predniSONE (DELTASONE) 20 MG tablet; 2 pill today then one daily x 2 days and 1/2 daily x 2 days; take after eating  Dispense: 5 tablet; Refill: 0  3. Hyperlipidemia, unspecified hyperlipidemia type She is fasting at today's visit and will have comprehensive metabolic panel and lipid panel done in follow-up of hyperlipidemia and long-term use of statin medication.  Refill provided for atorvastatin 10 mg that she will be notified if dose needs to be increased based on today's blood work. - Comprehensive metabolic panel - Lipid panel - atorvastatin (LIPITOR) 10 MG tablet; Take 1 tablet (10 mg total) by mouth daily. To lower cholesterol  Dispense: 90 tablet; Refill: 1  4. Elevated blood sugar level On review of prior blood work, patient has had elevated blood sugar.  On her last comprehensive metabolic panel done [id number], glucose was elevated at 148.  She will have repeat comprehensive metabolic panel at today's visit as well as hemoglobin A1c in follow-up of prior elevated blood sugar levels. - HgB A1c - Comprehensive metabolic panel  5. Encounter for long-term current use of medication She will have comprehensive metabolic panel done in follow-up of long-term use of medications. - Comprehensive metabolic panel  6. Screening for colon cancer Patient will be referred for her screening colonoscopy.  Patient will be given time to apply for [facility] financial discount program to help with the cost of the procedure. - Ambulatory referral to Gastroenterology  7. Encounter for screening mammogram for malignant neoplasm of breast Order was placed for patient to have screening mammogram and patient was provided with scholarship information to help with the cost. - MM Digital Screening; Future  8. Prediabetes Patient with elevated blood sugars on  prior blood work and hemoglobin A1c was done at today's visit.  Discussed with the patient that hemoglobin A1c was 6.3 which is consistent with a diagnosis of prediabetes and close to 6.5 which is the value associated with a diagnosis of diabetes.  Discussed dietary changes including low carbohydrate/no concentrated sweets diet as well as increasing level of activity.  Patient also agreed to start the use of metformin to help with control of her blood sugars/insulin resistance.  Educational material on preventing type 2 diabetes given as part of after visit summary. - metFORMIN (GLUCOPHAGE) 500 MG tablet; Take 1 tablet (500 mg total) by mouth 2 (two) times daily with a meal.  Dispense: 180 tablet; Refill: 3   An After Visit Summary was printed and given to the patient.   Follow-up: Return in about 4 months (around 08/11/2019) for chronic issues.    Antony Blackbird, MD

## 2019-04-13 NOTE — Patient Instructions (Addendum)
Your Hgb-A1c test was 6.3 consistent with pre-diabetes or an increased risk of becoming diabetic. Values between 5.7-6.4 indicate prediabetes and levels of 6.5 or higher indicate diabetes. Please make efforts to stop drinking any regular sodas, sweet tea or juices and drink water or diet beverages. Eat fresh vegetables and lean meats when possible and limit or eliminate white bread, pasta and rice.   Preventing Type 2 Diabetes Mellitus Type 2 diabetes (type 2 diabetes mellitus) is a long-term (chronic) disease that affects blood sugar (glucose) levels. Normally, a hormone called insulin allows glucose to enter cells in the body. The cells use glucose for energy. In type 2 diabetes, one or both of these problems may be present:  The body does not make enough insulin.  The body does not respond properly to insulin that it makes (insulin resistance). Insulin resistance or lack of insulin causes excess glucose to build up in the blood instead of going into cells. As a result, high blood glucose (hyperglycemia) develops, which can cause many complications. Being overweight or obese and having an inactive (sedentary) lifestyle can increase your risk for diabetes. Type 2 diabetes can be delayed or prevented by making certain nutrition and lifestyle changes. What nutrition changes can be made?   Eat healthy meals and snacks regularly. Keep a healthy snack with you for when you get hungry between meals, such as fruit or a handful of nuts.  Eat lean meats and proteins that are low in saturated fats, such as chicken, fish, egg whites, and beans. Avoid processed meats.  Eat plenty of fruits and vegetables and plenty of grains that have not been processed (whole grains). It is recommended that you eat: ? 1?2 cups of fruit every day. ? 2?3 cups of vegetables every day. ? 6?8 oz of whole grains every day, such as oats, whole wheat, bulgur, brown rice, quinoa, and millet.  Eat low-fat dairy products, such as  milk, yogurt, and cheese.  Eat foods that contain healthy fats, such as nuts, avocado, olive oil, and canola oil.  Drink water throughout the day. Avoid drinks that contain added sugar, such as soda or sweet tea.  Follow instructions from your health care provider about specific eating or drinking restrictions.  Control how much food you eat at a time (portion size). ? Check food labels to find out the serving sizes of foods. ? Use a kitchen scale to weigh amounts of foods.  Saute or steam food instead of frying it. Cook with water or broth instead of oils or butter.  Limit your intake of: ? Salt (sodium). Have no more than 1 tsp (2,400 mg) of sodium a day. If you have heart disease or high blood pressure, have less than ? tsp (1,500 mg) of sodium a day. ? Saturated fat. This is fat that is solid at room temperature, such as butter or fat on meat. What lifestyle changes can be made? Activity   Do moderate-intensity physical activity for at least 30 minutes on at least 5 days of the week, or as much as told by your health care provider.  Ask your health care provider what activities are safe for you. A mix of physical activities may be best, such as walking, swimming, cycling, and strength training.  Try to add physical activity into your day. For example: ? Park in spots that are farther away than usual, so that you walk more. For example, park in a far corner of the parking lot when you go to the  office or the grocery store. ? Take a walk during your lunch break. ? Use stairs instead of elevators or escalators. Weight Loss  Lose weight as directed. Your health care provider can determine how much weight loss is best for you and can help you lose weight safely.  If you are overweight or obese, you may be instructed to lose at least 5?7 % of your body weight. Alcohol and Tobacco   Limit alcohol intake to no more than 1 drink a day for nonpregnant women and 2 drinks a day for  men. One drink equals 12 oz of beer, 5 oz of wine, or 1 oz of hard liquor.  Do not use any tobacco products, such as cigarettes, chewing tobacco, and e-cigarettes. If you need help quitting, ask your health care provider. Work With [facility] Provider  Have your blood glucose tested regularly, as told by your health care provider.  Discuss your risk factors and how you can reduce your risk for diabetes.  Get screening tests as told by your health care provider. You may have screening tests regularly, especially if you have certain risk factors for type 2 diabetes.  Make an appointment with a diet and nutrition specialist (registered dietitian). A registered dietitian can help you make a healthy eating plan and can help you understand portion sizes and food labels. Why are these changes important?  It is possible to prevent or delay type 2 diabetes and related health problems by making lifestyle and nutrition changes.  It can be difficult to recognize signs of type 2 diabetes. The best way to avoid possible damage to your body is to take actions to prevent the disease before you develop symptoms. What can happen if changes are not made?  Your blood glucose levels may keep increasing. Having high blood glucose for a long time is dangerous. Too much glucose in your blood can damage your blood vessels, heart, kidneys, nerves, and eyes.  You may develop prediabetes or type 2 diabetes. Type 2 diabetes can lead to many chronic health problems and complications, such as: ? Heart disease. ? Stroke. ? Blindness. ? Kidney disease. ? Depression. ? Poor circulation in the feet and legs, which could lead to surgical removal (amputation) in severe cases. Where to find support  Ask your health care provider to recommend a registered dietitian, diabetes educator, or weight loss program.  Look for local or online weight loss groups.  Join a gym, fitness club, or outdoor activity group, such as  a walking club. Where to find more information To learn more about diabetes and diabetes prevention, visit:  American Diabetes Association (ADA): www.diabetes.[employer] of Diabetes and Digestive and Kidney Diseases: FindSpin.nl To learn more about healthy eating, visit:  The U.S. Department of Agriculture Scientist, research (physical sciences)), Choose My Plate: http://wiley-williams.com/  Office of Disease Prevention and Health Promotion (ODPHP), Dietary Guidelines: SurferLive.at Summary  You can reduce your risk for type 2 diabetes by increasing your physical activity, eating healthy foods, and losing weight as directed.  Talk with your health care provider about your risk for type 2 diabetes. Ask about any blood tests or screening tests that you need to have. This information is not intended to replace advice given to you by your health care provider. Make sure you discuss any questions you have with your health care provider. Document Revised: 06/16/2018 Document Reviewed: 04/15/2015 Elsevier Patient Education  [place].

## 2019-04-13 NOTE — Progress Notes (Signed)
Hand pain

## 2019-04-17 ENCOUNTER — Other Ambulatory Visit: Payer: Self-pay

## 2019-04-17 ENCOUNTER — Ambulatory Visit: Payer: Self-pay | Attending: Family Medicine

## 2019-04-18 LAB — COMPREHENSIVE METABOLIC PANEL WITH GFR
ALT: 21 IU/L (ref 0–32)
AST: 18 IU/L (ref 0–40)
Albumin/Globulin Ratio: 1.8 (ref 1.2–2.2)
Albumin: 5.3 g/dL — ABNORMAL HIGH (ref 3.8–4.9)
Alkaline Phosphatase: 74 IU/L (ref 39–117)
BUN/Creatinine Ratio: 20 (ref 9–23)
BUN: 19 mg/dL (ref 6–24)
Bilirubin Total: 0.6 mg/dL (ref 0.0–1.2)
CO2: 19 mmol/L — ABNORMAL LOW (ref 20–29)
Calcium: 10.4 mg/dL — ABNORMAL HIGH (ref 8.7–10.2)
Chloride: 98 mmol/L (ref 96–106)
Creatinine, Ser: 0.94 mg/dL (ref 0.57–1.00)
GFR calc Af Amer: 78 mL/min/1.73
GFR calc non Af Amer: 68 mL/min/1.73
Globulin, Total: 2.9 g/dL (ref 1.5–4.5)
Glucose: 121 mg/dL — ABNORMAL HIGH (ref 65–99)
Potassium: 4.3 mmol/L (ref 3.5–5.2)
Sodium: 137 mmol/L (ref 134–144)
Total Protein: 8.2 g/dL (ref 6.0–8.5)

## 2019-04-18 LAB — LIPID PANEL
Chol/HDL Ratio: 2.4 ratio (ref 0.0–4.4)
Cholesterol, Total: 221 mg/dL — ABNORMAL HIGH (ref 100–199)
HDL: 92 mg/dL
LDL Chol Calc (NIH): 114 mg/dL — ABNORMAL HIGH (ref 0–99)
Triglycerides: 85 mg/dL (ref 0–149)
VLDL Cholesterol Cal: 15 mg/dL (ref 5–40)

## 2019-05-24 MED FILL — ?METFORMIN HCL 500MG TABLET: 500 | 30 days supply | Qty: 60 | Fill #1

## 2019-05-24 MED FILL — ?TRIAMTERENE/HCTZ 37.5/25TB: 37.5-25 | 30 days supply | Qty: 30 | Fill #1

## 2019-05-24 MED FILL — ?ATORVASTATIN CALCIUM 10MG: 10 | 30 days supply | Qty: 30 | Fill #1

## 2019-05-25 ENCOUNTER — Ambulatory Visit: Payer: Self-pay | Attending: Physician Assistant

## 2019-05-25 ENCOUNTER — Other Ambulatory Visit: Payer: Self-pay

## 2019-05-30 ENCOUNTER — Telehealth: Payer: Self-pay

## 2019-05-30 ENCOUNTER — Other Ambulatory Visit: Payer: Self-pay

## 2019-05-30 DIAGNOSIS — Z1231 Encounter for screening mammogram for malignant neoplasm of breast: Secondary | ICD-10-CM

## 2019-05-30 NOTE — Telephone Encounter (Signed)
Created in error

## 2019-06-06 ENCOUNTER — Encounter: Payer: Self-pay | Admitting: Family Medicine

## 2019-06-13 ENCOUNTER — Telehealth: Payer: Self-pay | Admitting: Family Medicine

## 2019-06-13 NOTE — Telephone Encounter (Signed)
Patient called and requested for an ibuprofen prescription to be written for the pain in her hands. Please follow up at your earliest convenience.

## 2019-06-14 ENCOUNTER — Other Ambulatory Visit: Payer: Self-pay | Admitting: Family Medicine

## 2019-06-14 DIAGNOSIS — M0579 Rheumatoid arthritis with rheumatoid factor of multiple sites without organ or systems involvement: Secondary | ICD-10-CM

## 2019-06-14 MED ORDER — IBUPROFEN 600 MG PO TABS: 600.0000 mg | ORAL_TABLET | Freq: Four times a day (QID) | ORAL | 3 refills | Status: DC | PRN

## 2019-06-14 MED FILL — IBUPROFEN 600 MG TABLET: 600 | 15 days supply | Qty: 60 | Fill #0

## 2019-06-14 NOTE — Progress Notes (Signed)
Patient ID: [name], female   DOB: [birth year], 59 y.o.   MRN: [id number]   Patient requesting a RX for Ibuprofen to help with hand pain.

## 2019-06-14 NOTE — Telephone Encounter (Signed)
Informed patient with what provider stated and she verbalized understanding.  

## 2019-06-14 NOTE — Telephone Encounter (Signed)
Rx  for ibuprofen was sent to [facility]

## 2019-06-21 ENCOUNTER — Other Ambulatory Visit: Payer: Self-pay | Admitting: Obstetrics and Gynecology

## 2019-06-21 ENCOUNTER — Other Ambulatory Visit: Payer: Self-pay

## 2019-06-21 ENCOUNTER — Ambulatory Visit: Payer: Self-pay

## 2019-06-21 VITALS — BP 130/80 | Temp 98.2°F | Wt 199.0 lb

## 2019-06-21 DIAGNOSIS — N644 Mastodynia: Secondary | ICD-10-CM

## 2019-06-21 DIAGNOSIS — Z1231 Encounter for screening mammogram for malignant neoplasm of breast: Secondary | ICD-10-CM

## 2019-06-21 DIAGNOSIS — N63 Unspecified lump in unspecified breast: Secondary | ICD-10-CM

## 2019-06-21 DIAGNOSIS — N6310 Unspecified lump in the right breast, unspecified quadrant: Secondary | ICD-10-CM

## 2019-06-21 NOTE — Progress Notes (Signed)
Ms. MICKIE ZIEBELL is a 59 y.o. female who presents to [facility] clinic today for clinical breast exam.      Breast History: Patient without breast complaints today.  She states she does not perform SBE, but has breast awareness.  Patient denies a family history of breast cancer.   Gynecological History: Pap smear not completed today. Patient with history of Hysterectomy in 2012.  She had her last Pap smear in April 2011 which was normal. Per patient has no history of an abnormal Pap smear. Last Pap smear result is available in Epic.   Physical exam: Breasts Breasts symmetrical. No skin abnormalities bilateral breasts. No nipple retraction bilateral breasts. No nipple discharge bilateral breasts. No lymphadenopathy.  Bilateral tenderness with 0.5cm lump palpated at 12 'o clock on right breast and 2x2cm lump palpated at 6' o clock on left breast.  Markers placed.      Pelvic/Bimanual Pap is not indicated today    Smoking History: Patient has never smoked.    Patient Navigation: Patient education provided. Access to services provided for patient through [facility] program.  Colorectal Cancer Screening: Per patient has never had colonoscopy completed No complaints today.    Breast and Cervical Cancer Risk Assessment: Patient does not have family history of breast cancer, known genetic mutations, or radiation treatment to the chest before age 15. Patient does not have history of cervical dysplasia, immunocompromised, or DES exposure in-utero.  Risk Assessment    Risk Scores      06/21/2019   Last edited by: Demetrius Revel, LPN   5-year risk: 1.1 %   Lifetime risk: 5.8 %          A: 59 year old Clinical Breast Exam Bilateral Breast Lumps Breat Tenderness  P: -Reviewed exam findings today.  Reiterated that findings are only lumps and no diagnosis has been made. -Discussed need for change of mammogram from screening to diagnostic. -Instructed to remove markers from breast and feel areas  on home tonight.  -Referred patient to the [facility] for a diagnostic mammogram. Appointment scheduled for April 20 at [facility]. -Encouraged to call if questions or concerns arise prior to next visit.    Gavin Pound, [place] 06/21/2019 2:27 PM

## 2019-06-28 ENCOUNTER — Ambulatory Visit
Admission: RE | Admit: 2019-06-28 | Discharge: 2019-06-28 | Disposition: A | Discharge status: Home / Self Care | Payer: Self-pay | Source: Ambulatory Visit | Attending: Obstetrics and Gynecology | Admitting: Obstetrics and Gynecology

## 2019-06-28 ENCOUNTER — Other Ambulatory Visit: Payer: Self-pay

## 2019-06-28 DIAGNOSIS — N644 Mastodynia: Secondary | ICD-10-CM

## 2019-06-28 DIAGNOSIS — N63 Unspecified lump in unspecified breast: Secondary | ICD-10-CM

## 2019-06-28 MED FILL — ?TRIAMTERENE/HCTZ 37.5/25TB: 37.5-25 | 30 days supply | Qty: 30 | Fill #2

## 2019-06-28 MED FILL — ?ATORVASTATIN CALCIUM 10MG: 10 | 30 days supply | Qty: 30 | Fill #2

## 2019-06-28 MED FILL — ?METFORMIN HCL 500MG TABLET: 500 | 30 days supply | Qty: 60 | Fill #2

## 2019-07-31 MED FILL — DICLOFENAC SOD EC 75 MG TAB: 75 | 30 days supply | Qty: 60 | Fill #1

## 2019-08-15 MED FILL — ?ATORVASTATIN 10 MG TABLET: 10 | 30 days supply | Qty: 30 | Fill #3

## 2019-10-05 MED FILL — ?TRIAMTERENE/HCTZ 37.5/25TB: 37.5-25 | 30 days supply | Qty: 30 | Fill #4

## 2019-10-05 MED FILL — ?ATORVASTATIN 10 MG TABLET: 10 | 30 days supply | Qty: 30 | Fill #4

## 2019-10-05 MED FILL — DICLOFENAC SOD EC 75 MG TAB: 75 | 30 days supply | Qty: 60 | Fill #2

## 2019-10-05 MED FILL — METFORMIN HCL 500 MG TABS: 500 | 30 days supply | Qty: 60 | Fill #3

## 2019-10-15 MED FILL — ?TRIAMTERENE/HCTZ 37.5/25TB: 37.5-25 | 30 days supply | Qty: 30 | Fill #4

## 2019-10-15 MED FILL — ?ATORVASTATIN 10 MG TABLET: 10 | 30 days supply | Qty: 30 | Fill #4

## 2019-10-15 MED FILL — ?METFORMIN HCL 500MG TABL: 500 | 30 days supply | Qty: 60 | Fill #3

## 2019-10-15 MED FILL — DICLOFENAC SOD EC 75 MG TAB: 75 | 30 days supply | Qty: 60 | Fill #2

## 2019-11-29 ENCOUNTER — Other Ambulatory Visit: Payer: Self-pay | Admitting: Family Medicine

## 2019-11-29 DIAGNOSIS — E785 Hyperlipidemia, unspecified: Secondary | ICD-10-CM

## 2019-11-29 DIAGNOSIS — I10 Essential (primary) hypertension: Secondary | ICD-10-CM

## 2019-11-29 MED ORDER — ATORVASTATIN CALCIUM 10 MG PO TABS: 10.0000 mg | ORAL_TABLET | Freq: Every day | ORAL | 1 refills | Status: DC

## 2019-11-29 MED ORDER — TRIAMTERENE-HCTZ 37.5-25 MG PO TABS: 1.0000 | ORAL_TABLET | Freq: Every day | ORAL | 0 refills | Status: DC

## 2019-11-29 MED FILL — ?ATORVASTATIN 10 MG TABLET: 10 | 30 days supply | Qty: 30 | Fill #0

## 2019-11-29 MED FILL — ?TRIAMTERENE/HCTZ 37.5/25TB: 37.5-25 | 30 days supply | Qty: 30 | Fill #0

## 2019-11-29 NOTE — Telephone Encounter (Signed)
Medication Refill - Medication: triamterene-hydrochlorothiazide (MAXZIDE-25) 37.5-25 MG tablet [[id number]]  atorvastatin (LIPITOR) 10 MG tablet [[id number]]     Preferred Pharmacy (with phone number or street name):  [facility], [address]  [place] [address]  Phone: [phone] Fax: [phone]  Hours: Not open 24 hours     Agent: Please be advised that RX refills may take up to 3 business days. We ask that you follow-up with your pharmacy.

## 2019-11-29 NOTE — Telephone Encounter (Signed)
Refill request for maxzide; last refill 06/21/19; no valid encounter within the last 6 months; no upcoming appt noted; pt notified; pt has appt scheduled for 12/21/19 at 1110; 30 day courtesy refill to cover pt until appt. Requested Prescriptions  Pending Prescriptions Disp Refills  . triamterene-hydrochlorothiazide (MAXZIDE-25) 37.5-25 MG tablet 90 tablet 1    Sig: Take 1 tablet by mouth daily.     Cardiovascular: Diuretic Combos Failed - 11/29/2019 10:49 AM      Failed - Ca in normal range and within 360 days    Calcium  Date Value Ref Range Status  04/17/2019 10.4 (H) 8.7 - 10.2 mg/dL Final   Calcium, Ion  Date Value Ref Range Status  04/30/2008 1.17 1.12 - 1.32 mmol/L Final         Failed - Valid encounter within last 6 months    Recent Outpatient Visits          7 months ago Essential hypertension   [place], [place], MD   1 year ago Essential hypertension   [place], [place], MD   1 year ago Essential hypertension   [place], [place], MD   2 years ago Essential hypertension   [place], [place], MD   2 years ago Follow up   [place], [place], FNP      Future Appointments            In 3 weeks Antony Blackbird, MD [place] - K in normal range and within 360 days    Potassium  Date Value Ref Range Status  04/17/2019 4.3 3.5 - 5.2 mmol/L Final         Passed - Na in normal range and within 360 days    Sodium  Date Value Ref Range Status  04/17/2019 137 134 - 144 mmol/L Final         Passed - Cr in normal range and within 360 days    Creat  Date Value Ref Range Status  03/24/2015 0.70 0.50 - 1.05 mg/dL Final   Creatinine, Ser  Date Value Ref Range Status  04/17/2019 0.94 0.57 - 1.00 mg/dL Final         Passed - Last BP in  normal range    BP Readings from Last 1 Encounters:  06/21/19 130/80         Signed Prescriptions Disp Refills   atorvastatin (LIPITOR) 10 MG tablet 90 tablet 1    Sig: Take 1 tablet (10 mg total) by mouth daily. To lower cholesterol     Cardiovascular:  Antilipid - Statins Failed - 11/29/2019 10:49 AM      Failed - Total Cholesterol in normal range and within 360 days    Cholesterol, Total  Date Value Ref Range Status  04/17/2019 221 (H) 100 - 199 mg/dL Final         Failed - LDL in normal range and within 360 days    LDL Chol Calc (NIH)  Date Value Ref Range Status  04/17/2019 114 (H) 0 - 99 mg/dL Final         Passed - HDL in normal range and within 360 days    HDL  Date Value Ref Range Status  04/17/2019 92 >39 mg/dL Final  Passed - Triglycerides in normal range and within 360 days    Triglycerides  Date Value Ref Range Status  04/17/2019 85 0 - 149 mg/dL Final         Passed - Patient is not pregnant      Passed - Valid encounter within last 12 months    Recent Outpatient Visits          7 months ago Essential hypertension   [facility] [place], [place], MD   1 year ago Essential hypertension   [place], [place], MD   1 year ago Essential hypertension   [place], [place], MD   2 years ago Essential hypertension   [place], [place], MD   2 years ago Follow up   [place], Maylon Peppers, FNP      Future Appointments            In 3 weeks Antony Blackbird, MD [place]

## 2019-11-29 NOTE — Telephone Encounter (Signed)
Requested Prescriptions  Pending Prescriptions Disp Refills  . atorvastatin (LIPITOR) 10 MG tablet 90 tablet 1    Sig: Take 1 tablet (10 mg total) by mouth daily. To lower cholesterol     Cardiovascular:  Antilipid - Statins Failed - 11/29/2019 10:49 AM      Failed - Total Cholesterol in normal range and within 360 days    Cholesterol, Total  Date Value Ref Range Status  04/17/2019 221 (H) 100 - 199 mg/dL Final         Failed - LDL in normal range and within 360 days    LDL Chol Calc (NIH)  Date Value Ref Range Status  04/17/2019 114 (H) 0 - 99 mg/dL Final         Passed - HDL in normal range and within 360 days    HDL  Date Value Ref Range Status  04/17/2019 92 >39 mg/dL Final         Passed - Triglycerides in normal range and within 360 days    Triglycerides  Date Value Ref Range Status  04/17/2019 85 0 - 149 mg/dL Final         Passed - Patient is not pregnant      Passed - Valid encounter within last 12 months    Recent Outpatient Visits          7 months ago Essential hypertension   [place] [facility], [place], MD   1 year ago Essential hypertension   [place], [place], MD   1 year ago Essential hypertension   [place], [place], MD   2 years ago Essential hypertension   [place], [place], MD   2 years ago Follow up   [place], Maylon Peppers, FNP      Future Appointments            In 3 weeks Antony Blackbird, MD [place]           . triamterene-hydrochlorothiazide (MAXZIDE-25) 37.5-25 MG tablet 90 tablet 1    Sig: Take 1 tablet by mouth daily.     Cardiovascular: Diuretic Combos Failed - 11/29/2019 10:49 AM      Failed - Ca in normal range and within 360 days    Calcium  Date Value Ref Range Status  04/17/2019 10.4 (H) 8.7 - 10.2 mg/dL Final    Calcium, Ion  Date Value Ref Range Status  04/30/2008 1.17 1.12 - 1.32 mmol/L Final         Failed - Valid encounter within last 6 months    Recent Outpatient Visits          7 months ago Essential hypertension   [facility] [place], [place], MD   1 year ago Essential hypertension   [facility] [place], [place], MD   1 year ago Essential hypertension    [facility] [place], [place], MD   2 years ago Essential hypertension   [place], [place], MD   2 years ago Follow up   [place], Maylon Peppers, FNP      Future Appointments            In 3 weeks Antony Blackbird, MD [place]  Passed - K in normal range and within 360 days    Potassium  Date Value Ref Range Status  04/17/2019 4.3 3.5 - 5.2 mmol/L Final         Passed - Na in normal range and within 360 days    Sodium  Date Value Ref Range Status  04/17/2019 137 134 - 144 mmol/L Final         Passed - Cr in normal range and within 360 days    Creat  Date Value Ref Range Status  03/24/2015 0.70 0.50 - 1.05 mg/dL Final   Creatinine, Ser  Date Value Ref Range Status  04/17/2019 0.94 0.57 - 1.00 mg/dL Final         Passed - Last BP in normal range    BP Readings from Last 1 Encounters:  06/21/19 130/80

## 2019-12-21 ENCOUNTER — Other Ambulatory Visit: Payer: Self-pay

## 2019-12-21 ENCOUNTER — Encounter: Payer: Self-pay | Admitting: Family Medicine

## 2019-12-21 ENCOUNTER — Ambulatory Visit: Payer: Self-pay | Attending: Family Medicine | Admitting: Family Medicine

## 2019-12-21 VITALS — BP 128/84 | HR 76 | Temp 97.7°F | Ht 71.0 in | Wt 211.2 lb

## 2019-12-21 DIAGNOSIS — K219 Gastro-esophageal reflux disease without esophagitis: Secondary | ICD-10-CM

## 2019-12-21 DIAGNOSIS — R7303 Prediabetes: Secondary | ICD-10-CM

## 2019-12-21 DIAGNOSIS — Z791 Long term (current) use of non-steroidal anti-inflammatories (NSAID): Secondary | ICD-10-CM

## 2019-12-21 DIAGNOSIS — I10 Essential (primary) hypertension: Secondary | ICD-10-CM

## 2019-12-21 DIAGNOSIS — M0579 Rheumatoid arthritis with rheumatoid factor of multiple sites without organ or systems involvement: Secondary | ICD-10-CM

## 2019-12-21 DIAGNOSIS — E785 Hyperlipidemia, unspecified: Secondary | ICD-10-CM

## 2019-12-21 DIAGNOSIS — Z79899 Other long term (current) drug therapy: Secondary | ICD-10-CM

## 2019-12-21 LAB — POCT GLYCOSYLATED HEMOGLOBIN (HGB A1C): Hemoglobin A1C: 6 % — AB (ref 4.0–5.6)

## 2019-12-21 MED ORDER — OMEPRAZOLE 20 MG PO CPDR: 20.0000 mg | DELAYED_RELEASE_CAPSULE | Freq: Two times a day (BID) | ORAL | 5 refills | Status: DC

## 2019-12-21 MED FILL — OMEPRAZOLE 20 MG CAP: 20 | 30 days supply | Qty: 60 | Fill #0

## 2019-12-21 NOTE — Progress Notes (Signed)
Joint pain in hands Knot on left wrist

## 2019-12-21 NOTE — Progress Notes (Signed)
Established Patient Office Visit  Subjective:  Patient ID: [name], female    DOB: [birth year]  Age: 59 y.o. MRN: [id number]  CC:  Chief Complaint  Patient presents with  . Pain    hands    HPI [name], 59 year old female, seen in follow-up of chronic medical issues including hypertension, prediabetes, rheumatoid arthritis and GERD.  She continues to have pain in multiple joints but states that the greatest area of pain is in her hands/thumbs.  Pain is always between a 6-8 on a 0-to-10 scale with 10 being the worst imaginable pain.  She has been taking ibuprofen which gives minimal relief.  She continues to have issues with swelling and stiffness in her hands as well.  She does have some episodes of acid reflux related to her use of ibuprofen.  She denies any blood in the stool or black stools.         She reports that her blood pressure has been controlled.  She denies headaches or dizziness related to her blood pressure.  She continues to take atorvastatin for her cholesterol.  She is tolerating Metformin without any issues.  She denies any nausea or stomach upset/diarrhea related to Metformin use.  She has had no increased thirst and no frequent urination.  Past Medical History:  Diagnosis Date  . Bilateral carpal tunnel syndrome   . Hypercholesteremia   . Hypertension   . OAB (overactive bladder)   . Rheumatoid arthritis [facility])     Past Surgical History:  Procedure Laterality Date  . ABDOMINAL HYSTERECTOMY      Family History  Problem Relation Age of Onset  . Hypertension Mother   . Diabetes Mother   . Cancer Father   . Diabetes Brother     Social History   Socioeconomic History  . Marital status: Single    Spouse name: Not on file  . Number of children: 2  . Years of education: Not on file  . Highest education level: 11th grade  Occupational History  . Not on file  Tobacco Use  . Smoking status: Never Smoker  . Smokeless tobacco: Never Used    Vaping Use  . Vaping Use: Never used  Substance and Sexual Activity  . Alcohol use: Yes    Comment: occasonl  . Drug use: Not Currently    Frequency: 2.0 times per week    Types: Marijuana  . Sexual activity: Not Currently    Birth control/protection: Surgical  Other Topics Concern  . Not on file  Social History Narrative  . Not on file   Social Determinants of Health   Financial Resource Strain:   . Difficulty of Paying Living Expenses: Not on file  Food Insecurity:   . Worried About Charity fundraiser in the Last Year: Not on file  . Ran Out of Food in the Last Year: Not on file  Transportation Needs: No Transportation Needs  . Lack of Transportation (Medical): No  . Lack of Transportation (Non-Medical): No  Physical Activity:   . Days of Exercise per Week: Not on file  . Minutes of Exercise per Session: Not on file  Stress:   . Feeling of Stress : Not on file  Social Connections:   . Frequency of Communication with Friends and Family: Not on file  . Frequency of Social Gatherings with Friends and Family: Not on file  . Attends Religious Services: Not on file  . Active Member of Clubs or Organizations: Not  on file  . Attends Archivist Meetings: Not on file  . Marital Status: Not on file  Intimate Partner Violence:   . Fear of Current or Ex-Partner: Not on file  . Emotionally Abused: Not on file  . Physically Abused: Not on file  . Sexually Abused: Not on file    Outpatient Medications Prior to Visit  Medication Sig Dispense Refill  . atorvastatin (LIPITOR) 10 MG tablet Take 1 tablet (10 mg total) by mouth daily. To lower cholesterol 90 tablet 1  . diclofenac (VOLTAREN) 75 MG EC tablet Take 1 tablet (75 mg total) by mouth 2 (two) times daily. As needed for pain; take after eating 60 tablet 5  . ibuprofen (ADVIL) 600 MG tablet Take 1 tablet (600 mg total) by mouth every 6 (six) hours as needed. Take after eating 60 tablet 3  . metFORMIN (GLUCOPHAGE) 500  MG tablet Take 1 tablet (500 mg total) by mouth 2 (two) times daily with a meal. 180 tablet 3  . triamterene-hydrochlorothiazide (MAXZIDE-25) 37.5-25 MG tablet Take 1 tablet by mouth daily. 30 tablet 0  . predniSONE (DELTASONE) 20 MG tablet 2 pill today then one daily x 2 days and 1/2 daily x 2 days; take after eating (Patient not taking: Reported on 12/21/2019) 5 tablet 0   No facility-administered medications prior to visit.    No Known Allergies  ROS Review of Systems  Constitutional: Positive for fatigue. Negative for chills and fever.  HENT: Negative for sore throat and trouble swallowing.   Respiratory: Negative for cough and shortness of breath.   Cardiovascular: Negative for chest pain and palpitations.  Gastrointestinal: Negative for abdominal pain, blood in stool, constipation and diarrhea.  Endocrine: Negative for polydipsia, polyphagia and polyuria.  Genitourinary: Negative for dysuria and frequency.  Musculoskeletal: Positive for arthralgias and joint swelling.  Neurological: Positive for numbness (And hands/has carpal tunnel syndrome as well). Negative for dizziness and headaches.  Hematological: Negative for adenopathy. Does not bruise/bleed easily.  Psychiatric/Behavioral: Negative for suicidal ideas. The patient is nervous/anxious (About health).       Objective:    Physical Exam Vitals and nursing note reviewed.  Constitutional:      Appearance: Normal appearance.  Neck:     Vascular: No carotid bruit.  Cardiovascular:     Rate and Rhythm: Normal rate and regular rhythm.  Pulmonary:     Effort: Pulmonary effort is normal.     Breath sounds: Normal breath sounds.  Abdominal:     Palpations: Abdomen is soft.     Tenderness: There is no abdominal tenderness. There is no right CVA tenderness, left CVA tenderness, guarding or rebound.  Musculoskeletal:        General: Swelling and tenderness present.     Cervical back: Normal range of motion and neck supple. No  rigidity or tenderness.     Right lower leg: No edema.     Left lower leg: No edema.     Comments: Bilateral swelling of the hands/fingers and MCP joints with tenderness to palp  Lymphadenopathy:     Cervical: No cervical adenopathy.  Skin:    General: Skin is warm and dry.     Comments: Ganglion cyst on the left dorsum of the wrist  Neurological:     General: No focal deficit present.     Mental Status: She is alert and oriented to person, place, and time.  Psychiatric:        Mood and Affect: Mood normal.  Behavior: Behavior normal.     BP 128/84 (BP Location: Right Arm, Patient Position: Sitting)   Pulse 76   Temp 97.7 F (36.5 C)   Ht 5\' 11"  (1.803 m)   Wt 211 lb 3.2 oz (95.8 kg)   SpO2 98%   BMI 29.46 kg/m  Wt Readings from Last 3 Encounters:  12/21/19 211 lb 3.2 oz (95.8 kg)  06/21/19 199 lb (90.3 kg)  04/13/19 216 lb (98 kg)     Health Maintenance Due  Topic Date Due  . COVID-19 Vaccine (1) Never done  . COLONOSCOPY  Never done  . PAP SMEAR-Modifier  03/11/2012  . MAMMOGRAM  05/31/2013  . INFLUENZA VACCINE  Never done     Lab Results  Component Value Date   TSH 0.678 11/05/2013   Lab Results  Component Value Date   WBC 3.4 (L) 09/13/2018   HGB 13.9 09/13/2018   HCT 40.3 09/13/2018   MCV 87.6 09/13/2018   PLT 177 09/13/2018   Lab Results  Component Value Date   NA 137 04/17/2019   K 4.3 04/17/2019   CO2 19 (L) 04/17/2019   GLUCOSE 121 (H) 04/17/2019   BUN 19 04/17/2019   CREATININE 0.94 04/17/2019   BILITOT 0.6 04/17/2019   ALKPHOS 74 04/17/2019   AST 18 04/17/2019   ALT 21 04/17/2019   PROT 8.2 04/17/2019   ALBUMIN 5.3 (H) 04/17/2019   CALCIUM 10.4 (H) 04/17/2019   ANIONGAP 14 09/13/2018   Lab Results  Component Value Date   CHOL 221 (H) 04/17/2019   Lab Results  Component Value Date   HDL 92 04/17/2019   Lab Results  Component Value Date   LDLCALC 114 (H) 04/17/2019   Lab Results  Component Value Date   TRIG 85  04/17/2019   Lab Results  Component Value Date   CHOLHDL 2.4 04/17/2019   Lab Results  Component Value Date   HGBA1C 6.3 04/13/2019      Assessment & Plan:  1. Prediabetes Patient with hemoglobin A1c of 6.3 in February of this year and she is currently on Metformin 500 mg once daily.  She is to continue healthy, low carbohydrate diet in addition to Metformin.  She is encouraged to exercise as tolerated but is somewhat limited due to joint pain associated with rheumatoid arthritis.  Hemoglobin A1c at today's visit is improved to 6.0.  She will have comprehensive metabolic panel at her upcoming fasting lab visit. - HgB A1c - Comprehensive metabolic panel; Future  2. Essential hypertension Continue use of triamterene hydrochlorothiazide/Maxide.  Electrolytes will be checked as part of comprehensive metabolic panel at an upcoming appointment.  She will also have fasting lipid panel. - Lipid Panel; Future  3. Rheumatoid arthritis involving multiple sites with positive rheumatoid factor ([place]) Patient with rheumatoid arthritis but is not currently on any disease modifying therapy.  She has been referred to rheumatology for further evaluation and treatment.  Labs placed for CCP antibody and comprehensive metabolic panel in follow-up of use of over-the-counter pain medications.  She did have positive rheumatoid factor on review of chart on 11/17/2016. - Comprehensive metabolic panel; Future - CYCLIC CITRUL PEPTIDE ANTIBODY, IGG/IGA; Future - Ambulatory referral to Rheumatology  4. Hyperlipidemia, unspecified hyperlipidemia type 5. Encounter for long-term current use of medication She will return for fasting labs including lipid panel and follow-up with hyperlipidemia with last lipid panel done 04/17/2019 with total cholesterol of 221 and LDL of 114.  She is currently on atorvastatin 10 mg  and will have comprehensive metabolic panel at her upcoming fasting lab visit in follow-up of use of statin  medication. - Comprehensive metabolic panel; Future  6. Gastroesophageal reflux disease, unspecified whether esophagitis present; 7.  Long-term use of nonsteroidal anti-inflammatories Omeprazole 20 mg twice daily for treatment of acid reflux as well as avoidance of known trigger foods.  Patient has been taking OTC nonsteroidal anti-inflammatories to help with joint pain related to her rheumatoid arthritis.  Patient will have CBC at her upcoming lab visit. - omeprazole (PRILOSEC) 20 MG capsule; Take 1 capsule (20 mg total) by mouth 2 (two) times daily. To reduce stomach acid  Dispense: 60 capsule; Refill: 5 - CBC; Future     Follow-up: Return in about 4 months (around 04/22/2020) for chronic issues; schedule appt for fasting labs.     Antony Blackbird, MD

## 2019-12-25 ENCOUNTER — Other Ambulatory Visit: Payer: Self-pay

## 2019-12-25 ENCOUNTER — Ambulatory Visit: Payer: Self-pay | Attending: Family Medicine

## 2019-12-25 DIAGNOSIS — M0579 Rheumatoid arthritis with rheumatoid factor of multiple sites without organ or systems involvement: Secondary | ICD-10-CM

## 2019-12-25 DIAGNOSIS — Z79899 Other long term (current) drug therapy: Secondary | ICD-10-CM

## 2019-12-25 DIAGNOSIS — K219 Gastro-esophageal reflux disease without esophagitis: Secondary | ICD-10-CM

## 2019-12-25 DIAGNOSIS — I10 Essential (primary) hypertension: Secondary | ICD-10-CM

## 2019-12-25 DIAGNOSIS — R7303 Prediabetes: Secondary | ICD-10-CM

## 2019-12-27 LAB — COMPREHENSIVE METABOLIC PANEL WITH GFR
ALT: 17 IU/L (ref 0–32)
AST: 19 IU/L (ref 0–40)
Albumin/Globulin Ratio: 1.7 (ref 1.2–2.2)
Albumin: 4.9 g/dL (ref 3.8–4.9)
Alkaline Phosphatase: 70 IU/L (ref 44–121)
BUN/Creatinine Ratio: 18 (ref 9–23)
BUN: 13 mg/dL (ref 6–24)
Bilirubin Total: 0.6 mg/dL (ref 0.0–1.2)
CO2: 22 mmol/L (ref 20–29)
Calcium: 9.9 mg/dL (ref 8.7–10.2)
Chloride: 100 mmol/L (ref 96–106)
Creatinine, Ser: 0.71 mg/dL (ref 0.57–1.00)
GFR calc Af Amer: 109 mL/min/1.73
GFR calc non Af Amer: 95 mL/min/1.73
Globulin, Total: 2.9 g/dL (ref 1.5–4.5)
Glucose: 124 mg/dL — ABNORMAL HIGH (ref 65–99)
Potassium: 4.4 mmol/L (ref 3.5–5.2)
Sodium: 138 mmol/L (ref 134–144)
Total Protein: 7.8 g/dL (ref 6.0–8.5)

## 2019-12-27 LAB — CBC
Hematocrit: 38.7 % (ref 34.0–46.6)
Hemoglobin: 13.2 g/dL (ref 11.1–15.9)
MCH: 30.4 pg (ref 26.6–33.0)
MCHC: 34.1 g/dL (ref 31.5–35.7)
MCV: 89 fL (ref 79–97)
Platelets: 238 x10E3/uL (ref 150–450)
RBC: 4.34 x10E6/uL (ref 3.77–5.28)
RDW: 14.3 % (ref 11.7–15.4)
WBC: 4.9 x10E3/uL (ref 3.4–10.8)

## 2019-12-27 LAB — CYCLIC CITRUL PEPTIDE ANTIBODY, IGG/IGA: Cyclic Citrullin Peptide Ab: 7 U (ref 0–19)

## 2019-12-27 LAB — LIPID PANEL
Chol/HDL Ratio: 2.3 ratio (ref 0.0–4.4)
Cholesterol, Total: 196 mg/dL (ref 100–199)
HDL: 84 mg/dL
LDL Chol Calc (NIH): 94 mg/dL (ref 0–99)
Triglycerides: 104 mg/dL (ref 0–149)
VLDL Cholesterol Cal: 18 mg/dL (ref 5–40)

## 2020-01-02 ENCOUNTER — Ambulatory Visit: Payer: Self-pay

## 2020-01-07 MED FILL — ?ATORVASTATIN 10 MG TABLET: 10 | 30 days supply | Qty: 30 | Fill #1

## 2020-01-08 ENCOUNTER — Telehealth: Payer: Self-pay | Admitting: Family Medicine

## 2020-01-08 ENCOUNTER — Other Ambulatory Visit: Payer: Self-pay

## 2020-01-08 ENCOUNTER — Ambulatory Visit: Payer: Self-pay | Attending: Family Medicine

## 2020-01-08 MED FILL — TRIAMTERENE-HCTZ 37.5-25 MG: 37.5-25 | 30 days supply | Qty: 30 | Fill #5

## 2020-01-08 MED FILL — ?METFORMIN HCL 500MG TABL: 500 | 30 days supply | Qty: 60 | Fill #4

## 2020-02-12 ENCOUNTER — Ambulatory Visit: Payer: Self-pay

## 2020-02-12 ENCOUNTER — Ambulatory Visit (INDEPENDENT_AMBULATORY_CARE_PROVIDER_SITE_OTHER): Payer: Self-pay | Admitting: Internal Medicine

## 2020-02-12 ENCOUNTER — Encounter: Payer: Self-pay | Admitting: Internal Medicine

## 2020-02-12 ENCOUNTER — Other Ambulatory Visit: Payer: Self-pay

## 2020-02-12 VITALS — BP 112/69 | HR 78 | Ht 68.0 in | Wt 216.0 lb

## 2020-02-12 DIAGNOSIS — M79642 Pain in left hand: Secondary | ICD-10-CM

## 2020-02-12 DIAGNOSIS — M79641 Pain in right hand: Secondary | ICD-10-CM

## 2020-02-12 DIAGNOSIS — M25522 Pain in left elbow: Secondary | ICD-10-CM | POA: Insufficient documentation

## 2020-02-12 DIAGNOSIS — R768 Other specified abnormal immunological findings in serum: Secondary | ICD-10-CM

## 2020-02-12 DIAGNOSIS — M1710 Unilateral primary osteoarthritis, unspecified knee: Secondary | ICD-10-CM

## 2020-02-12 HISTORY — DX: Pain in left hand: M79.641

## 2020-02-12 NOTE — Progress Notes (Signed)
 Office Visit Note  Patient: [name]             Date of Birth: 02/14/1961           MRN: [id number]             PCP: Fulp, Cammie, MD Referring: Fulp, Cammie, MD Visit Date: 02/12/2020   Subjective:  Pain of the Left Hand, Pain of the Right Hand, Pain of the Right Wrist, Pain of the Left Wrist, and New Patient (Initial Visit)   History of Present Illness: [name] is a 59 y.o. female here for evaluation of bilateral hand and wrist pain worsening over the past year with previous positive RF serology. She states that she had a workup for rheumatoid arthritis in the past and had some testing after thumb swelling and pain 3-4 years ago but never any specific diagnosis identified. Now she is noticing a lot pain and has difficulty tightly closing her grip especially on the right. Her hand pain worsens over the day with use. She has not found any OTC medication to be very helpful. She has had carpal tunnel syndrome hand pain in the past but not similar to this pain mostly localized over the wrist and MCP joints. She has no known family history of rheumatoid arthritis or other autoimmune disease.  Labs reviewed 12/2019 CCP negative CMP normal  11/2016  RF 32 Uric acid 5.5  Activities of Daily Living:  Patient reports morning stiffness for 24 hours.   Patient Reports nocturnal pain.  Difficulty dressing/grooming: Denies Difficulty climbing stairs: Reports Difficulty getting out of chair: Reports Difficulty using hands for taps, buttons, cutlery, and/or writing: Reports  Review of Systems  Constitutional: Negative for fatigue.  HENT: Negative for mouth sores, mouth dryness and nose dryness.   Eyes: Positive for visual disturbance. Negative for pain, itching and dryness.  Respiratory: Negative for cough, hemoptysis, shortness of breath and difficulty breathing.   Cardiovascular: Negative for chest pain, palpitations and swelling in legs/feet.  Gastrointestinal: Positive for  constipation. Negative for abdominal pain, blood in stool and diarrhea.  Endocrine: Negative for increased urination.  Genitourinary: Negative for painful urination.  Musculoskeletal: Positive for arthralgias, joint pain, joint swelling, myalgias, morning stiffness, muscle tenderness and myalgias. Negative for muscle weakness.  Skin: Negative for color change, rash and redness.  Allergic/Immunologic: Negative for susceptible to infections.  Neurological: Positive for dizziness and numbness. Negative for headaches, memory loss and weakness.  Hematological: Negative for swollen glands.  Psychiatric/Behavioral: Positive for sleep disturbance. Negative for confusion.    PMFS History:  Patient Active Problem List   Diagnosis Date Noted  . Pain in left elbow 02/12/2020  . Bilateral hand pain 02/12/2020  . Rheumatoid factor positive 12/10/2016  . Chronic arthralgias of knees and hips 12/10/2016  . 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 Knee pain, chronic 04/02/2015  . DIVERTICULAR DISEASE 11/26/2009  . ALLERGIC RHINITIS 10/16/2009  . RECTAL PAIN 10/16/2009  . CONSTIPATION 03/11/2009  . ANEMIA, IRON DEFICIENCY 02/21/2009  . HYPERTENSION, BENIGN ESSENTIAL 02/19/2009  . GERD 02/19/2009  . VAGINAL PRURITUS 02/19/2009  . FREQUENCY, URINARY 02/19/2009  . MENOPAUSE, SURGICAL 03/08/2004    Past Medical History:  Diagnosis Date  . Bilateral carpal tunnel syndrome   . Hypercholesteremia   . Hypertension   . OAB (overactive bladder)   . Rheumatoid arthritis ([facility])     Family History  Problem Relation Age of Onset  . Hypertension Mother   . Diabetes Mother   .   Cancer Father   . Diabetes Brother    Past Surgical History:  Procedure Laterality Date  . ABDOMINAL HYSTERECTOMY     Social History   Social History Narrative  . Not on file   Immunization History  Administered Date(s) Administered  . Td 03/08/2004  . Tdap 11/29/2017     Objective: Vital Signs: BP 112/69 (BP Location:  Left Arm, Patient Position: Sitting, Cuff Size: Small)   Pulse 78   Ht 5' 8" (1.727 m)   Wt 216 lb (98 kg)   BMI 32.84 kg/m    Physical Exam Constitutional:      Appearance: She is obese.  HENT:     Right Ear: External ear normal.     Left Ear: External ear normal.     Mouth/Throat:     Mouth: Mucous membranes are moist.     Pharynx: Oropharynx is clear.  Eyes:     Conjunctiva/sclera: Conjunctivae normal.  Cardiovascular:     Rate and Rhythm: Normal rate and regular rhythm.  Pulmonary:     Effort: Pulmonary effort is normal.     Breath sounds: Normal breath sounds.  Skin:    General: Skin is warm and dry.     Findings: No rash.  Neurological:     General: No focal deficit present.     Mental Status: She is alert.     Musculoskeletal Exam:  Neck full range of motion no tenderness Shoulder, elbow, wrist full ROM, some wrist tenderness more over radial side, negative tinel's and phalen's tests Squaring of 1st CMC joint, right 2nd PIP enlarged relative to left and reduced flexion ROM of 2nd and 3rd digits Normal hip internal and external rotation without pain Knees full range of motion no swelling patellofemoral crepitus present bilaterally   Investigation: No additional findings.  Imaging: XR Hand 2 View Left  Result Date: 02/12/2020 X-ray left hand 2 views Radiocarpal joint space appears normal.  Severe first CMC joint arthritis with partial subluxation of the first metacarpal.  Abrupt fusion of decreased mineralization at first metacarpal head without any clearly seen fracture or lesion.  Decreased mineralization of proximal and distal first phalanges.  Slight rotation of digits on AP image.  Ulnar side osteophytes of first MCP joint.  Otherwise MCP joint spaces preserved.  PIP joint spaces fairly intact some third PIP osteophytes.  DIP osteophytes throughout most prominent at second digit. Impression Severe first CMC joint degenerative arthritis with moderate DIP joint as  well as 2nd MCP osteophytes  XR Hand 2 View Right  Result Date: 02/12/2020 Radiocarpal joint space intact but question multiple cystic lesions of the scaphoid.  Moderate to severe first CMC joint arthritis.  Relative demineralization of first metacarpal head and thumb phalanges.  Ulnar osteophytes on second and third proximal phalanx bases.  Small radial osteophyte on third metacarpal head.  PIP joint spaces appear intact but with very prominent osteophytes on third middle phalanx and on radial aspect of fifth PIP.  Second DIP with marginal joint space decrease in osteophytes on DIPs especially second and fifth. Impression Degenerative arthritis diffusely but especially at first CMC joint, multiple cystic changes no clear erosions seen   Recent Labs: Lab Results  Component Value Date   WBC 4.9 12/25/2019   HGB 13.2 12/25/2019   PLT 238 12/25/2019   NA 138 12/25/2019   K 4.4 12/25/2019   CL 100 12/25/2019   CO2 22 12/25/2019   GLUCOSE 124 (H) 12/25/2019   BUN 13 12/25/2019     CREATININE 0.71 12/25/2019   BILITOT 0.6 12/25/2019   ALKPHOS 70 12/25/2019   AST 19 12/25/2019   ALT 17 12/25/2019   PROT 7.8 12/25/2019   ALBUMIN 4.9 12/25/2019   CALCIUM 9.9 12/25/2019   GFRAA 109 12/25/2019    Speciality Comments: No specialty comments available.  Procedures:  No procedures performed Allergies: Patient has no known allergies.   Assessment / Plan:     Visit Diagnoses: Rheumatoid factor positive  Bilateral hand pain - Plan: XR Hand 2 View Right, XR Hand 2 View Left, Sedimentation rate, C-reactive protein, Rheumatoid factor  Bilateral hand and wrist pain no inflammatory changes are seen on exam today. Will repeat RF also check ESR, CRP for evidence of subclinical inflammation. Xray of bilateral hands demonstrate degenerative changes with CMC squaring, cystic changes, and osteophytes and without demineralization or erosions to suggest RA. We can follow up if labs significantly abnormal else  would manage strictly as OA.  Patellofemoral arthritis  She has bilateral patellofemoral crepitus consistent with osteoarthritis but has no effusions and full ROM so no specific new intervention recommended.   Orders: Orders Placed This Encounter  Procedures  . XR Hand 2 View Right  . XR Hand 2 View Left  . Sedimentation rate  . C-reactive protein  . Rheumatoid factor   No orders of the defined types were placed in this encounter.   Follow-Up Instructions: No follow-ups on file.   Collier Salina, MD  Note - This record has been created using [employer].  Chart creation errors have been sought, but may not always  have been located. Such creation errors do not reflect on  the standard of medical care.

## 2020-02-13 LAB — C-REACTIVE PROTEIN: CRP: 1.1 mg/L (ref <8.0)

## 2020-02-13 LAB — RHEUMATOID FACTOR: Rheumatoid fact SerPl-aCnc: <14 IU/mL (ref <14)

## 2020-02-13 LAB — SEDIMENTATION RATE: Sed Rate: 14 mm/h (ref 0–30)

## 2020-02-18 NOTE — Progress Notes (Signed)
Lab results are negative for rheumatoid factor today, which is a change from her past positive antibodies. Her markers for inflammation are both negative. Her xrays of both hands show a significant degree of osteoarthritis. I believe this is the cause of her current joint pain. We can see her as needed for joint injections if specific joints get worse but otherwise she can follow up with her regular doctor.

## 2020-03-13 MED FILL — ?ATORVASTATIN 10 MG TABLET: 10 | 30 days supply | Qty: 30 | Fill #2

## 2020-03-13 MED FILL — METFORMIN HCL 500 MG TABS: 500 | 30 days supply | Qty: 60 | Fill #5

## 2020-03-13 MED FILL — DICLOFENAC SOD EC 75 MG TAB: 75 | 30 days supply | Qty: 60 | Fill #3

## 2020-03-14 ENCOUNTER — Other Ambulatory Visit: Payer: Self-pay | Admitting: Family Medicine

## 2020-03-14 DIAGNOSIS — I10 Essential (primary) hypertension: Secondary | ICD-10-CM

## 2020-03-14 MED FILL — TRIAMTERENE-HCTZ 37.5-25 MG: 37.5-25 | 30 days supply | Qty: 30 | Fill #0

## 2020-04-23 ENCOUNTER — Ambulatory Visit: Payer: Self-pay | Admitting: Family Medicine

## 2020-04-30 MED FILL — ?ATORVASTATIN 10 MG TABLET: 10 | 30 days supply | Qty: 30 | Fill #3

## 2020-04-30 MED FILL — TRIAMTERENE-HCTZ 37.5-25 MG: 37.5-25 | 30 days supply | Qty: 30 | Fill #1

## 2020-05-29 ENCOUNTER — Other Ambulatory Visit: Payer: Self-pay | Admitting: Critical Care Medicine

## 2020-05-29 ENCOUNTER — Other Ambulatory Visit: Payer: Self-pay | Admitting: Family Medicine

## 2020-05-29 DIAGNOSIS — R7303 Prediabetes: Secondary | ICD-10-CM

## 2020-05-29 MED FILL — METFORMIN HCL 500 MG TABS: 500 | 30 days supply | Qty: 60 | Fill #0

## 2020-05-29 NOTE — Telephone Encounter (Signed)
Requested medication (s) are due for refill today: expired medication  Requested medication (s) are on the active medication list: yes  Last refill:  04/13/19 #180 3 refills  Future visit scheduled: yes in 2 weeks  Notes to clinic:  expired medication. Do you want to renew Rx?     Requested Prescriptions  Pending Prescriptions Disp Refills   metFORMIN (GLUCOPHAGE) 500 MG tablet [Pharmacy Med Name: METFORMIN HCL 500 MG TABS 500 Tablet] 60 tablet 3    Sig: Take 1 tablet (500 mg total) by mouth 2 (two) times daily with a meal.      Endocrinology:  Diabetes - Biguanides Passed - 05/29/2020  9:34 AM      Passed - Cr in normal range and within 360 days    Creat  Date Value Ref Range Status  03/24/2015 0.70 0.50 - 1.05 mg/dL Final   Creatinine, Ser  Date Value Ref Range Status  12/25/2019 0.71 0.57 - 1.00 mg/dL Final          Passed - HBA1C is between 0 and 7.9 and within 180 days    Hemoglobin A1C  Date Value Ref Range Status  12/21/2019 6.0 (A) 4.0 - 5.6 % Final   HbA1c, POC (controlled diabetic range)  Date Value Ref Range Status  04/13/2019 6.3 0.0 - 7.0 % Final          Passed - AA eGFR in normal range and within 360 days    GFR, Est African American  Date Value Ref Range Status  03/24/2015 >89 >=60 mL/min Final   GFR calc Af Amer  Date Value Ref Range Status  12/25/2019 109 >59 mL/min/1.73 Final    Comment:    **In accordance with recommendations from the NKF-ASN Task force,**   [facility] is in the process of updating its eGFR calculation to the   2021 CKD-EPI creatinine equation that estimates kidney function   without a race variable.    GFR, Est Non African American  Date Value Ref Range Status  03/24/2015 >89 >=60 mL/min Final    Comment:      The estimated GFR is a calculation valid for adults (>=39 years old) that uses the CKD-EPI algorithm to adjust for age and sex. It is   not to be used for children, pregnant women, hospitalized patients,    patients  on dialysis, or with rapidly changing kidney function. According to the NKDEP, eGFR >89 is normal, 60-89 shows mild impairment, 30-59 shows moderate impairment, 15-29 shows severe impairment and <15 is ESRD.      GFR calc non Af Amer  Date Value Ref Range Status  12/25/2019 95 >59 mL/min/1.73 Final          Passed - Valid encounter within last 6 months    Recent Outpatient Visits           5 months ago Prediabetes   [place] Fulp, [place], MD   1 year ago Essential hypertension   [facility] [place], [place], MD   1 year ago Essential hypertension    [facility] [place], [place], MD   2 years ago Essential hypertension   [place], [place], MD   2 years ago Essential hypertension   [place], MD       Future Appointments             In 2 weeks  Charlott Rakes, MD [place]

## 2020-05-31 NOTE — Progress Notes (Deleted)
Subjective:    Patient ID: [name], female    DOB: Feb 23, 1961, 60 y.o.   MRN: [id number]  59 y.o.F  Here to tfr PCP from [facility]  06/02/20 Last seen 12/2019 by fulp:  [name], 60 year old female, seen in follow-up of chronic medical issues including hypertension, prediabetes, rheumatoid arthritis and GERD.  She continues to have pain in multiple joints but states that the greatest area of pain is in her hands/thumbs.  Pain is always between a 6-8 on a 0-to-10 scale with 10 being the worst imaginable pain.  She has been taking ibuprofen which gives minimal relief.  She continues to have issues with swelling and stiffness in her hands as well.  She does have some episodes of acid reflux related to her use of ibuprofen.  She denies any blood in the stool or black stools.         She reports that her blood pressure has been controlled.  She denies headaches or dizziness related to her blood pressure.  She continues to take atorvastatin for her cholesterol.  She is tolerating Metformin without any issues.  She denies any nausea or stomach upset/diarrhea related to Metformin use.  She has had no increased thirst and no frequent urination.  Prediabetes Patient with hemoglobin A1c of 6.3 in February of this year and she is currently on Metformin 500 mg once daily.  She is to continue healthy, low carbohydrate diet in addition to Metformin.  She is encouraged to exercise as tolerated but is somewhat limited due to joint pain associated with rheumatoid arthritis.  Hemoglobin A1c at today's visit is improved to 6.0.  She will have comprehensive metabolic panel at her upcoming fasting lab visit. - HgB A1c - Comprehensive metabolic panel; Future  2. Essential hypertension Continue use of triamterene hydrochlorothiazide/Maxide.  Electrolytes will be checked as part of comprehensive metabolic panel at an upcoming appointment.  She will also have fasting lipid panel. - Lipid Panel; Future  3.  Rheumatoid arthritis involving multiple sites with positive rheumatoid factor ([place]) Patient with rheumatoid arthritis but is not currently on any disease modifying therapy.  She has been referred to rheumatology for further evaluation and treatment.  Labs placed for CCP antibody and comprehensive metabolic panel in follow-up of use of over-the-counter pain medications.  She did have positive rheumatoid factor on review of chart on 11/17/2016. - Comprehensive metabolic panel; Future - CYCLIC CITRUL PEPTIDE ANTIBODY, IGG/IGA; Future - Ambulatory referral to Rheumatology  4. Hyperlipidemia, unspecified hyperlipidemia type 5. Encounter for long-term current use of medication She will return for fasting labs including lipid panel and follow-up with hyperlipidemia with last lipid panel done 04/17/2019 with total cholesterol of 221 and LDL of 114.  She is currently on atorvastatin 10 mg and will have comprehensive metabolic panel at her upcoming fasting lab visit in follow-up of use of statin medication. - Comprehensive metabolic panel; Future  6. Gastroesophageal reflux disease, unspecified whether esophagitis present; 7.  Long-term use of nonsteroidal anti-inflammatories Omeprazole 20 mg twice daily for treatment of acid reflux as well as avoidance of known trigger foods.  Patient has been taking OTC nonsteroidal anti-inflammatories to help with joint pain related to her rheumatoid arthritis.  Patient will have CBC at her upcoming lab visit. - omeprazole (PRILOSEC) 20 MG capsule; Take 1 capsule (20 mg total) by mouth 2 (two) times daily. To reduce stomach acid  Dispense: 60 capsule; Refill: 5 - CBC; Future      Review of  Systems     Objective:   Physical Exam There were no vitals filed for this visit.  Gen: Pleasant, well-nourished, in no distress,  normal affect  ENT: No lesions,  mouth clear,  oropharynx clear, no postnasal drip  Neck: No JVD, no TMG, no carotid bruits  Lungs: No use of  accessory muscles, no dullness to percussion, clear without rales or rhonchi  Cardiovascular: RRR, heart sounds normal, no murmur or gallops, no peripheral edema  Abdomen: soft and NT, no HSM,  BS normal  Musculoskeletal: No deformities, no cyanosis or clubbing  Neuro: alert, non focal  Skin: Warm, no lesions or rashes  No results found.        Assessment & Plan:  I personally reviewed all images and lab data in the [facility] system as well as any outside material available during this office visit and agree with the  radiology impressions.   No problem-specific Assessment & Plan notes found for this encounter.   There are no diagnoses linked to this encounter.

## 2020-06-02 ENCOUNTER — Ambulatory Visit: Payer: Self-pay | Admitting: Critical Care Medicine

## 2020-06-07 ENCOUNTER — Other Ambulatory Visit: Payer: Self-pay

## 2020-06-10 ENCOUNTER — Other Ambulatory Visit: Payer: Self-pay | Admitting: Family Medicine

## 2020-06-10 ENCOUNTER — Other Ambulatory Visit: Payer: Self-pay

## 2020-06-10 DIAGNOSIS — I10 Essential (primary) hypertension: Secondary | ICD-10-CM

## 2020-06-10 MED ORDER — TRIAMTERENE-HCTZ 37.5-25 MG PO TABS
1.0000 | ORAL_TABLET | Freq: Every day | ORAL | 0 refills | Status: DC
  Filled 2020-06-10: qty 30, 30d supply, fill #0
  Filled 2020-08-19: qty 30, 30d supply, fill #1

## 2020-06-10 MED FILL — Atorvastatin Calcium Tab 10 MG (Base Equivalent): ORAL | 30 days supply | Qty: 30 | Fill #0 | Status: AC

## 2020-06-10 NOTE — Telephone Encounter (Signed)
Requested Prescriptions  Pending Prescriptions Disp Refills  . triamterene-hydrochlorothiazide (MAXZIDE-25) 37.5-25 MG tablet 90 tablet 0    Sig: TAKE 1 TABLET BY MOUTH DAILY.     Cardiovascular: Diuretic Combos Passed - 06/10/2020 10:06 AM      Passed - K in normal range and within 360 days    Potassium  Date Value Ref Range Status  12/25/2019 4.4 3.5 - 5.2 mmol/L Final         Passed - Na in normal range and within 360 days    Sodium  Date Value Ref Range Status  12/25/2019 138 134 - 144 mmol/L Final         Passed - Cr in normal range and within 360 days    Creat  Date Value Ref Range Status  03/24/2015 0.70 0.50 - 1.05 mg/dL Final   Creatinine, Ser  Date Value Ref Range Status  12/25/2019 0.71 0.57 - 1.00 mg/dL Final         Passed - Ca in normal range and within 360 days    Calcium  Date Value Ref Range Status  12/25/2019 9.9 8.7 - 10.2 mg/dL Final   Calcium, Ion  Date Value Ref Range Status  04/30/2008 1.17 1.12 - 1.32 mmol/L Final         Passed - Last BP in normal range    BP Readings from Last 1 Encounters:  02/12/20 112/69         Passed - Valid encounter within last 6 months    Recent Outpatient Visits          5 months ago Prediabetes   [place], [place], [place]   1 year ago Essential hypertension   [facility] [place], [place], MD   1 year ago Essential hypertension   [facility] [place], [place], [place]   2 years ago Essential hypertension   [place], [place], MD   2 years ago Essential hypertension   [place], [place], [place]

## 2020-06-12 ENCOUNTER — Other Ambulatory Visit: Payer: Self-pay

## 2020-06-12 MED FILL — Atorvastatin Calcium Tab 10 MG (Base Equivalent): ORAL | 30 days supply | Qty: 30 | Fill #1 | Status: CN

## 2020-06-13 ENCOUNTER — Other Ambulatory Visit: Payer: Self-pay

## 2020-06-18 ENCOUNTER — Ambulatory Visit: Payer: Self-pay | Admitting: Family Medicine

## 2020-08-19 MED FILL — Atorvastatin Calcium Tab 10 MG (Base Equivalent): ORAL | 30 days supply | Qty: 30 | Fill #1 | Status: AC

## 2020-08-19 MED FILL — Metformin HCl Tab 500 MG: ORAL | 30 days supply | Qty: 60 | Fill #0 | Status: AC

## 2020-08-20 ENCOUNTER — Other Ambulatory Visit: Payer: Self-pay

## 2020-08-25 ENCOUNTER — Other Ambulatory Visit: Payer: Self-pay

## 2020-08-29 ENCOUNTER — Other Ambulatory Visit (HOSPITAL_COMMUNITY): Payer: Self-pay

## 2020-08-29 ENCOUNTER — Encounter (HOSPITAL_COMMUNITY): Payer: Self-pay

## 2020-08-29 ENCOUNTER — Ambulatory Visit (HOSPITAL_COMMUNITY)
Admission: EM | Admit: 2020-08-29 | Discharge: 2020-08-29 | Disposition: A | Discharge status: Home / Self Care | Payer: 59 | Attending: Medical Oncology | Admitting: Medical Oncology

## 2020-08-29 DIAGNOSIS — K2900 Acute gastritis without bleeding: Secondary | ICD-10-CM

## 2020-08-29 LAB — POCT URINALYSIS DIPSTICK, ED / UC
Bilirubin Urine: NEGATIVE
Glucose, UA: NEGATIVE mg/dL
Ketones, ur: NEGATIVE mg/dL
Leukocytes,Ua: NEGATIVE
Nitrite: NEGATIVE
Protein, ur: NEGATIVE mg/dL
Specific Gravity, Urine: 1.025 (ref 1.005–1.030)
Urobilinogen, UA: 0.2 mg/dL (ref 0.0–1.0)
pH: 5 (ref 5.0–8.0)

## 2020-08-29 MED ORDER — OMEPRAZOLE 20 MG PO CPDR
20.0000 mg | DELAYED_RELEASE_CAPSULE | Freq: Every day | ORAL | 0 refills | Status: DC
  Filled 2020-08-29: qty 14, 14d supply, fill #0

## 2020-08-29 MED ORDER — SUCRALFATE 1 G PO TABS
1.0000 g | ORAL_TABLET | Freq: Three times a day (TID) | ORAL | 0 refills | Status: DC
  Filled 2020-08-29: qty 45, 12d supply, fill #0

## 2020-08-29 NOTE — ED Provider Notes (Signed)
[place]    CSN: [id number] Arrival date & time: 08/29/20  1143      History   Chief Complaint Chief Complaint  Patient presents with   Abdominal Pain    HPI [name] is a 60 y.o. female.   HPI  Abdominal Pain: Pt reports that for the past month she has had some epigastric pain after eating. Worse after she eats tomato or carbonated products. No vomiting, nausea, diarrhea or constipation. No stool color changes. She has tried pepto without much relief.    Past Medical History:  Diagnosis Date   Bilateral carpal tunnel syndrome    Hypercholesteremia    Hypertension    OAB (overactive bladder)    Rheumatoid arthritis [facility])     Patient Active Problem List   Diagnosis Date Noted   Pain in left elbow 02/12/2020   Bilateral hand pain 02/12/2020   Rheumatoid factor positive 12/10/2016   Chronic arthralgias of knees and hips 12/10/2016   Patellofemoral arthritis 04/30/2015   Knee pain, chronic 04/02/2015   DIVERTICULAR DISEASE 11/26/2009   ALLERGIC RHINITIS 10/16/2009   RECTAL PAIN 10/16/2009   CONSTIPATION 03/11/2009   ANEMIA, IRON DEFICIENCY 02/21/2009   HYPERTENSION, BENIGN ESSENTIAL 02/19/2009   GERD 02/19/2009   VAGINAL PRURITUS 02/19/2009   FREQUENCY, URINARY 02/19/2009   MENOPAUSE, SURGICAL 03/08/2004    Past Surgical History:  Procedure Laterality Date   ABDOMINAL HYSTERECTOMY      OB History   No obstetric history on file.      Home Medications    Prior to Admission medications   Medication Sig Start Date End Date Taking? Authorizing Provider  atorvastatin (LIPITOR) 10 MG tablet TAKE 1 TABLET (10 MG TOTAL) BY MOUTH DAILY. TO LOWER CHOLESTEROL 11/29/19 11/28/20  Fulp, Ander Gaster, MD  diclofenac (VOLTAREN) 75 MG EC tablet Take 75 mg by mouth 2 (two) times daily.    [provider]  ibuprofen (ADVIL) 600 MG tablet Take 1 tablet (600 mg total) by mouth every 6 (six) hours as needed. Take after eating 06/14/19   Fulp, Cammie, MD   metFORMIN (GLUCOPHAGE) 500 MG tablet TAKE 1 TABLET (500 MG TOTAL) BY MOUTH 2 (TWO) TIMES DAILY WITH A MEAL. 05/29/20 05/29/21  Elsie Stain, MD  omeprazole (PRILOSEC) 20 MG capsule Take 1 capsule (20 mg total) by mouth 2 (two) times daily. To reduce stomach acid Patient not taking: Reported on 02/12/2020 12/21/19   Antony Blackbird, MD  predniSONE (DELTASONE) 20 MG tablet 2 pill today then one daily x 2 days and 1/2 daily x 2 days; take after eating Patient not taking: Reported on 12/21/2019 04/13/19   Antony Blackbird, MD  triamterene-hydrochlorothiazide (MAXZIDE-25) 37.5-25 MG tablet TAKE 1 TABLET BY MOUTH DAILY. 06/10/20 06/10/21  Charlott Rakes, MD    Family History Family History  Problem Relation Age of Onset   Hypertension Mother    Diabetes Mother    Cancer Father    Diabetes Brother     Social History Social History   Tobacco Use   Smoking status: Never   Smokeless tobacco: Never  Vaping Use   Vaping Use: Never used  Substance Use Topics   Alcohol use: Yes    Comment: Occasionally   Drug use: Not Currently    Frequency: 2.0 times per week    Types: Marijuana     Allergies   Patient has no known allergies.   Review of Systems Review of Systems  As stated above in HPI Physical Exam Triage  Vital Signs ED Triage Vitals  Enc Vitals Group     BP 08/29/20 1238 (!) 153/89     Pulse Rate 08/29/20 1238 79     Resp 08/29/20 1238 20     Temp 08/29/20 1238 99.1 F (37.3 C)     Temp Source 08/29/20 1238 Oral     SpO2 08/29/20 1238 99 %     Weight --      Height --      Head Circumference --      Peak Flow --      Pain Score 08/29/20 1236 7     Pain Loc --      Pain Edu? --      Excl. in [place]? --    No data found.  Updated Vital Signs BP (!) 153/89 (BP Location: Right Arm)   Pulse 79   Temp 99.1 F (37.3 C) (Oral)   Resp 20   SpO2 99%   Physical Exam Vitals and nursing note reviewed.  Constitutional:      General: She is not in acute distress.    Appearance:  She is well-developed. She is not ill-appearing, toxic-appearing or diaphoretic.  HENT:     Head: Normocephalic and atraumatic.  Cardiovascular:     Rate and Rhythm: Normal rate and regular rhythm.     Heart sounds: Normal heart sounds.  Pulmonary:     Effort: Pulmonary effort is normal.     Breath sounds: Normal breath sounds.  Abdominal:     General: Abdomen is flat. Bowel sounds are normal. There is no distension.     Palpations: Abdomen is soft. There is no shifting dullness, fluid wave, hepatomegaly, splenomegaly, mass or pulsatile mass.     Tenderness: There is no abdominal tenderness.     Hernia: No hernia is present.  Skin:    General: Skin is warm.     Coloration: Skin is not jaundiced.  Neurological:     Mental Status: She is alert and oriented to person, place, and time.     UC Treatments / Results  Labs (all labs ordered are listed, but only abnormal results are displayed) Labs Reviewed  POCT URINALYSIS DIPSTICK, ED / UC - Abnormal; Notable for the following components:      Result Value   Hgb urine dipstick TRACE (*)    All other components within normal limits  URINE CULTURE    EKG   Radiology No results found.  Procedures Procedures (including critical care time)  Medications Ordered in UC Medications - No data to display  Initial Impression / Assessment and Plan / UC Course  I have reviewed the triage vital signs and the nursing notes.  Pertinent labs & imaging results that were available during my care of the patient were reviewed by me and considered in my medical decision making (see chart for details).     New.  Discussed gastritis with patient.  Discussed ways to help avoid triggers.  For now treating with sucralfate and omeprazole.  Discussed how to use.  Discussed follow-up indications should symptoms fail to improve within 14 days. Final Clinical Impressions(s) / UC Diagnoses   Final diagnoses:  None   Discharge Instructions   None     ED Prescriptions   None    PDMP not reviewed this encounter.   Hughie Closs, [place] 08/29/20 1312

## 2020-08-29 NOTE — ED Triage Notes (Signed)
Pt presents with stomach pain x 1 month. She states if she eats she starts to feel an aching pain in her stomach. PT c/o feeling stomach pain after taking 2 sips of  a beer.

## 2020-08-30 LAB — URINE CULTURE: Culture: NO GROWTH

## 2020-09-10 ENCOUNTER — Other Ambulatory Visit: Payer: Self-pay

## 2020-09-10 ENCOUNTER — Encounter (HOSPITAL_COMMUNITY): Payer: Self-pay | Admitting: *Deleted

## 2020-09-10 ENCOUNTER — Ambulatory Visit (HOSPITAL_COMMUNITY)
Admission: EM | Admit: 2020-09-10 | Discharge: 2020-09-10 | Disposition: A | Discharge status: Home / Self Care | Payer: 59 | Attending: Student | Admitting: Student

## 2020-09-10 DIAGNOSIS — K4090 Unilateral inguinal hernia, without obstruction or gangrene, not specified as recurrent: Secondary | ICD-10-CM

## 2020-09-10 DIAGNOSIS — R1032 Left lower quadrant pain: Secondary | ICD-10-CM

## 2020-09-10 LAB — POCT URINALYSIS DIPSTICK, ED / UC
Bilirubin Urine: NEGATIVE
Glucose, UA: NEGATIVE mg/dL
Hgb urine dipstick: NEGATIVE
Ketones, ur: NEGATIVE mg/dL
Leukocytes,Ua: NEGATIVE
Nitrite: NEGATIVE
Protein, ur: NEGATIVE mg/dL
Specific Gravity, Urine: 1.015 (ref 1.005–1.030)
Urobilinogen, UA: 0.2 mg/dL (ref 0.0–1.0)
pH: 7 (ref 5.0–8.0)

## 2020-09-10 NOTE — Discharge Instructions (Addendum)
-  Please schedule an appointment with [facility] surgery for further evaluation and management of your suspected hernia. -You can schedule this appointment yourself, or you can reach out to your primary care for a referral. -The risk of having a hernia is that a loop of your bowel can get stuck in it.  This can cause severe abdominal pain and complications.  If this happens, like you start experiencing severe abdominal pain, constipation, vomiting-head straight to the emergency department.  This is not something that we can diagnose or treat in urgent care.

## 2020-09-10 NOTE — ED Provider Notes (Signed)
[name]    CSN: [id number] Arrival date & time: 09/10/20  0846      History   Chief Complaint No chief complaint on file.   HPI [name] is a 60 y.o. female presenting with groin pain x3 weeks. Medical history OAB, hypertension, RA.  She was recently at our urgent care for viral gastroenteritis, states this is improved.  States that the groin pain began before that, but she forgot to mention it at her last visit with [place].  Describes this as left-sided, throbbing, worse at night.  She has not identified any triggers, including eating, moving.  Endorses long history of constipation, states this is possibly getting worse.  Last bowel movement was 1 day ago and was normal, still passing gas.  Denies nausea or vomiting.  Denies urinary symptoms including frequency, urgency, hematuria, discharge.  HPI  Past Medical History:  Diagnosis Date   Bilateral carpal tunnel syndrome    Hypercholesteremia    Hypertension    OAB (overactive bladder)    Rheumatoid arthritis [facility])     Patient Active Problem List   Diagnosis Date Noted   Pain in left elbow 02/12/2020   Bilateral hand pain 02/12/2020   Rheumatoid factor positive 12/10/2016   Chronic arthralgias of knees and hips 12/10/2016   Patellofemoral arthritis 04/30/2015   Knee pain, chronic 04/02/2015   DIVERTICULAR DISEASE 11/26/2009   ALLERGIC RHINITIS 10/16/2009   RECTAL PAIN 10/16/2009   CONSTIPATION 03/11/2009   ANEMIA, IRON DEFICIENCY 02/21/2009   HYPERTENSION, BENIGN ESSENTIAL 02/19/2009   GERD 02/19/2009   VAGINAL PRURITUS 02/19/2009   FREQUENCY, URINARY 02/19/2009   MENOPAUSE, SURGICAL 03/08/2004    Past Surgical History:  Procedure Laterality Date   ABDOMINAL HYSTERECTOMY      OB History   No obstetric history on file.      Home Medications    Prior to Admission medications   Medication Sig Start Date End Date Taking? Authorizing Provider  atorvastatin (LIPITOR) 10 MG tablet TAKE 1 TABLET  (10 MG TOTAL) BY MOUTH DAILY. TO LOWER CHOLESTEROL 11/29/19 11/28/20  Fulp, Ander Gaster, MD  diclofenac (VOLTAREN) 75 MG EC tablet Take 75 mg by mouth 2 (two) times daily.    [provider]  ibuprofen (ADVIL) 600 MG tablet Take 1 tablet (600 mg total) by mouth every 6 (six) hours as needed. Take after eating 06/14/19   Fulp, Cammie, MD  metFORMIN (GLUCOPHAGE) 500 MG tablet TAKE 1 TABLET (500 MG TOTAL) BY MOUTH 2 (TWO) TIMES DAILY WITH A MEAL. 05/29/20 05/29/21  Elsie Stain, MD  omeprazole (PRILOSEC) 20 MG capsule Take 1 capsule (20 mg total) by mouth daily. 08/29/20   Hughie Closs, PA-C  predniSONE (DELTASONE) 20 MG tablet 2 pill today then one daily x 2 days and 1/2 daily x 2 days; take after eating Patient not taking: Reported on 12/21/2019 04/13/19   Fulp, Cammie, MD  sucralfate (CARAFATE) 1 g tablet Take 1 tablet (1 g total) by mouth 4 (four) times daily -  with meals and at bedtime. 08/29/20   Hughie Closs, PA-C  triamterene-hydrochlorothiazide (MAXZIDE-25) 37.5-25 MG tablet TAKE 1 TABLET BY MOUTH DAILY. 06/10/20 06/10/21  Charlott Rakes, MD    Family History Family History  Problem Relation Age of Onset   Hypertension Mother    Diabetes Mother    Cancer Father    Diabetes Brother     Social History Social History   Tobacco Use   Smoking status: Never  Smokeless tobacco: Never  Vaping Use   Vaping Use: Never used  Substance Use Topics   Alcohol use: Yes    Comment: Occasionally   Drug use: Not Currently    Frequency: 2.0 times per week    Types: Marijuana     Allergies   Patient has no known allergies.   Review of Systems Review of Systems  Constitutional:  Negative for appetite change, chills, diaphoresis and fever.  Respiratory:  Negative for shortness of breath.   Cardiovascular:  Negative for chest pain.  Gastrointestinal:  Negative for abdominal pain, blood in stool, constipation, diarrhea, nausea and vomiting.  Genitourinary:  Negative for  decreased urine volume, difficulty urinating, dysuria, flank pain, frequency, genital sores, hematuria, pelvic pain, urgency, vaginal bleeding, vaginal discharge and vaginal pain.       L groin pain  Musculoskeletal:  Negative for back pain.  Neurological:  Negative for dizziness, weakness and light-headedness.  All other systems reviewed and are negative.   Physical Exam Triage Vital Signs ED Triage Vitals  Enc Vitals Group     BP 09/10/20 0908 (!) 155/82     Pulse Rate 09/10/20 0908 82     Resp 09/10/20 0908 20     Temp 09/10/20 0908 98.9 F (37.2 C)     Temp src --      SpO2 09/10/20 0908 100 %     Weight --      Height --      Head Circumference --      Peak Flow --      Pain Score 09/10/20 0910 8     Pain Loc --      Pain Edu? --      Excl. in [place]? --    No data found.  Updated Vital Signs BP (!) 155/82   Pulse 82   Temp 98.9 F (37.2 C)   Resp 20   SpO2 100%   Visual Acuity Right Eye Distance:   Left Eye Distance:   Bilateral Distance:    Right Eye Near:   Left Eye Near:    Bilateral Near:     Physical Exam Vitals reviewed.  Constitutional:      General: She is not in acute distress.    Appearance: Normal appearance. She is not ill-appearing.  HENT:     Head: Normocephalic and atraumatic.     Mouth/Throat:     Mouth: Mucous membranes are moist.     Comments: Moist mucous membranes Eyes:     Extraocular Movements: Extraocular movements intact.     Pupils: Pupils are equal, round, and reactive to light.  Cardiovascular:     Rate and Rhythm: Normal rate and regular rhythm.     Heart sounds: Normal heart sounds.  Pulmonary:     Effort: Pulmonary effort is normal.     Breath sounds: Normal breath sounds. No wheezing, rhonchi or rales.  Abdominal:     General: Bowel sounds are normal. There is no distension.     Palpations: Abdomen is soft. There is no mass.     Tenderness: There is no abdominal tenderness. There is no right CVA tenderness, left CVA  tenderness, guarding or rebound.     Hernia: A hernia is present. Hernia is present in the left inguinal area.     Comments: BS positive throughout.  Genitourinary:    Comments: Palpable mass L inguinal canal. Reducible. Tender. Skin:    General: Skin is warm.     Capillary Refill:  Capillary refill takes less than 2 seconds.     Comments: Good skin turgor  Neurological:     General: No focal deficit present.     Mental Status: She is alert and oriented to person, place, and time.  Psychiatric:        Mood and Affect: Mood normal.        Behavior: Behavior normal.     UC Treatments / Results  Labs (all labs ordered are listed, but only abnormal results are displayed) Labs Reviewed  POCT URINALYSIS DIPSTICK, ED / UC    EKG   Radiology No results found.  Procedures Procedures (including critical care time)  Medications Ordered in UC Medications - No data to display  Initial Impression / Assessment and Plan / UC Course  I have reviewed the triage vital signs and the nursing notes.  Pertinent labs & imaging results that were available during my care of the patient were reviewed by me and considered in my medical decision making (see chart for details).     This patient is a very pleasant 60 y.o. year old female presenting with suspected L inguinal hernia. Reproducible. Passing regular bowel movements.   UA wnl. H/o hysterectomy.  Rec she f/u with surgery for further evaluation and management.  Information provided, or she can call her primary care for referral.  Patient verbalizes understanding.  Strict ED return precautions discussed. Patient verbalizes understanding and agreement.   Final Clinical Impressions(s) / UC Diagnoses   Final diagnoses:  Non-recurrent unilateral inguinal hernia without obstruction or gangrene  Left inguinal pain     Discharge Instructions      -Please schedule an appointment with [place] [facility] for further evaluation and  management of your suspected hernia. -You can schedule this appointment yourself, or you can reach out to your primary care for a referral. -The risk of having a hernia is that a loop of your bowel can get stuck in it.  This can cause severe abdominal pain and complications.  If this happens, like you start experiencing severe abdominal pain, constipation, vomiting-head straight to the emergency department.  This is not something that we can diagnose or treat in urgent care.     ED Prescriptions   None    PDMP not reviewed this encounter.   Hazel Sams, PA-C 09/10/20 1122

## 2020-09-10 NOTE — ED Triage Notes (Signed)
Pt reports Lt groin pain for 2 weeks

## 2020-09-20 ENCOUNTER — Other Ambulatory Visit: Payer: Self-pay

## 2020-09-20 ENCOUNTER — Emergency Department (HOSPITAL_COMMUNITY)
Admission: EM | Admit: 2020-09-20 | Discharge: 2020-09-20 | Disposition: A | Discharge status: Home / Self Care | Payer: 59 | Attending: Emergency Medicine | Admitting: Emergency Medicine

## 2020-09-20 ENCOUNTER — Encounter (HOSPITAL_COMMUNITY): Payer: Self-pay | Admitting: Emergency Medicine

## 2020-09-20 ENCOUNTER — Emergency Department (HOSPITAL_COMMUNITY): Payer: 59

## 2020-09-20 DIAGNOSIS — K760 Fatty (change of) liver, not elsewhere classified: Secondary | ICD-10-CM

## 2020-09-20 DIAGNOSIS — Z79899 Other long term (current) drug therapy: Secondary | ICD-10-CM | POA: Insufficient documentation

## 2020-09-20 DIAGNOSIS — K5732 Diverticulitis of large intestine without perforation or abscess without bleeding: Secondary | ICD-10-CM | POA: Insufficient documentation

## 2020-09-20 DIAGNOSIS — I10 Essential (primary) hypertension: Secondary | ICD-10-CM | POA: Insufficient documentation

## 2020-09-20 DIAGNOSIS — R1032 Left lower quadrant pain: Secondary | ICD-10-CM | POA: Diagnosis present

## 2020-09-20 DIAGNOSIS — K5792 Diverticulitis of intestine, part unspecified, without perforation or abscess without bleeding: Secondary | ICD-10-CM

## 2020-09-20 LAB — COMPREHENSIVE METABOLIC PANEL
ALT: 23 U/L (ref 0–44)
AST: 18 U/L (ref 15–41)
Albumin: 4.8 g/dL (ref 3.5–5.0)
Alkaline Phosphatase: 65 U/L (ref 38–126)
Anion gap: 11 (ref 5–15)
BUN: 10 mg/dL (ref 6–20)
CO2: 25 mmol/L (ref 22–32)
Calcium: 10.1 mg/dL (ref 8.9–10.3)
Chloride: 100 mmol/L (ref 98–111)
Creatinine, Ser: 0.69 mg/dL (ref 0.44–1.00)
GFR, Estimated: >60 mL/min (ref >60)
Glucose, Bld: 149 mg/dL — ABNORMAL HIGH (ref 70–99)
Potassium: 3.6 mmol/L (ref 3.5–5.1)
Sodium: 136 mmol/L (ref 135–145)
Total Bilirubin: 0.8 mg/dL (ref 0.3–1.2)
Total Protein: 8.4 g/dL — ABNORMAL HIGH (ref 6.5–8.1)

## 2020-09-20 LAB — CBC WITH DIFFERENTIAL/PLATELET
Abs Immature Granulocytes: 0.02 10*3/uL (ref 0.00–0.07)
Basophils Absolute: 0 10*3/uL (ref 0.0–0.1)
Basophils Relative: 0 %
Eosinophils Absolute: 0.1 10*3/uL (ref 0.0–0.5)
Eosinophils Relative: 2 %
HCT: 38.3 % (ref 36.0–46.0)
Hemoglobin: 13.1 g/dL (ref 12.0–15.0)
Immature Granulocytes: 0 %
Lymphocytes Relative: 18 %
Lymphs Abs: 1.4 10*3/uL (ref 0.7–4.0)
MCH: 30 pg (ref 26.0–34.0)
MCHC: 34.2 g/dL (ref 30.0–36.0)
MCV: 87.8 fL (ref 80.0–100.0)
Monocytes Absolute: 0.7 10*3/uL (ref 0.1–1.0)
Monocytes Relative: 9 %
Neutro Abs: 5.3 10*3/uL (ref 1.7–7.7)
Neutrophils Relative %: 71 %
Platelets: 242 10*3/uL (ref 150–400)
RBC: 4.36 MIL/uL (ref 3.87–5.11)
RDW: 13 % (ref 11.5–15.5)
WBC: 7.5 10*3/uL (ref 4.0–10.5)
nRBC: 0 % (ref 0.0–0.2)

## 2020-09-20 LAB — URINALYSIS, ROUTINE W REFLEX MICROSCOPIC
Bilirubin Urine: NEGATIVE
Glucose, UA: NEGATIVE mg/dL
Hgb urine dipstick: NEGATIVE
Ketones, ur: NEGATIVE mg/dL
Leukocytes,Ua: NEGATIVE
Nitrite: NEGATIVE
Protein, ur: NEGATIVE mg/dL
Specific Gravity, Urine: 1.013 (ref 1.005–1.030)
pH: 5 (ref 5.0–8.0)

## 2020-09-20 MED ORDER — ONDANSETRON HCL 4 MG PO TABS: 4.0000 mg | ORAL_TABLET | Freq: Four times a day (QID) | ORAL | 0 refills | Status: DC

## 2020-09-20 MED ORDER — ONDANSETRON HCL 4 MG/2ML IJ SOLN
4.0000 mg | Freq: Once | INTRAMUSCULAR | Status: AC
  Administered 2020-09-20: 4 mg via INTRAVENOUS
  Filled 2020-09-20: qty 2

## 2020-09-20 MED ORDER — MORPHINE SULFATE (PF) 4 MG/ML IV SOLN
4.0000 mg | Freq: Once | INTRAVENOUS | Status: AC
  Administered 2020-09-20: 4 mg via INTRAVENOUS
  Filled 2020-09-20: qty 1

## 2020-09-20 MED ORDER — HYDROCODONE-ACETAMINOPHEN 5-325 MG PO TABS: 1.0000 | ORAL_TABLET | Freq: Four times a day (QID) | ORAL | 0 refills | Status: DC | PRN

## 2020-09-20 MED ORDER — AMOXICILLIN-POT CLAVULANATE 875-125 MG PO TABS: 1.0000 | ORAL_TABLET | Freq: Two times a day (BID) | ORAL | 0 refills | Status: DC

## 2020-09-20 MED ORDER — IOHEXOL 300 MG/ML  SOLN
100.0000 mL | Freq: Once | INTRAMUSCULAR | Status: AC | PRN
  Administered 2020-09-20: 100 mL via INTRAVENOUS

## 2020-09-20 NOTE — Discharge Instructions (Addendum)
You were given a prescription for antibiotics. Please take the antibiotic prescription fully.   You were given a prescription for zofran to help with your nausea. Please take as directed.  Prescription given for Norco. Take medication as directed and do not operate machinery, drive a car, or work while taking this medication as it can make you drowsy.   Please follow up with your primary doctor within the next 5-7 days.  If you do not have a primary care provider, information for a healthcare clinic has been provided for you to make arrangements for follow up care. Please return to the ER sooner if you have any new or worsening symptoms, or if you have any of the following symptoms:  Abdominal pain that does not go away.  You have a fever.  You keep throwing up (vomiting).  The pain is felt only in portions of the abdomen. Pain in the right side could possibly be appendicitis. In an adult, pain in the left lower portion of the abdomen could be colitis or diverticulitis.  You pass bloody or black tarry stools.  There is bright red blood in the stool.  The constipation stays for more than 4 days.  There is belly (abdominal) or rectal pain.  You do not seem to be getting better.  You have any questions or concerns.

## 2020-09-20 NOTE — ED Notes (Signed)
Patient transported to CT 

## 2020-09-20 NOTE — ED Notes (Signed)
Pt verbalizes understanding of discharge instructions. Opportunity for questions and answers were provided. Pt discharged from the ED.   ?

## 2020-09-20 NOTE — ED Provider Notes (Signed)
[place] EMERGENCY DEPARTMENT Provider Note   CSN: [id number] Arrival date & time: 09/20/20  1150     History Chief Complaint  Patient presents with   Groin Pain    [name] is a 60 y.o. female with past medical significant for hypertension, hypercholesterolemia, RA who presents for evaluation of left inguinal bulge.  This began over 1 month ago.  Pain initially intermittent in nature however has become worse over the last week.  Initially seen by urgent care diagnosed with inguinal hernia.  She has called surgery and they are unable to get her in over the next 3 weeks.  States pain is worsened today.  She is passing flatus however did states she have to take stool softener a few days ago to help have a bowel movement.  No melena or blood per rectum.  She denies fever, chills, nausea, vomiting, chest pain, shortness of breath, dysuria, hematuria, weakness.  She denies any increased straining, lifting heavy objects.  She denies any pelvic pain, vaginal discharge or concerns for any STDs.  Rates her current pain a 9/10.  Denies additional aggravating or alleviating factors.  History obtained from patient and past medical records.  No interpreter used.  HPI     Past Medical History:  Diagnosis Date   Bilateral carpal tunnel syndrome    Hypercholesteremia    Hypertension    OAB (overactive bladder)    Rheumatoid arthritis [facility])     Patient Active Problem List   Diagnosis Date Noted   Pain in left elbow 02/12/2020   Bilateral hand pain 02/12/2020   Rheumatoid factor positive 12/10/2016   Chronic arthralgias of knees and hips 12/10/2016   Patellofemoral arthritis 04/30/2015   Knee pain, chronic 04/02/2015   DIVERTICULAR DISEASE 11/26/2009   ALLERGIC RHINITIS 10/16/2009   RECTAL PAIN 10/16/2009   CONSTIPATION 03/11/2009   ANEMIA, IRON DEFICIENCY 02/21/2009   HYPERTENSION, BENIGN ESSENTIAL 02/19/2009   GERD 02/19/2009   VAGINAL PRURITUS 02/19/2009    FREQUENCY, URINARY 02/19/2009   MENOPAUSE, SURGICAL 03/08/2004    Past Surgical History:  Procedure Laterality Date   ABDOMINAL HYSTERECTOMY       OB History   No obstetric history on file.     Family History  Problem Relation Age of Onset   Hypertension Mother    Diabetes Mother    Cancer Father    Diabetes Brother     Social History   Tobacco Use   Smoking status: Never   Smokeless tobacco: Never  Vaping Use   Vaping Use: Never used  Substance Use Topics   Alcohol use: Yes    Comment: Occasionally   Drug use: Not Currently    Frequency: 2.0 times per week    Types: Marijuana    Home Medications Prior to Admission medications   Medication Sig Start Date End Date Taking? Authorizing Provider  atorvastatin (LIPITOR) 10 MG tablet TAKE 1 TABLET (10 MG TOTAL) BY MOUTH DAILY. TO LOWER CHOLESTEROL 11/29/19 11/28/20  Fulp, Ander Gaster, MD  diclofenac (VOLTAREN) 75 MG EC tablet Take 75 mg by mouth 2 (two) times daily.    [provider]  ibuprofen (ADVIL) 600 MG tablet Take 1 tablet (600 mg total) by mouth every 6 (six) hours as needed. Take after eating 06/14/19   Fulp, Cammie, MD  metFORMIN (GLUCOPHAGE) 500 MG tablet TAKE 1 TABLET (500 MG TOTAL) BY MOUTH 2 (TWO) TIMES DAILY WITH A MEAL. 05/29/20 05/29/21  Elsie Stain, MD  omeprazole ([place])  20 MG capsule Take 1 capsule (20 mg total) by mouth daily. 08/29/20   Hughie Closs, PA-C  predniSONE (DELTASONE) 20 MG tablet 2 pill today then one daily x 2 days and 1/2 daily x 2 days; take after eating Patient not taking: Reported on 12/21/2019 04/13/19   Fulp, Cammie, MD  sucralfate (CARAFATE) 1 g tablet Take 1 tablet (1 g total) by mouth 4 (four) times daily -  with meals and at bedtime. 08/29/20   Hughie Closs, PA-C  triamterene-hydrochlorothiazide (MAXZIDE-25) 37.5-25 MG tablet TAKE 1 TABLET BY MOUTH DAILY. 06/10/20 06/10/21  Charlott Rakes, MD    Allergies    Patient has no known allergies.  Review of Systems    Review of Systems  Constitutional: Negative.   HENT: Negative.    Respiratory: Negative.    Cardiovascular: Negative.   Gastrointestinal: Negative.        Left inguinal pain ans buldge  Genitourinary: Negative.   Musculoskeletal: Negative.   Skin: Negative.   Neurological: Negative.   All other systems reviewed and are negative.  Physical Exam Updated Vital Signs BP (!) 166/88   Pulse 91   Temp 99.6 F (37.6 C) (Oral)   Resp 16   SpO2 98%   Physical Exam Vitals and nursing note reviewed.  Constitutional:      General: She is not in acute distress.    Appearance: She is well-developed. She is not ill-appearing, toxic-appearing or diaphoretic.  HENT:     Head: Normocephalic and atraumatic.     Nose: Nose normal.     Mouth/Throat:     Mouth: Mucous membranes are moist.  Eyes:     Pupils: Pupils are equal, round, and reactive to light.  Cardiovascular:     Rate and Rhythm: Normal rate.     Pulses: Normal pulses.     Heart sounds: Normal heart sounds.  Pulmonary:     Effort: Pulmonary effort is normal. No respiratory distress.     Breath sounds: Normal breath sounds.     Comments: Clear to auscultation bilaterally.  Speaks in full sentences without difficulty Abdominal:     General: Bowel sounds are normal. There is no distension.     Palpations: Abdomen is soft.     Tenderness: There is abdominal tenderness. There is no right CVA tenderness, left CVA tenderness, guarding or rebound.       Comments: Tenderness to left inguinal area with soft tissue bulge.    Musculoskeletal:        General: Normal range of motion.     Cervical back: Normal range of motion.     Comments: No bony tenderness.  Moves all 4 extremities without difficulty. No pain with ROM to left hip. No midline tenderness   Skin:    General: Skin is warm and dry.     Capillary Refill: Capillary refill takes less than 2 seconds.     Comments: No erythema or warmth.  No fluctuance or induration   Neurological:     General: No focal deficit present.     Mental Status: She is alert.  Psychiatric:        Mood and Affect: Mood normal.    ED Results / Procedures / Treatments   Labs (all labs ordered are listed, but only abnormal results are displayed) Labs Reviewed - No data to display  EKG None  Radiology No results found.  Procedures Procedures   Medications Ordered in ED Medications  morphine 4 MG/ML injection 4  mg (has no administration in time range)  ondansetron (ZOFRAN) injection 4 mg (has no administration in time range)    ED Course  I have reviewed the triage vital signs and the nursing notes.  Pertinent labs & imaging results that were available during my care of the patient were reviewed by me and considered in my medical decision making (see chart for details).  Here for evaluation of left inguinal pain which is been going on for quite some time however worsened over the last week.  She is afebrile, nonseptic, not ill-appearing.  Her heart and lungs are clear.  Her abdomen is diffusely tender to her left inguinal, left lower quadrant.  She has had to take some stool softeners to help with bowel movements however denies any melena or bright blood per rectum.  No urinary complaints.  She has a neuromusculoskeletal exam.  She is neurovascularly intact.  No overlying skin changes to suggest abscess or cellulitis on exam.  We will plan on labs, imaging  CBC without leukocytosis  Care transferred to oncoming provider who will follow up on remaining labs and imaging and determine appropriate disposition.    MDM Rules/Calculators/A&P                           Final Clinical Impression(s) / ED Diagnoses Final diagnoses:  None    Rx / DC Orders ED Discharge Orders     None        Cailan Antonucci A, PA-C 09/20/20 1542    Carmin Muskrat, MD 09/20/20 1626

## 2020-09-20 NOTE — ED Triage Notes (Signed)
Patient from home. Complaint of groin pain for several weeks, pain getting worse, has an appointment with dr but states pain to much today.

## 2020-09-20 NOTE — ED Provider Notes (Signed)
Care assumed from [facility], PA-C  See her note for full H&P.   Per her note, " [name] is a 60 y.o. female with past medical significant for hypertension, hypercholesterolemia, RA who presents for evaluation of left inguinal bulge.  This began over 1 month ago.  Pain initially intermittent in nature however has become worse over the last week.  Initially seen by urgent care diagnosed with inguinal hernia.  She has called surgery and they are unable to get her in over the next 3 weeks.  States pain is worsened today.  She is passing flatus however did states she have to take stool softener a few days ago to help have a bowel movement.  No melena or blood per rectum.  She denies fever, chills, nausea, vomiting, chest pain, shortness of breath, dysuria, hematuria, weakness.  She denies any increased straining, lifting heavy objects.  She denies any pelvic pain, vaginal discharge or concerns for any STDs.  Rates her current pain a 9/10.  Denies additional aggravating or alleviating factors.   History obtained from patient and past medical records.  No interpreter used. "  Physical Exam  BP 100/65   Pulse 93   Temp 99.6 F (37.6 C) (Oral)   Resp 16   SpO2 98%   Physical Exam Vitals and nursing note reviewed.  Constitutional:      General: She is not in acute distress.    Appearance: She is well-developed.     Comments: Tolerating pos  HENT:     Head: Normocephalic and atraumatic.  Eyes:     Conjunctiva/sclera: Conjunctivae normal.  Cardiovascular:     Rate and Rhythm: Normal rate.  Pulmonary:     Effort: Pulmonary effort is normal.  Abdominal:     Palpations: Abdomen is soft.     Tenderness: There is abdominal tenderness (LLQ).  Musculoskeletal:        General: Normal range of motion.     Cervical back: Neck supple.  Skin:    General: Skin is warm and dry.  Neurological:     Mental Status: She is alert.    ED Course/Procedures     Procedures  Results for orders  placed or performed during the hospital encounter of 09/20/20  CBC with Differential  Result Value Ref Range   WBC 7.5 4.0 - 10.5 K/uL   RBC 4.36 3.87 - 5.11 MIL/uL   Hemoglobin 13.1 12.0 - 15.0 g/dL   HCT 38.3 36.0 - 46.0 %   MCV 87.8 80.0 - 100.0 fL   MCH 30.0 26.0 - 34.0 pg   MCHC 34.2 30.0 - 36.0 g/dL   RDW 13.0 11.5 - 15.5 %   Platelets 242 150 - 400 K/uL   nRBC 0.0 0.0 - 0.2 %   Neutrophils Relative % 71 %   Neutro Abs 5.3 1.7 - 7.7 K/uL   Lymphocytes Relative 18 %   Lymphs Abs 1.4 0.7 - 4.0 K/uL   Monocytes Relative 9 %   Monocytes Absolute 0.7 0.1 - 1.0 K/uL   Eosinophils Relative 2 %   Eosinophils Absolute 0.1 0.0 - 0.5 K/uL   Basophils Relative 0 %   Basophils Absolute 0.0 0.0 - 0.1 K/uL   Immature Granulocytes 0 %   Abs Immature Granulocytes 0.02 0.00 - 0.07 K/uL  Comprehensive metabolic panel  Result Value Ref Range   Sodium 136 135 - 145 mmol/L   Potassium 3.6 3.5 - 5.1 mmol/L   Chloride 100 98 - 111  mmol/L   CO2 25 22 - 32 mmol/L   Glucose, Bld 149 (H) 70 - 99 mg/dL   BUN 10 6 - 20 mg/dL   Creatinine, Ser 0.69 0.44 - 1.00 mg/dL   Calcium 10.1 8.9 - 10.3 mg/dL   Total Protein 8.4 (H) 6.5 - 8.1 g/dL   Albumin 4.8 3.5 - 5.0 g/dL   AST 18 15 - 41 U/L   ALT 23 0 - 44 U/L   Alkaline Phosphatase 65 38 - 126 U/L   Total Bilirubin 0.8 0.3 - 1.2 mg/dL   GFR, Estimated >60 >60 mL/min   Anion gap 11 5 - 15  Urinalysis, Routine w reflex microscopic Urine, Clean Catch  Result Value Ref Range   Color, Urine YELLOW YELLOW   APPearance CLEAR CLEAR   Specific Gravity, Urine 1.013 1.005 - 1.030   pH 5.0 5.0 - 8.0   Glucose, UA NEGATIVE NEGATIVE mg/dL   Hgb urine dipstick NEGATIVE NEGATIVE   Bilirubin Urine NEGATIVE NEGATIVE   Ketones, ur NEGATIVE NEGATIVE mg/dL   Protein, ur NEGATIVE NEGATIVE mg/dL   Nitrite NEGATIVE NEGATIVE   Leukocytes,Ua NEGATIVE NEGATIVE   CT Abdomen Pelvis W Contrast  Result Date: 09/20/2020 CLINICAL DATA:  Patient from home. Complaint of  groin pain for several weeks, pain getting worse, has an appointment with dr but states pain to much today. EXAM: CT ABDOMEN AND PELVIS WITH CONTRAST TECHNIQUE: Multidetector CT imaging of the abdomen and pelvis was performed using the standard protocol following bolus administration of intravenous contrast. CONTRAST:  14mL OMNIPAQUE IOHEXOL 300 MG/ML  SOLN COMPARISON:  05/06/2008 FINDINGS: Lower chest: Clear lung bases. Hepatobiliary: Liver normal in size. Decreased liver attenuation consistent with fatty infiltration. No liver mass or focal lesion. Normal gallbladder. No bile duct dilation. Pancreas: Unremarkable. No pancreatic ductal dilatation or surrounding inflammatory changes. Spleen: Cyst from the posteromedial margin of the spleen, 2.5 cm, enlarged compared to the prior CT. No other splenic masses or lesions. Spleen normal in size. Adrenals/Urinary Tract: No adrenal masses. 2 cm cyst, upper pole of the left kidney. No other renal masses, no stones and no hydronephrosis. Normal ureters. Normal bladder. Stomach/Bowel: Inflammatory changes lie adjacent to the proximal sigmoid colon centered on an ill-defined diverticulum. There is no extraluminal air. There is no fluid collection to suggest an abscess. There are additional diverticula along the left colon with no other inflammatory changes. Remainder of the colon is unremarkable. Normal stomach and small bowel. Normal appendix visualized. Vascular/Lymphatic: No significant vascular findings are present. No enlarged abdominal or pelvic lymph nodes. Reproductive: Status post hysterectomy. No adnexal masses. Other: No abdominal wall hernia or abnormality. No abdominopelvic ascites. Musculoskeletal: No fracture or acute finding.  No bone lesion. IMPRESSION: 1. Acute uncomplicated sigmoid colon diverticulitis. No abscess. No extraluminal or free air. 2. No other acute abnormality. 3. Hepatic steatosis. Electronically Signed   By: Lajean Manes M.D.   On:  09/20/2020 16:51     MDM   60 y/o F here for left inguinal pain.   Reviewed/interpreted labs  CBC unremarkable CMP unremarkable UA neg  Reviewed/interpreted imaging CT abd/pelvis - 1. Acute uncomplicated sigmoid colon diverticulitis. No abscess. No extraluminal or free air. 2. No other acute abnormality. 3. Hepatic steatosis.  Pt with diverticulitis. She is well appearing, nontoxic and not systemically ill. She is tolerating pos and on reassessment is comfortable appearing and in no distress. Will send home with rx for abx, pain meds and zofran. Will give gi f/u. Advised on  return precautions. She voices understanding and is in agreement with plan. All questions answered, pt stable for discharge.       Bishop Dublin 09/20/20 1711    Truddie Hidden, MD 09/20/20 2248

## 2020-09-25 ENCOUNTER — Encounter: Payer: Self-pay | Admitting: Physician Assistant

## 2020-09-25 ENCOUNTER — Other Ambulatory Visit: Payer: Self-pay

## 2020-09-25 ENCOUNTER — Ambulatory Visit: Payer: 59 | Attending: Family Medicine | Admitting: Physician Assistant

## 2020-09-25 VITALS — BP 146/97 | HR 92 | Resp 16 | Wt 213.0 lb

## 2020-09-25 DIAGNOSIS — Z09 Encounter for follow-up examination after completed treatment for conditions other than malignant neoplasm: Secondary | ICD-10-CM

## 2020-09-25 DIAGNOSIS — R0982 Postnasal drip: Secondary | ICD-10-CM | POA: Diagnosis not present

## 2020-09-25 DIAGNOSIS — R7303 Prediabetes: Secondary | ICD-10-CM | POA: Diagnosis not present

## 2020-09-25 DIAGNOSIS — E785 Hyperlipidemia, unspecified: Secondary | ICD-10-CM

## 2020-09-25 DIAGNOSIS — K5792 Diverticulitis of intestine, part unspecified, without perforation or abscess without bleeding: Secondary | ICD-10-CM

## 2020-09-25 DIAGNOSIS — I10 Essential (primary) hypertension: Secondary | ICD-10-CM

## 2020-09-25 MED ORDER — METFORMIN HCL 500 MG PO TABS
ORAL_TABLET | Freq: Two times a day (BID) | ORAL | 3 refills | Status: DC
  Filled 2020-09-25: qty 60, 30d supply, fill #0

## 2020-09-25 MED ORDER — ATORVASTATIN CALCIUM 10 MG PO TABS
ORAL_TABLET | ORAL | 1 refills | Status: DC
  Filled 2020-09-25 – 2021-03-11 (×2): qty 90, 90d supply, fill #0
  Filled 2021-03-11: qty 90, 90d supply, fill #1

## 2020-09-25 MED ORDER — TRIAMTERENE-HCTZ 37.5-25 MG PO TABS
1.0000 | ORAL_TABLET | Freq: Every day | ORAL | 1 refills | Status: DC
  Filled 2020-09-25: qty 90, 90d supply, fill #0
  Filled 2021-03-11: qty 90, 90d supply, fill #1
  Filled 2021-03-11: qty 90, 90d supply, fill #0

## 2020-09-25 MED ORDER — FLUTICASONE PROPIONATE 50 MCG/ACT NA SUSP
2.0000 | Freq: Every day | NASAL | 6 refills | Status: DC
  Filled 2020-09-25: qty 16, 30d supply, fill #0

## 2020-09-25 NOTE — Progress Notes (Signed)
Patient ID: [name], female   DOB: May 12, 1960, 60 y.o.   MRN: [id number]    [name], is a 60 y.o. female  QBH:[id number]  OXB:[id number]  DOB - November 22, 1960  Subjective:  Chief Complaint and HPI: [name] is a 60 y.o. female here today for a follow up visit  After ED visit 09/20/2020 and found to have acute diverticulitis-treated with Augmentin.  See note below.  Abdominal pain has resolved.  BMs normal  she still has a few days left of the meds.  She is having some post nasal drip.  No ST/Cough.    From ED A/P: Reviewed/interpreted labs CBC unremarkable CMP unremarkable UA neg   Reviewed/interpreted imaging CT abd/pelvis - 1. Acute uncomplicated sigmoid colon diverticulitis. No abscess. No extraluminal or free air. 2. No other acute abnormality. 3. Hepatic steatosis.   Pt with diverticulitis. She is well appearing, nontoxic and not systemically ill. She is tolerating pos and on reassessment is comfortable appearing and in no distress. Will send home with rx for abx, pain meds and zofran. Will give gi f/u. Advised on return precautions. She voices understanding and is in agreement with plan. All questions answered, pt stable for discharge.  ED/Hospital notes reviewed.    ROS:   Constitutional:  No f/c, No night sweats, No unexplained weight loss. EENT:  No vision changes, No blurry vision, No hearing changes. Respiratory: No cough, No SOB Cardiac: No CP, no palpitations GI:  No abd pain, No N/V/D. GU: No Urinary s/sx Musculoskeletal: No joint pain Neuro: No headache, no dizziness, no motor weakness.  Skin: No rash Endocrine:  No polydipsia. No polyuria.  Psych: Denies SI/HI  No problems updated.  ALLERGIES: No Known Allergies  PAST MEDICAL HISTORY: Past Medical History:  Diagnosis Date   Bilateral carpal tunnel syndrome    Hypercholesteremia    Hypertension    OAB (overactive bladder)    Rheumatoid arthritis ([place])     MEDICATIONS AT  HOME: Prior to Admission medications   Medication Sig Start Date End Date Taking? Authorizing Provider  fluticasone (FLONASE) 50 MCG/ACT nasal spray Place 2 sprays into both nostrils daily. 09/25/20  Yes Argentina Donovan, PA-C  amoxicillin-clavulanate (AUGMENTIN) 875-125 MG tablet Take 1 tablet by mouth every 12 (twelve) hours. 09/20/20   Couture, Cortni S, PA-C  atorvastatin (LIPITOR) 10 MG tablet TAKE 1 TABLET (10 MG TOTAL) BY MOUTH DAILY. TO LOWER CHOLESTEROL 09/25/20 09/25/21  Argentina Donovan, PA-C  diclofenac (VOLTAREN) 75 MG EC tablet Take 75 mg by mouth 2 (two) times daily.    [provider]  HYDROcodone-acetaminophen (NORCO/VICODIN) 5-325 MG tablet Take 1 tablet by mouth every 6 (six) hours as needed. 09/20/20   Couture, Cortni S, PA-C  ibuprofen (ADVIL) 600 MG tablet Take 1 tablet (600 mg total) by mouth every 6 (six) hours as needed. Take after eating 06/14/19   Fulp, Cammie, MD  metFORMIN (GLUCOPHAGE) 500 MG tablet TAKE 1 TABLET (500 MG TOTAL) BY MOUTH 2 (TWO) TIMES DAILY WITH A MEAL. 09/25/20 09/25/21  Argentina Donovan, PA-C  omeprazole (PRILOSEC) 20 MG capsule Take 1 capsule (20 mg total) by mouth daily. 08/29/20   Hughie Closs, PA-C  ondansetron (ZOFRAN) 4 MG tablet Take 1 tablet (4 mg total) by mouth every 6 (six) hours. 09/20/20   Couture, Cortni S, PA-C  predniSONE (DELTASONE) 20 MG tablet 2 pill today then one daily x 2 days and 1/2 daily x 2 days; take after eating Patient not taking: Reported  on 12/21/2019 04/13/19   Fulp, Cammie, MD  sucralfate (CARAFATE) 1 g tablet Take 1 tablet (1 g total) by mouth 4 (four) times daily -  with meals and at bedtime. 08/29/20   Hughie Closs, PA-C  triamterene-hydrochlorothiazide (MAXZIDE-25) 37.5-25 MG tablet TAKE 1 TABLET BY MOUTH DAILY. 09/25/20 09/25/21  Argentina Donovan, PA-C     Objective:  EXAM:   Vitals:   09/25/20 1528  BP: (!) 146/97  Pulse: 92  Resp: 16  SpO2: 95%  Weight: 213 lb (96.6 kg)    General appearance  : A&OX3. NAD. Non-toxic-appearing HEENT: Atraumatic and Normocephalic.  PERRLA. EOM intact.  +PND. Neck: supple, no JVD. No cervical lymphadenopathy. No thyromegaly Chest/Lungs:  Breathing-non-labored, Good air entry bilaterally, breath sounds normal without rales, rhonchi, or wheezing  CVS: S1 S2 regular, no murmurs, gallops, rubs  Abdomen: Bowel sounds present, Non tender and not distended with no gaurding, rigidity or rebound. Extremities: Bilateral Lower Ext shows no edema, both legs are warm to touch with = pulse throughout Neurology:  CN II-XII grossly intact, Non focal.   Psych:  TP scattered. J/I WNL. Normal speech with slight impediment. Appropriate eye contact and affect.  Skin:  No Rash  Data Review Lab Results  Component Value Date   HGBA1C 6.0 (A) 12/21/2019   HGBA1C 6.3 04/13/2019   HGBA1C 6.2 (H) 03/24/2015     Assessment & Plan   1. Essential hypertension Was out of med-resume meds - triamterene-hydrochlorothiazide (MAXZIDE-25) 37.5-25 MG tablet; TAKE 1 TABLET BY MOUTH DAILY.  Dispense: 90 tablet; Refill: 1  2. Prediabetes I have had a lengthy discussion and provided education about insulin resistance and the intake of too much sugar/refined carbohydrates.  I have advised the patient to work at a goal of eliminating sugary drinks, candy, desserts, sweets, refined sugars, processed foods, and white carbohydrates.  The patient expresses understanding.   - metFORMIN (GLUCOPHAGE) 500 MG tablet; TAKE 1 TABLET (500 MG TOTAL) BY MOUTH 2 (TWO) TIMES DAILY WITH A MEAL.  Dispense: 60 tablet; Refill: 3 - Hemoglobin A1c  3. Hyperlipidemia, unspecified hyperlipidemia type - atorvastatin (LIPITOR) 10 MG tablet; TAKE 1 TABLET (10 MG TOTAL) BY MOUTH DAILY. TO LOWER CHOLESTEROL  Dispense: 90 tablet; Refill: 1  4. Post-nasal drip - fluticasone (FLONASE) 50 MCG/ACT nasal spray; Place 2 sprays into both nostrils daily.  Dispense: 16 g; Refill: 6  5. Encounter for examination  following treatment at hospital Doing well  6. Diverticulitis Labs reviewed and ok at ED other than mild hyperglycemia   Patient have been counseled extensively about nutrition and exercise  Return for 3-4 months with PCP for chronic conditions.  The patient was given clear instructions to go to ER or return to medical center if symptoms don't improve, worsen or new problems develop. The patient verbalized understanding. The patient was told to call to get lab results if they haven't heard anything in the next week.     Freeman Caldron, PA-C [facility] [place]   09/25/2020, 3:48 PM

## 2020-09-25 NOTE — Progress Notes (Signed)
Pt states she be having pain in right leg at night   Pt states in her throat she keeps a itch and cough  Pt states she has some concerns regarding the veins in her arm

## 2020-09-26 ENCOUNTER — Encounter: Payer: Self-pay | Admitting: Gastroenterology

## 2020-09-26 LAB — HEMOGLOBIN A1C
Est. average glucose Bld gHb Est-mCnc: 151 mg/dL
Hgb A1c MFr Bld: 6.9 % — ABNORMAL HIGH (ref 4.8–5.6)

## 2020-10-01 ENCOUNTER — Other Ambulatory Visit: Payer: Self-pay

## 2020-10-01 ENCOUNTER — Other Ambulatory Visit: Payer: Self-pay | Admitting: Physician Assistant

## 2020-10-01 DIAGNOSIS — E1165 Type 2 diabetes mellitus with hyperglycemia: Secondary | ICD-10-CM

## 2020-10-01 MED ORDER — METFORMIN HCL 500 MG PO TABS
1000.0000 mg | ORAL_TABLET | Freq: Two times a day (BID) | ORAL | 3 refills | Status: DC
  Filled 2020-10-01 – 2021-02-11 (×2): qty 120, 30d supply, fill #0

## 2020-10-31 ENCOUNTER — Other Ambulatory Visit (HOSPITAL_COMMUNITY): Payer: Self-pay

## 2020-10-31 ENCOUNTER — Ambulatory Visit (INDEPENDENT_AMBULATORY_CARE_PROVIDER_SITE_OTHER): Payer: 59 | Admitting: Gastroenterology

## 2020-10-31 ENCOUNTER — Encounter: Payer: Self-pay | Admitting: Gastroenterology

## 2020-10-31 VITALS — BP 140/80 | HR 88 | Ht 71.0 in | Wt 209.0 lb

## 2020-10-31 DIAGNOSIS — K5732 Diverticulitis of large intestine without perforation or abscess without bleeding: Secondary | ICD-10-CM | POA: Diagnosis not present

## 2020-10-31 DIAGNOSIS — K5909 Other constipation: Secondary | ICD-10-CM

## 2020-10-31 MED ORDER — PEG 3350-KCL-NA BICARB-NACL 420 G PO SOLR
4000.0000 mL | Freq: Once | ORAL | 0 refills | Status: AC
  Filled 2020-10-31: qty 4000, 1d supply, fill #0

## 2020-10-31 MED ORDER — AMOXICILLIN-POT CLAVULANATE 875-125 MG PO TABS
1.0000 | ORAL_TABLET | Freq: Two times a day (BID) | ORAL | 0 refills | Status: DC
  Filled 2020-10-31: qty 14, 7d supply, fill #0

## 2020-10-31 NOTE — Patient Instructions (Signed)
If you are age 59 or older, your body mass index should be between 23-30. Your Body mass index is 29.15 kg/m. If this is out of the aforementioned range listed, please consider follow up with your Primary Care Provider.  If you are age 3 or younger, your body mass index should be between 19-25. Your Body mass index is 29.15 kg/m. If this is out of the aformentioned range listed, please consider follow up with your Primary Care Provider.   __________________________________________________________  The [place] [facility] providers would like to encourage you to use [facility] to communicate with providers for non-urgent requests or questions.  Due to long hold times on the telephone, sending your provider a message by [facility] may be a faster and more efficient way to get a response.  Please allow 48 business hours for a response.  Please remember that this is for non-urgent requests.   You have been scheduled for a colonoscopy. Please follow written instructions given to you at your visit today.  Please pick up your prep supplies at the pharmacy within the next 1-3 days. If you use inhalers (even only as needed), please bring them with you on the day of your procedure.  Please Start a capful of Miralax daily.   It was a pleasure to see you today!  Thank you for trusting me with your gastrointestinal care!

## 2020-10-31 NOTE — Progress Notes (Signed)
[facility] Consult Note:  History: [name] 10/31/2020  Referring provider: [facility] PCP: Antony Blackbird, MD ([facility])  Reason for consult/chief complaint: lower left side abd pain x 2 months  (ED f/u)   Subjective  HPI: [name] was referred after an ED visit on 09/20/2020, when she had a week of escalating lower abdominal pain centered more toward the left side.  CT scan revealed sigmoid diverticulitis, and she was treated with 7 days of Augmentin 875/125 mg twice daily.  [name] says she had pain at least a week prior to that, it was in the left lower quadrant and groin.  It improved significantly with the week of Augmentin, but she still has occasional sharp or crampy pain and points to the inguinal region as location.  She has struggled with constipation for years, that makes her feel bloated and sometimes gives her a lower back pressure if she is unable to have a BM.  She has not been taking any stool softeners or laxatives on a regular basis. Denies nausea or vomiting, dysphagia early satiety or weight loss.  Occasional heartburn or regurgitation Denies rectal bleeding.  ROS:  Review of Systems  Constitutional:  Negative for appetite change and unexpected weight change.  HENT:  Negative for mouth sores and voice change.   Eyes:  Negative for pain and redness.  Respiratory:  Negative for cough and shortness of breath.   Cardiovascular:  Negative for chest pain and palpitations.  Genitourinary:  Negative for dysuria and hematuria.  Musculoskeletal:  Positive for arthralgias. Negative for myalgias.  Skin:  Negative for pallor and rash.  Allergic/Immunologic: Positive for environmental allergies.  Neurological:  Negative for weakness and headaches.  Hematological:  Negative for adenopathy.    Past Medical History: Past Medical History:  Diagnosis Date   Bilateral carpal tunnel syndrome    Hypercholesteremia     Hypertension    Hypertension    OAB (overactive bladder)    Rheumatoid arthritis ([place])      Past Surgical History: Past Surgical History:  Procedure Laterality Date   ABDOMINAL HYSTERECTOMY       Family History: Family History  Problem Relation Age of Onset   Hypertension Mother    Diabetes Mother    Cancer Father    Diabetes Brother     Social History: Social History   Socioeconomic History   Marital status: Single    Spouse name: Not on file   Number of children: 2   Years of education: Not on file   Highest education level: 11th grade  Occupational History   Not on file  Tobacco Use   Smoking status: Never   Smokeless tobacco: Never  Vaping Use   Vaping Use: Never used  Substance and Sexual Activity   Alcohol use: Yes    Alcohol/week: 2.0 standard drinks    Types: 2 Cans of beer per week    Comment: Occasionally   Drug use: Not Currently    Frequency: 2.0 times per week    Types: Marijuana   Sexual activity: Not Currently    Birth control/protection: Surgical  Other Topics Concern   Not on file  Social History Narrative   Not on file   Social Determinants of Health   Financial Resource Strain: Not on file  Food Insecurity: Not on file  Transportation Needs: Not on file  Physical Activity: Not on file  Stress: Not on file  Social Connections: Not on file  Works at [employer]: No Known Allergies  Outpatient Meds: Current Outpatient Medications  Medication Sig Dispense Refill   atorvastatin (LIPITOR) 10 MG tablet TAKE 1 TABLET (10 MG TOTAL) BY MOUTH DAILY. TO LOWER CHOLESTEROL 90 tablet 1   fluticasone (FLONASE) 50 MCG/ACT nasal spray Place 2 sprays into both nostrils daily. 16 g 6   metFORMIN (GLUCOPHAGE) 500 MG tablet Take 2 tablets (1,000 mg total) by mouth 2 (two) times daily with a meal. 120 tablet 3   omeprazole (PRILOSEC) 20 MG capsule Take 1 capsule (20 mg total) by mouth daily. 14 capsule 0   ondansetron (ZOFRAN) 4 MG  tablet Take 1 tablet (4 mg total) by mouth every 6 (six) hours. 12 tablet 0   predniSONE (DELTASONE) 20 MG tablet 2 pill today then one daily x 2 days and 1/2 daily x 2 days; take after eating 5 tablet 0   triamterene-hydrochlorothiazide (MAXZIDE-25) 37.5-25 MG tablet TAKE 1 TABLET BY MOUTH DAILY. 90 tablet 1   amoxicillin-clavulanate (AUGMENTIN) 875-125 MG tablet Take 1 tablet by mouth every 12 (twelve) hours. (Patient not taking: Reported on 10/31/2020) 14 tablet 0   diclofenac (VOLTAREN) 75 MG EC tablet Take 75 mg by mouth 2 (two) times daily. (Patient not taking: Reported on 10/31/2020)     HYDROcodone-acetaminophen (NORCO/VICODIN) 5-325 MG tablet Take 1 tablet by mouth every 6 (six) hours as needed. (Patient not taking: Reported on 10/31/2020) 6 tablet 0   ibuprofen (ADVIL) 600 MG tablet Take 1 tablet (600 mg total) by mouth every 6 (six) hours as needed. Take after eating (Patient not taking: Reported on 10/31/2020) 60 tablet 3   sucralfate (CARAFATE) 1 g tablet Take 1 tablet (1 g total) by mouth 4 (four) times daily -  with meals and at bedtime. (Patient not taking: Reported on 10/31/2020) 45 tablet 0   No current facility-administered medications for this visit.      ___________________________________________________________________ Objective   Exam:  BP 140/80   Pulse 88   Ht '5\' 11"'$  (1.803 m)   Wt 209 lb (94.8 kg)   BMI 29.15 kg/m  Wt Readings from Last 3 Encounters:  10/31/20 209 lb (94.8 kg)  09/25/20 213 lb (96.6 kg)  02/12/20 216 lb (98 kg)    General: Well-appearing Eyes: sclera anicteric, no redness ENT: oral mucosa moist without lesions, no cervical or supraclavicular lymphadenopathy CV: RRR without murmur, S1/S2, no JVD, no peripheral edema Resp: clear to auscultation bilaterally, normal RR and effort noted GI: soft, obese, LLQ/inguinal tenderness, with active bowel sounds. No guarding or palpable organomegaly noted.  Lower midline vertical scar from hysterectomy, no  ventral hernia Skin; warm and dry, no rash or jaundice noted Neuro: awake, alert and oriented x 3. Normal gross motor function and fluent speech  Labs:  CBC Latest Ref Rng & Units 09/20/2020 12/25/2019 09/13/2018  WBC 4.0 - 10.5 K/uL 7.5 4.9 3.4(L)  Hemoglobin 12.0 - 15.0 g/dL 13.1 13.2 13.9  Hematocrit 36.0 - 46.0 % 38.3 38.7 40.3  Platelets 150 - 400 K/uL 242 238 177   CMP Latest Ref Rng & Units 09/20/2020 12/25/2019 04/17/2019  Glucose 70 - 99 mg/dL 149(H) 124(H) 121(H)  BUN 6 - 20 mg/dL '10 13 19  '$ Creatinine 0.44 - 1.00 mg/dL 0.69 0.71 0.94  Sodium 135 - 145 mmol/L 136 138 137  Potassium 3.5 - 5.1 mmol/L 3.6 4.4 4.3  Chloride 98 - 111 mmol/L 100 100 98  CO2 22 - 32 mmol/L 25 22 19(L)  Calcium 8.9 - 10.3 mg/dL 10.1 9.9 10.4(H)  Total Protein 6.5 - 8.1 g/dL 8.4(H) 7.8 8.2  Total Bilirubin 0.3 - 1.2 mg/dL 0.8 0.6 0.6  Alkaline Phos 38 - 126 U/L 65 70 74  AST 15 - 41 U/L '18 19 18  '$ ALT 0 - 44 U/L '23 17 21     '$ Radiologic Studies:  CLINICAL DATA:  Patient from home. Complaint of groin pain for several weeks, pain getting worse, has an appointment with dr but states pain to much today.   EXAM: CT ABDOMEN AND PELVIS WITH CONTRAST   TECHNIQUE: Multidetector CT imaging of the abdomen and pelvis was performed using the standard protocol following bolus administration of intravenous contrast.   CONTRAST:  111m OMNIPAQUE IOHEXOL 300 MG/ML  SOLN   COMPARISON:  05/06/2008   FINDINGS: Lower chest: Clear lung bases.   Hepatobiliary: Liver normal in size. Decreased liver attenuation consistent with fatty infiltration. No liver mass or focal lesion. Normal gallbladder. No bile duct dilation.   Pancreas: Unremarkable. No pancreatic ductal dilatation or surrounding inflammatory changes.   Spleen: Cyst from the posteromedial margin of the spleen, 2.5 cm, enlarged compared to the prior CT. No other splenic masses or lesions. Spleen normal in size.   Adrenals/Urinary Tract: No  adrenal masses. 2 cm cyst, upper pole of the left kidney. No other renal masses, no stones and no hydronephrosis. Normal ureters. Normal bladder.   Stomach/Bowel: Inflammatory changes lie adjacent to the proximal sigmoid colon centered on an ill-defined diverticulum. There is no extraluminal air. There is no fluid collection to suggest an abscess.   There are additional diverticula along the left colon with no other inflammatory changes. Remainder of the colon is unremarkable. Normal stomach and small bowel. Normal appendix visualized.   Vascular/Lymphatic: No significant vascular findings are present. No enlarged abdominal or pelvic lymph nodes.   Reproductive: Status post hysterectomy. No adnexal masses.   Other: No abdominal wall hernia or abnormality. No abdominopelvic ascites.   Musculoskeletal: No fracture or acute finding.  No bone lesion.   IMPRESSION: 1. Acute uncomplicated sigmoid colon diverticulitis. No abscess. No extraluminal or free air. 2. No other acute abnormality. 3. Hepatic steatosis.     Electronically Signed   By: DLajean ManesM.D.   On: 09/20/2020 16:51   Assessment: Encounter Diagnoses  Name Primary?   Diverticulitis of colon Yes   Chronic constipation     First episode of acute uncomplicated diverticulitis.  She has residual pain, its difficult to characterize and determine whether it was the same pain occurring that brought her to the ED.  Could be musculoskeletal she also had exacerbation of it twisting or moving certain ways.  Could be residual diverticulitis.  Chronic constipation Average risk colorectal cancer Plan:  Augmentin 875/125 mg twice daily for 7 days  MiraLAX 1 packet/capful daily.  Increase dietary fiber, though sounds like this has been a struggle for her with the cost of food on limited income.  Colonoscopy.  She was agreeable after discussion of procedure and risks.  The benefits and risks of the planned procedure  were described in detail with the patient or (when appropriate) their health care proxy.  Risks were outlined as including, but not limited to, bleeding, infection, perforation, adverse medication reaction leading to cardiac or pulmonary decompensation, pancreatitis (if ERCP).  The limitation of incomplete mucosal visualization was also discussed.  No guarantees or warranties were given.   Thank you for the courtesy of this consult.  Please  call me with any questions or concerns.  Nelida Meuse III  CC: Referring provider noted above

## 2020-11-11 ENCOUNTER — Encounter: Payer: 59 | Admitting: Gastroenterology

## 2020-11-17 ENCOUNTER — Other Ambulatory Visit: Payer: Self-pay

## 2020-11-17 ENCOUNTER — Ambulatory Visit (AMBULATORY_SURGERY_CENTER): Payer: 59 | Admitting: Gastroenterology

## 2020-11-17 ENCOUNTER — Encounter: Payer: Self-pay | Admitting: Gastroenterology

## 2020-11-17 VITALS — BP 92/50 | HR 56 | Temp 97.7°F | Resp 11 | Ht 71.0 in | Wt 209.0 lb

## 2020-11-17 DIAGNOSIS — D122 Benign neoplasm of ascending colon: Secondary | ICD-10-CM

## 2020-11-17 DIAGNOSIS — K5732 Diverticulitis of large intestine without perforation or abscess without bleeding: Secondary | ICD-10-CM

## 2020-11-17 MED ORDER — SODIUM CHLORIDE 0.9 % IV SOLN: 500.0000 mL | Freq: Once | INTRAVENOUS | Status: DC

## 2020-11-17 NOTE — Progress Notes (Signed)
VS completed by CW.    Medical history reviewed and updated.

## 2020-11-17 NOTE — Progress Notes (Signed)
No changes to clinical history since GI office visit on 10/31/20.  The patient is appropriate for an endoscopic procedure in the ambulatory setting.

## 2020-11-17 NOTE — Op Note (Signed)
[facility] Patient Name: [name] Procedure Date: 11/17/2020 7:33 AM MRN: [id number] Endoscopist: Mallie Mussel L. Loletha Carrow , MD Age: 60 Referring MD:  Date of Birth: 07/25/1960 Gender: Female Account #: [id number] Procedure:                Colonoscopy Indications:              Follow-up of diverticulitis, Constipation Medicines:                Monitored Anesthesia Care Procedure:                Pre-Anesthesia Assessment:                           - Prior to the procedure, a History and Physical                            was performed, and patient medications and                            allergies were reviewed. The patient's tolerance of                            previous anesthesia was also reviewed. The risks                            and benefits of the procedure and the sedation                            options and risks were discussed with the patient.                            All questions were answered, and informed consent                            was obtained. Prior Anticoagulants: The patient has                            taken no previous anticoagulant or antiplatelet                            agents. ASA Grade Assessment: II - A patient with                            mild systemic disease. After reviewing the risks                            and benefits, the patient was deemed in                            satisfactory condition to undergo the procedure.                           After obtaining informed consent, the colonoscope  was passed under direct vision. Throughout the                            procedure, the patient's blood pressure, pulse, and                            oxygen saturations were monitored continuously. The                            Olympus PCF-H190DL [id number]) Colonoscope was                            introduced through the anus and advanced to the the                            terminal ileum, with  identification of the                            appendiceal orifice and IC valve. The colonoscopy                            was performed without difficulty. The patient                            tolerated the procedure well. The quality of the                            bowel preparation was good. The terminal ileum,                            ileocecal valve, appendiceal orifice, and rectum                            were photographed. The bowel preparation used was                            GoLYTELY. Scope In: 7:59:58 AM Scope Out: 8:18:05 AM Scope Withdrawal Time: 0 hours 14 minutes 4 seconds  Total Procedure Duration: 0 hours 18 minutes 7 seconds  Findings:                 The perianal and digital rectal examinations were                            normal.                           The terminal ileum appeared normal.                           Multiple diverticula were found in the left colon.                           Two flat polyps were found in the ascending colon.  The polyps were diminutive in size. These polyps                            were removed with a cold biopsy forceps. Resection                            and retrieval were complete.                           The exam was otherwise without abnormality on                            direct and retroflexion views. Complications:            No immediate complications. Estimated Blood Loss:     Estimated blood loss was minimal. Impression:               - The examined portion of the ileum was normal.                           - Diverticulosis in the left colon.                           - Two diminutive polyps in the ascending colon,                            removed with a cold biopsy forceps. Resected and                            retrieved.                           - The examination was otherwise normal on direct                            and retroflexion views. Recommendation:            - Patient has a contact number available for                            emergencies. The signs and symptoms of potential                            delayed complications were discussed with the                            patient. Return to normal activities tomorrow.                            Written discharge instructions were provided to the                            patient.                           - Resume previous diet.                           -  Continue present medications.                           - Await pathology results.                           - Repeat colonoscopy is recommended for                            surveillance. The colonoscopy date will be                            determined after pathology results from today's                            exam become available for review.                           - Continue regular use of miralax for constipation                            and return to clinic as needed. Zohar Maroney L. Loletha Carrow, MD 11/17/2020 8:23:15 AM This report has been signed electronically.

## 2020-11-17 NOTE — Progress Notes (Signed)
Called to room to assist during endoscopic procedure.  Patient ID and intended procedure confirmed with present staff. Received instructions for my participation in the procedure from the performing physician.  

## 2020-11-17 NOTE — Progress Notes (Signed)
PT taken to PACU. Monitors in place. VSS. Report given to RN. 

## 2020-11-17 NOTE — Patient Instructions (Signed)
Discharge instructions given. Handouts on polyps and Diverticulosis. Resume previous medications. YOU HAD AN ENDOSCOPIC PROCEDURE TODAY AT [facility]:   Refer to the procedure report that was given to you for any specific questions about what was found during the examination.  If the procedure report does not answer your questions, please call your gastroenterologist to clarify.  If you requested that your care partner not be given the details of your procedure findings, then the procedure report has been included in a sealed envelope for you to review at your convenience later.  YOU SHOULD EXPECT: Some feelings of bloating in the abdomen. Passage of more gas than usual.  Walking can help get rid of the air that was put into your GI tract during the procedure and reduce the bloating. If you had a lower endoscopy (such as a colonoscopy or flexible sigmoidoscopy) you may notice spotting of blood in your stool or on the toilet paper. If you underwent a bowel prep for your procedure, you may not have a normal bowel movement for a few days.  Please Note:  You might notice some irritation and congestion in your nose or some drainage.  This is from the oxygen used during your procedure.  There is no need for concern and it should clear up in a day or so.  SYMPTOMS TO REPORT IMMEDIATELY:  Following lower endoscopy (colonoscopy or flexible sigmoidoscopy):  Excessive amounts of blood in the stool  Significant tenderness or worsening of abdominal pains  Swelling of the abdomen that is new, acute  Fever of 100F or higher  For urgent or emergent issues, a gastroenterologist can be reached at any hour by calling [phone]. Do not use MyChart messaging for urgent concerns.    DIET:  We do recommend a small meal at first, but then you may proceed to your regular diet.  Drink plenty of fluids but you should avoid alcoholic beverages for 24 hours.  ACTIVITY:  You should plan to take it  easy for the rest of today and you should NOT DRIVE or use heavy machinery until tomorrow (because of the sedation medicines used during the test).    FOLLOW UP: Our staff will call the number listed on your records 48-72 hours following your procedure to check on you and address any questions or concerns that you may have regarding the information given to you following your procedure. If we do not reach you, we will leave a message.  We will attempt to reach you two times.  During this call, we will ask if you have developed any symptoms of COVID 19. If you develop any symptoms (ie: fever, flu-like symptoms, shortness of breath, cough etc.) before then, please call [phone].  If you test positive for Covid 19 in the 2 weeks post procedure, please call and report this information to [place].    If any biopsies were taken you will be contacted by phone or by letter within the next 1-3 weeks.  Please call us at [phone] if you have not heard about the biopsies in 3 weeks.    SIGNATURES/CONFIDENTIALITY: You and/or your care partner have signed paperwork which will be entered into your electronic medical record.  These signatures attest to the fact that that the information above on your After Visit Summary has been reviewed and is understood.  Full responsibility of the confidentiality of this discharge information lies with you and/or your care-partner.

## 2020-11-19 ENCOUNTER — Telehealth: Payer: Self-pay

## 2020-11-19 NOTE — Telephone Encounter (Signed)
  Follow up Call-  Call back number 11/17/2020  Post procedure Call Back phone  # [phone]  Permission to leave phone message Yes  Some recent data might be hidden     Patient questions:  Do you have a fever, pain , or abdominal swelling? No. Pain Score  0 *  Have you tolerated food without any problems? Yes.    Have you been able to return to your normal activities? Yes.    Do you have any questions about your discharge instructions: Diet   No. Medications  No. Follow up visit  No.  Do you have questions or concerns about your Care? No.  Actions: * If pain score is 4 or above: No action needed, pain <4. Have you develnooped a fever since your procedure?   2.   Have you had an respiratory symptoms (SOB or cough) since your procedure? no  3.   Have you tested positive for COVID 19 since your procedure no  4.   Have you had any family members/close contacts diagnosed with the COVID 19 since your procedure?  no   If yes to any of these questions please route to Joylene John, RN and Joella Prince, RN

## 2020-11-25 ENCOUNTER — Encounter: Payer: Self-pay | Admitting: Gastroenterology

## 2020-12-17 ENCOUNTER — Encounter (HOSPITAL_BASED_OUTPATIENT_CLINIC_OR_DEPARTMENT_OTHER): Payer: Self-pay | Admitting: Obstetrics and Gynecology

## 2020-12-17 ENCOUNTER — Other Ambulatory Visit: Payer: Self-pay

## 2020-12-17 ENCOUNTER — Other Ambulatory Visit (HOSPITAL_BASED_OUTPATIENT_CLINIC_OR_DEPARTMENT_OTHER): Payer: Self-pay

## 2020-12-17 ENCOUNTER — Emergency Department (HOSPITAL_BASED_OUTPATIENT_CLINIC_OR_DEPARTMENT_OTHER)
Admission: EM | Admit: 2020-12-17 | Discharge: 2020-12-17 | Disposition: A | Discharge status: Home / Self Care | Payer: 59 | Attending: Emergency Medicine | Admitting: Emergency Medicine

## 2020-12-17 DIAGNOSIS — I10 Essential (primary) hypertension: Secondary | ICD-10-CM | POA: Insufficient documentation

## 2020-12-17 DIAGNOSIS — Z79899 Other long term (current) drug therapy: Secondary | ICD-10-CM | POA: Diagnosis not present

## 2020-12-17 DIAGNOSIS — Z23 Encounter for immunization: Secondary | ICD-10-CM | POA: Diagnosis not present

## 2020-12-17 DIAGNOSIS — Z7984 Long term (current) use of oral hypoglycemic drugs: Secondary | ICD-10-CM | POA: Insufficient documentation

## 2020-12-17 DIAGNOSIS — Y92410 Unspecified street and highway as the place of occurrence of the external cause: Secondary | ICD-10-CM | POA: Diagnosis not present

## 2020-12-17 DIAGNOSIS — S01511A Laceration without foreign body of lip, initial encounter: Secondary | ICD-10-CM | POA: Diagnosis not present

## 2020-12-17 MED ORDER — PENICILLIN V POTASSIUM 500 MG PO TABS: 500.0000 mg | ORAL_TABLET | Freq: Three times a day (TID) | ORAL | 0 refills | Status: DC

## 2020-12-17 MED ORDER — DICLOFENAC SODIUM 50 MG PO TBEC
50.0000 mg | DELAYED_RELEASE_TABLET | Freq: Two times a day (BID) | ORAL | 0 refills | Status: AC
  Filled 2020-12-17: qty 20, 10d supply, fill #0

## 2020-12-17 MED ORDER — TETANUS-DIPHTH-ACELL PERTUSSIS 5-2.5-18.5 LF-MCG/0.5 IM SUSY
0.5000 mL | PREFILLED_SYRINGE | Freq: Once | INTRAMUSCULAR | Status: AC
  Administered 2020-12-17: 0.5 mL via INTRAMUSCULAR
  Filled 2020-12-17: qty 0.5

## 2020-12-17 MED ORDER — PENICILLIN V POTASSIUM 500 MG PO TABS
500.0000 mg | ORAL_TABLET | Freq: Three times a day (TID) | ORAL | 0 refills | Status: AC
  Filled 2020-12-17: qty 30, 10d supply, fill #0
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
## 2020-12-17 NOTE — Discharge Instructions (Addendum)
Keep wound clean. Rinse with Listerine after every meal.  Follow up with a dentist, call to schedule an appointment. Take penicillin as prescribed and complete the full course.
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
## 2020-12-18 ENCOUNTER — Other Ambulatory Visit (HOSPITAL_BASED_OUTPATIENT_CLINIC_OR_DEPARTMENT_OTHER): Payer: Self-pay

## 2021-01-03 ENCOUNTER — Other Ambulatory Visit (HOSPITAL_COMMUNITY): Payer: Self-pay

## 2021-01-07 ENCOUNTER — Other Ambulatory Visit (HOSPITAL_BASED_OUTPATIENT_CLINIC_OR_DEPARTMENT_OTHER): Payer: Self-pay

## 2021-01-26 ENCOUNTER — Ambulatory Visit: Payer: 59 | Admitting: Internal Medicine

## 2021-02-06 ENCOUNTER — Ambulatory Visit: Payer: 59 | Admitting: Nurse Practitioner

## 2021-02-11 ENCOUNTER — Other Ambulatory Visit: Payer: Self-pay

## 2021-02-13 ENCOUNTER — Other Ambulatory Visit: Payer: Self-pay

## 2021-03-11 ENCOUNTER — Other Ambulatory Visit (HOSPITAL_COMMUNITY): Payer: Self-pay

## 2021-03-13 ENCOUNTER — Other Ambulatory Visit (HOSPITAL_COMMUNITY): Payer: Self-pay

## 2021-03-20 ENCOUNTER — Telehealth: Payer: Self-pay | Admitting: Critical Care Medicine

## 2021-03-20 NOTE — Telephone Encounter (Signed)
Copied from [place] [id number]. Topic: Appointment Scheduling - Scheduling Inquiry for Clinic >> Mar 20, 2021  9:35 AM Valere Dross wrote: Reason for CRM: Pt called in stating her appt on the 17th, she stated she told someone that she needed a time after 78, do to work, and was very upset because she needs to be seen, and requested if she could be squeezed in after 12. Please advise. >> Mar 20, 2021  1:21 PM Leward Quan A wrote: Patient called back to speak to Dr Ileana Roup nurse about her appointment on 03/24/21 she was upset that she have been waiting on this visit and the time is wrong. If there is anything that can be done for Dr Joya Gaskins to see her she would appreciate it. Patient have been waiting 3 months for this appointment. Please call patient at Ph# [phone]

## 2021-03-23 NOTE — Telephone Encounter (Signed)
Called pt and rescheduled another appointment

## 2021-03-24 ENCOUNTER — Ambulatory Visit (HOSPITAL_COMMUNITY)
Admission: EM | Admit: 2021-03-24 | Discharge: 2021-03-24 | Disposition: A | Discharge status: Home / Self Care | Payer: Self-pay | Attending: Family Medicine | Admitting: Family Medicine

## 2021-03-24 ENCOUNTER — Ambulatory Visit: Payer: Self-pay | Admitting: Critical Care Medicine

## 2021-03-24 ENCOUNTER — Other Ambulatory Visit: Payer: Self-pay

## 2021-03-24 ENCOUNTER — Other Ambulatory Visit (HOSPITAL_COMMUNITY): Payer: Self-pay

## 2021-03-24 ENCOUNTER — Encounter (HOSPITAL_COMMUNITY): Payer: Self-pay | Admitting: *Deleted

## 2021-03-24 DIAGNOSIS — J0100 Acute maxillary sinusitis, unspecified: Secondary | ICD-10-CM

## 2021-03-24 DIAGNOSIS — R052 Subacute cough: Secondary | ICD-10-CM

## 2021-03-24 MED ORDER — AMOXICILLIN-POT CLAVULANATE 875-125 MG PO TABS
1.0000 | ORAL_TABLET | Freq: Two times a day (BID) | ORAL | 0 refills | Status: DC
  Filled 2021-03-24: qty 20, 10d supply, fill #0

## 2021-03-24 MED ORDER — BENZONATATE 100 MG PO CAPS
100.0000 mg | ORAL_CAPSULE | Freq: Three times a day (TID) | ORAL | 0 refills | Status: DC
  Filled 2021-03-24: qty 21, 7d supply, fill #0

## 2021-03-24 NOTE — ED Provider Notes (Signed)
[facility]    CSN: [id number] Arrival date & time: 03/24/21  1240      History   Chief Complaint Chief Complaint  Patient presents with   Headache   Nasal Congestion   Facial Pain    HPI [name] is a 61 y.o. female.   Last weekend started with cough, chest congestion.  Took otc meds without much help.  Then with severe sinus congestion, drainage.  The left eye has been aching, and draining.  Having headache, sinus pressure.  No ear pain/pressure.  No wheezing or sob.  No fever.  Today she felt weak, and had to leave work.   Past Medical History:  Diagnosis Date   Bilateral carpal tunnel syndrome    Hypercholesteremia    Hypertension    Hypertension    OAB (overactive bladder)    Rheumatoid arthritis [facility])     Patient Active Problem List   Diagnosis Date Noted   Pain in left elbow 02/12/2020   Bilateral hand pain 02/12/2020   Rheumatoid factor positive 12/10/2016   Chronic arthralgias of knees and hips 12/10/2016   Patellofemoral arthritis 04/30/2015   Knee pain, chronic 04/02/2015   DIVERTICULAR DISEASE 11/26/2009   ALLERGIC RHINITIS 10/16/2009   RECTAL PAIN 10/16/2009   CONSTIPATION 03/11/2009   ANEMIA, IRON DEFICIENCY 02/21/2009   HYPERTENSION, BENIGN ESSENTIAL 02/19/2009   GERD 02/19/2009   VAGINAL PRURITUS 02/19/2009   FREQUENCY, URINARY 02/19/2009   MENOPAUSE, SURGICAL 03/08/2004    Past Surgical History:  Procedure Laterality Date   ABDOMINAL HYSTERECTOMY      OB History   No obstetric history on file.      Home Medications    Prior to Admission medications   Medication Sig Start Date End Date Taking? Authorizing Provider  atorvastatin (LIPITOR) 10 MG tablet TAKE 1 TABLET (10 MG TOTAL) BY MOUTH DAILY. TO LOWER CHOLESTEROL 09/25/20 09/25/21  Argentina Donovan, PA-C  fluticasone (FLONASE) 50 MCG/ACT nasal spray Place 2 sprays into both nostrils daily. 09/25/20   Argentina Donovan, PA-C  HYDROcodone-acetaminophen  (NORCO/VICODIN) 5-325 MG tablet Take 1 tablet by mouth every 6 (six) hours as needed. Patient not taking: No sig reported 09/20/20   Couture, Cortni S, PA-C  metFORMIN (GLUCOPHAGE) 500 MG tablet Take 2 tablets (1,000 mg total) by mouth 2 (two) times daily with a meal. 10/01/20 10/01/21  McClung, Dionne Bucy, PA-C  ondansetron (ZOFRAN) 4 MG tablet Take 1 tablet (4 mg total) by mouth every 6 (six) hours. 09/20/20   Couture, Cortni S, PA-C  triamterene-hydrochlorothiazide (MAXZIDE-25) 37.5-25 MG tablet TAKE 1 TABLET BY MOUTH DAILY. 09/25/20 09/25/21  Argentina Donovan, PA-C    Family History Family History  Problem Relation Age of Onset   Hypertension Mother    Diabetes Mother    Cancer Father    Diabetes Brother    Colon cancer Neg Hx    Rectal cancer Neg Hx    Stomach cancer Neg Hx     Social History Social History   Tobacco Use   Smoking status: Never   Smokeless tobacco: Never  Vaping Use   Vaping Use: Never used  Substance Use Topics   Alcohol use: Yes    Alcohol/week: 2.0 standard drinks    Types: 2 Cans of beer per week    Comment: Occasionally   Drug use: Not Currently    Frequency: 2.0 times per week    Types: Marijuana     Allergies   Patient has no known  allergies.   Review of Systems Review of Systems  Constitutional:  Positive for fatigue. Negative for fever.  HENT:  Positive for congestion, rhinorrhea, sinus pressure and sinus pain. Negative for ear pain and sore throat.   Eyes:  Positive for pain and discharge.  Respiratory:  Positive for cough. Negative for shortness of breath and wheezing.   Cardiovascular: Negative.   Gastrointestinal: Negative.   Neurological:  Positive for headaches.    Physical Exam Triage Vital Signs ED Triage Vitals  Enc Vitals Group     BP 03/24/21 1301 (!) 134/94     Pulse Rate 03/24/21 1301 77     Resp 03/24/21 1301 18     Temp 03/24/21 1301 99.8 F (37.7 C)     Temp src --      SpO2 03/24/21 1301 98 %     Weight --       Height --      Head Circumference --      Peak Flow --      Pain Score 03/24/21 1303 7     Pain Loc --      Pain Edu? --      Excl. in [place]? --    No data found.  Updated Vital Signs BP (!) 134/94    Pulse 77    Temp 99.8 F (37.7 C)    Resp 18    SpO2 98%   Visual Acuity Right Eye Distance:   Left Eye Distance:   Bilateral Distance:    Right Eye Near:   Left Eye Near:    Bilateral Near:     Physical Exam Constitutional:      Appearance: She is well-developed.  HENT:     Head: Normocephalic and atraumatic.     Nose: Congestion and rhinorrhea present.     Mouth/Throat:     Mouth: Mucous membranes are moist.  Cardiovascular:     Rate and Rhythm: Normal rate and regular rhythm.  Pulmonary:     Effort: Pulmonary effort is normal.     Breath sounds: Normal breath sounds.  Musculoskeletal:     Cervical back: Normal range of motion and neck supple.  Lymphadenopathy:     Cervical: Cervical adenopathy present.  Neurological:     Mental Status: She is alert.     UC Treatments / Results  Labs (all labs ordered are listed, but only abnormal results are displayed) Labs Reviewed - No data to display  EKG   Radiology No results found.  Procedures Procedures (including critical care time)  Medications Ordered in UC Medications - No data to display  Initial Impression / Assessment and Plan / UC Course  I have reviewed the triage vital signs and the nursing notes.  Pertinent labs & imaging results that were available during my care of the patient were reviewed by me and considered in my medical decision making (see chart for details).    Patient was seen for URI symptoms and diagnosed with sinusitis.  Rx for augmentin x 10 days, along with tessalon perles and supportive care.  Follow up if not improving.   Final Clinical Impressions(s) / UC Diagnoses   Final diagnoses:  Acute non-recurrent maxillary sinusitis  Subacute cough     Discharge Instructions       You were diagnosed with a sinus infection today.  I have sent out an antibiotic and cough medication for you.  I do recommend over the counter zyrtec or claritin to help with your sinus  symptoms.  Get plenty of rest and plenty of fluids.  Please follow up with your PCP if not improving.     ED Prescriptions     Medication Sig Dispense Auth. Provider   amoxicillin-clavulanate (AUGMENTIN) 875-125 MG tablet Take 1 tablet by mouth every 12 (twelve) hours. 20 tablet Zhion Pevehouse, MD   benzonatate (TESSALON) 100 MG capsule Take 1 capsule (100 mg total) by mouth every 8 (eight) hours. 21 capsule Rondel Oh, MD      PDMP not reviewed this encounter.   Rondel Oh, MD 03/24/21 [phone]

## 2021-03-24 NOTE — Discharge Instructions (Addendum)
You were diagnosed with a sinus infection today.  I have sent out an antibiotic and cough medication for you.  I do recommend over the counter zyrtec or claritin to help with your sinus symptoms.  Get plenty of rest and plenty of fluids.  Please follow up with your PCP if not improving.

## 2021-03-24 NOTE — ED Triage Notes (Signed)
Pt reports sinus pain,congestion,Ha.Pt has tried OTC with out relief.

## 2021-03-29 NOTE — Progress Notes (Incomplete)
Established Patient Office Visit  Subjective:  Patient ID: [name], female    DOB: September 12, 1960  Age: 61 y.o. MRN: [id number]  CC: No chief complaint on file.   HPI [name] presents for  Flu pap urine alb   Past Medical History:  Diagnosis Date   Bilateral carpal tunnel syndrome    Hypercholesteremia    Hypertension    Hypertension    OAB (overactive bladder)    Rheumatoid arthritis ([place])     Past Surgical History:  Procedure Laterality Date   ABDOMINAL HYSTERECTOMY      Family History  Problem Relation Age of Onset   Hypertension Mother    Diabetes Mother    Cancer Father    Diabetes Brother    Colon cancer Neg Hx    Rectal cancer Neg Hx    Stomach cancer Neg Hx     Social History   Socioeconomic History   Marital status: Single    Spouse name: Not on file   Number of children: 2   Years of education: Not on file   Highest education level: 11th grade  Occupational History   Not on file  Tobacco Use   Smoking status: Never   Smokeless tobacco: Never  Vaping Use   Vaping Use: Never used  Substance and Sexual Activity   Alcohol use: Yes    Alcohol/week: 2.0 standard drinks    Types: 2 Cans of beer per week    Comment: Occasionally   Drug use: Not Currently    Frequency: 2.0 times per week    Types: Marijuana   Sexual activity: Not Currently    Birth control/protection: Surgical  Other Topics Concern   Not on file  Social History Narrative   Not on file   Social Determinants of Health   Financial Resource Strain: Not on file  Food Insecurity: Not on file  Transportation Needs: Not on file  Physical Activity: Not on file  Stress: Not on file  Social Connections: Not on file  Intimate Partner Violence: Not on file    Outpatient Medications Prior to Visit  Medication Sig Dispense Refill   amoxicillin-clavulanate (AUGMENTIN) 875-125 MG tablet Take 1 tablet by mouth every 12 (twelve) hours. 20 tablet 0    atorvastatin (LIPITOR) 10 MG tablet TAKE 1 TABLET (10 MG TOTAL) BY MOUTH DAILY. TO LOWER CHOLESTEROL 90 tablet 1   benzonatate (TESSALON) 100 MG capsule Take 1 capsule (100 mg total) by mouth every 8 (eight) hours. 21 capsule 0   fluticasone (FLONASE) 50 MCG/ACT nasal spray Place 2 sprays into both nostrils daily. 16 g 6   HYDROcodone-acetaminophen (NORCO/VICODIN) 5-325 MG tablet Take 1 tablet by mouth every 6 (six) hours as needed. (Patient not taking: No sig reported) 6 tablet 0   metFORMIN (GLUCOPHAGE) 500 MG tablet Take 2 tablets (1,000 mg total) by mouth 2 (two) times daily with a meal. 120 tablet 3   ondansetron (ZOFRAN) 4 MG tablet Take 1 tablet (4 mg total) by mouth every 6 (six) hours. 12 tablet 0   triamterene-hydrochlorothiazide (MAXZIDE-25) 37.5-25 MG tablet TAKE 1 TABLET BY MOUTH DAILY. 90 tablet 1   No facility-administered medications prior to visit.    No Known Allergies  ROS Review of Systems    Objective:    Physical Exam  There were no vitals taken for this visit. Wt Readings from Last 3 Encounters:  11/17/20 209 lb (94.8 kg)  10/31/20 209 lb (94.8 kg)  09/25/20 213 lb (  96.6 kg)     Health Maintenance Due  Topic Date Due   COVID-19 Vaccine (1) Never done   Zoster Vaccines- Shingrix (1 of 2) Never done   URINE MICROALBUMIN  03/11/2010   PAP SMEAR-Modifier  03/11/2012   INFLUENZA VACCINE  Never done    There are no preventive care reminders to display for this patient.  Lab Results  Component Value Date   TSH 0.678 11/05/2013   Lab Results  Component Value Date   WBC 7.5 09/20/2020   HGB 13.1 09/20/2020   HCT 38.3 09/20/2020   MCV 87.8 09/20/2020   PLT 242 09/20/2020   Lab Results  Component Value Date   NA 136 09/20/2020   K 3.6 09/20/2020   CO2 25 09/20/2020   GLUCOSE 149 (H) 09/20/2020   BUN 10 09/20/2020   CREATININE 0.69 09/20/2020   BILITOT 0.8 09/20/2020   ALKPHOS 65 09/20/2020   AST 18 09/20/2020   ALT 23  09/20/2020   PROT 8.4 (H) 09/20/2020   ALBUMIN 4.8 09/20/2020   CALCIUM 10.1 09/20/2020   ANIONGAP 11 09/20/2020   Lab Results  Component Value Date   CHOL 196 12/25/2019   Lab Results  Component Value Date   HDL 84 12/25/2019   Lab Results  Component Value Date   LDLCALC 94 12/25/2019   Lab Results  Component Value Date   TRIG 104 12/25/2019   Lab Results  Component Value Date   CHOLHDL 2.3 12/25/2019   Lab Results  Component Value Date   HGBA1C 6.9 (H) 09/25/2020      Assessment & Plan:   Problem List Items Addressed This Visit   None   No orders of the defined types were placed in this encounter.   Follow-up: No follow-ups on file.    Chelsea Noble, MD

## 2021-03-30 ENCOUNTER — Other Ambulatory Visit (HOSPITAL_BASED_OUTPATIENT_CLINIC_OR_DEPARTMENT_OTHER): Payer: Self-pay

## 2021-03-30 ENCOUNTER — Other Ambulatory Visit: Payer: Self-pay

## 2021-03-30 ENCOUNTER — Ambulatory Visit: Payer: Self-pay | Attending: Internal Medicine | Admitting: Critical Care Medicine

## 2021-03-30 ENCOUNTER — Encounter: Payer: Self-pay | Admitting: Critical Care Medicine

## 2021-03-30 VITALS — BP 130/84 | HR 78 | Resp 16 | Wt 207.6 lb

## 2021-03-30 DIAGNOSIS — M79641 Pain in right hand: Secondary | ICD-10-CM

## 2021-03-30 DIAGNOSIS — I10 Essential (primary) hypertension: Secondary | ICD-10-CM

## 2021-03-30 DIAGNOSIS — E785 Hyperlipidemia, unspecified: Secondary | ICD-10-CM

## 2021-03-30 DIAGNOSIS — J0100 Acute maxillary sinusitis, unspecified: Secondary | ICD-10-CM | POA: Insufficient documentation

## 2021-03-30 DIAGNOSIS — M79642 Pain in left hand: Secondary | ICD-10-CM

## 2021-03-30 DIAGNOSIS — J3089 Other allergic rhinitis: Secondary | ICD-10-CM

## 2021-03-30 DIAGNOSIS — R768 Other specified abnormal immunological findings in serum: Secondary | ICD-10-CM

## 2021-03-30 DIAGNOSIS — R350 Frequency of micturition: Secondary | ICD-10-CM

## 2021-03-30 DIAGNOSIS — E1165 Type 2 diabetes mellitus with hyperglycemia: Secondary | ICD-10-CM | POA: Insufficient documentation

## 2021-03-30 DIAGNOSIS — E669 Obesity, unspecified: Secondary | ICD-10-CM | POA: Insufficient documentation

## 2021-03-30 DIAGNOSIS — E1169 Type 2 diabetes mellitus with other specified complication: Secondary | ICD-10-CM | POA: Insufficient documentation

## 2021-03-30 HISTORY — DX: Acute maxillary sinusitis, unspecified: J01.00

## 2021-03-30 LAB — GLUCOSE, POCT (MANUAL RESULT ENTRY): POC Glucose: 200 mg/dl — AB (ref 70–99)

## 2021-03-30 MED ORDER — ATORVASTATIN CALCIUM 10 MG PO TABS
ORAL_TABLET | ORAL | 1 refills | Status: DC
  Filled 2021-03-30: qty 90, fill #0
  Filled 2021-08-07 – 2021-08-11 (×2): qty 30, 30d supply, fill #0

## 2021-03-30 MED ORDER — TRIAMTERENE-HCTZ 37.5-25 MG PO TABS
1.0000 | ORAL_TABLET | Freq: Every day | ORAL | 2 refills | Status: DC
  Filled 2021-03-30: qty 90, 90d supply, fill #0
  Filled 2021-08-07 – 2021-08-11 (×2): qty 30, 30d supply, fill #0

## 2021-03-30 MED ORDER — METFORMIN HCL 500 MG PO TABS
1000.0000 mg | ORAL_TABLET | Freq: Every day | ORAL | 3 refills | Status: DC
  Filled 2021-03-30 – 2021-05-21 (×3): qty 120, 60d supply, fill #0
  Filled 2021-08-26: qty 120, 60d supply, fill #1
  Filled 2021-08-26: qty 60, 30d supply, fill #0

## 2021-03-30 NOTE — Assessment & Plan Note (Signed)
Hand pain is stable for now, has not worsened. Recommend that she continue with conservative management including bracing in neutral position and over the counter pain management such as Ibuprofen as needed.

## 2021-03-30 NOTE — Progress Notes (Signed)
Established Patient Office Visit  Subjective:  Patient ID: [name], female    DOB: Nov 21, 1960  Age: 61 y.o. MRN: [id number]  CC:  Chief Complaint  Patient presents with   Abdominal Pain    groin   Groin Pain    HPI [name] presents for follow up. She has previously seen other providers in this practice, most recently Freeman Caldron PA-C in July 2022. She has diagnoses of hypertension, hyperlipidemia, and Type 2 Diabetes all of which are being treated medically at this time. 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   She was seen at urgent care 1 week ago and diagnosed with bacterial sinusitis. She was prescribed amoxicillin clavulanate. She took this until her symptoms started to improve. She still has some congestion and a scratchy feeling in the throat, but she says that the amount of congestion she has today is typical for her.   The patient's POC blood glucose was 200 mg/dL. She admits that she has not been taking her blood sugars at home, and that she often forgets to take her metformin. The patient admits that her diet includes soda, juice, and steak, with very few vegetables and fruits.   The patient's blood pressure is at goal today at 130/84. She says she usually takes her Triamterine-HCTZ and atorvastatin, but she does not always remember to. She is interested in decreasing the number of medications that she has to take.   The patient has complaint today of a feeling of "heaviness" in her left groin area. She finds it mostly bothersome. She also experiences frequent urination. She states that she will go to urinate, feel like she has finished, and then when she stands up, has the feeling that she needs to go again. She denies any pain or burning sensation with urination.   The patient reports continued soreness and pain in her hand, mostly in her right palm. She had a workup done with rheumatology in 02/2020, after a positive test for  rheumatoid factor. At that visit, rheumatoid factor was retested and found negative. X-rays showed osteoarthritis in both hands. It was recommended that she continue follow up with primary care unless symptoms worsen.  The patient is due for A1C screening, urine microalbuminuria, and a lipid panel. She had a colonoscopy done on 11/17/2020 that showed diverticulosis of the colon and a benign polyp in the ascending colon. She is due for a pap smear, and would like to schedule with one of the female providers at this office. She is not interested in receiving the flu shot today.     Past Medical History:  Diagnosis Date   Bilateral carpal tunnel syndrome    Hypercholesteremia    Hypertension    Hypertension    OAB (overactive bladder)    Rheumatoid arthritis ([place])     Past Surgical History:  Procedure Laterality Date   ABDOMINAL HYSTERECTOMY      Family History  Problem Relation Age of Onset   Hypertension Mother    Diabetes Mother    Cancer Father    Diabetes Brother    Colon cancer Neg Hx    Rectal cancer Neg Hx    Stomach cancer Neg Hx     Social History   Socioeconomic History   Marital status: Single    Spouse name: Not on file   Number of children: 2   Years of education: Not on file   Highest education level: 11th grade  Occupational History   Not on file  Tobacco Use   Smoking status: Never   Smokeless tobacco: Never  Vaping Use   Vaping Use: Never used  Substance and Sexual Activity   Alcohol use: Yes    Alcohol/week: 2.0 standard drinks    Types: 2 Cans of beer per week    Comment: Occasionally   Drug use: Not Currently    Frequency: 2.0 times per week    Types: Marijuana   Sexual activity: Not Currently    Birth control/protection: Surgical  Other Topics Concern   Not on file  Social History Narrative   Not on file   Social Determinants of Health   Financial Resource Strain: Not on file  Food Insecurity: Not on file  Transportation Needs: Not on  file  Physical Activity: Not on file  Stress: Not on file  Social Connections: Not on file  Intimate Partner Violence: Not on file    Outpatient Medications Prior to Visit  Medication Sig Dispense Refill   amoxicillin-clavulanate (AUGMENTIN) 875-125 MG tablet Take 1 tablet by mouth every 12 (twelve) hours. 20 tablet 0   atorvastatin (LIPITOR) 10 MG tablet TAKE 1 TABLET (10 MG TOTAL) BY MOUTH DAILY. TO LOWER CHOLESTEROL 90 tablet 1   metFORMIN (GLUCOPHAGE) 500 MG tablet Take 2 tablets (1,000 mg total) by mouth 2 (two) times daily with a meal. 120 tablet 3   triamterene-hydrochlorothiazide (MAXZIDE-25) 37.5-25 MG tablet TAKE 1 TABLET BY MOUTH DAILY. 90 tablet 1   benzonatate (TESSALON) 100 MG capsule Take 1 capsule (100 mg total) by mouth every 8 (eight) hours. (Patient not taking: Reported on 03/30/2021) 21 capsule 0   fluticasone (FLONASE) 50 MCG/ACT nasal spray Place 2 sprays into both nostrils daily. (Patient not taking: Reported on 03/30/2021) 16 g 6   HYDROcodone-acetaminophen (NORCO/VICODIN) 5-325 MG tablet Take 1 tablet by mouth every 6 (six) hours as needed. (Patient not taking: Reported on 10/31/2020) 6 tablet 0   ondansetron (ZOFRAN) 4 MG tablet Take 1 tablet (4 mg total) by mouth every 6 (six) hours. (Patient not taking: Reported on 03/30/2021) 12 tablet 0   No facility-administered medications prior to visit.    No Known Allergies  ROS Review of Systems  HENT:  Positive for congestion, ear pain, rhinorrhea and sinus pain.   Eyes: Negative.   Respiratory:  Positive for cough. Negative for shortness of breath.   Gastrointestinal:  Positive for abdominal pain and constipation. Negative for diarrhea, nausea and vomiting.  Genitourinary:  Positive for frequency, pelvic pain and urgency. Negative for dysuria.  Musculoskeletal:  Positive for arthralgias.  Skin: Negative.   Neurological:  Positive for dizziness. Negative for headaches.  Psychiatric/Behavioral:  Positive for sleep  disturbance. Negative for dysphoric mood. The patient is not nervous/anxious.      Objective:    Physical Exam Constitutional:      Appearance: She is well-developed.  HENT:     Head: Normocephalic and atraumatic.     Nose: Nose normal. No congestion or rhinorrhea.     Mouth/Throat:     Mouth: Mucous membranes are moist.     Pharynx: Oropharynx is clear.  Eyes:     General: No scleral icterus. Cardiovascular:     Rate and Rhythm: Normal rate and regular rhythm.     Heart sounds: Normal heart sounds. No murmur heard.   No friction rub. No gallop.  Pulmonary:     Effort: Pulmonary effort is normal.     Breath sounds: Normal breath  sounds. No wheezing, rhonchi or rales.  Abdominal:     General: Abdomen is flat.     Palpations: Abdomen is soft.     Tenderness: There is no abdominal tenderness. There is no guarding.     Hernia: No hernia is present.  Musculoskeletal:        General: Normal range of motion.  Skin:    General: Skin is warm and dry.  Neurological:     General: No focal deficit present.     Mental Status: She is alert.  Psychiatric:        Mood and Affect: Mood normal.        Behavior: Behavior normal.        Thought Content: Thought content normal.        Judgment: Judgment normal.    BP 130/84    Pulse 78    Resp 16    Wt 207 lb 9.6 oz (94.2 kg)    SpO2 98%    BMI 28.95 kg/m  Wt Readings from Last 3 Encounters:  03/30/21 207 lb 9.6 oz (94.2 kg)  11/17/20 209 lb (94.8 kg)  10/31/20 209 lb (94.8 kg)     Health Maintenance Due  Topic Date Due   COVID-19 Vaccine (1) Never done   FOOT EXAM  Never done   OPHTHALMOLOGY EXAM  Never done   Zoster Vaccines- Shingrix (1 of 2) Never done   URINE MICROALBUMIN  03/11/2010   PAP SMEAR-Modifier  03/11/2012   INFLUENZA VACCINE  Never done   HEMOGLOBIN A1C  03/28/2021    There are no preventive care reminders to display for this patient.  Lab Results  Component Value Date   TSH 0.678 11/05/2013   Lab  Results  Component Value Date   WBC 7.5 09/20/2020   HGB 13.1 09/20/2020   HCT 38.3 09/20/2020   MCV 87.8 09/20/2020   PLT 242 09/20/2020   Lab Results  Component Value Date   NA 136 09/20/2020   K 3.6 09/20/2020   CO2 25 09/20/2020   GLUCOSE 149 (H) 09/20/2020   BUN 10 09/20/2020   CREATININE 0.69 09/20/2020   BILITOT 0.8 09/20/2020   ALKPHOS 65 09/20/2020   AST 18 09/20/2020   ALT 23 09/20/2020   PROT 8.4 (H) 09/20/2020   ALBUMIN 4.8 09/20/2020   CALCIUM 10.1 09/20/2020   ANIONGAP 11 09/20/2020   Lab Results  Component Value Date   CHOL 196 12/25/2019   Lab Results  Component Value Date   HDL 84 12/25/2019   Lab Results  Component Value Date   LDLCALC 94 12/25/2019   Lab Results  Component Value Date   TRIG 104 12/25/2019   Lab Results  Component Value Date   CHOLHDL 2.3 12/25/2019   Lab Results  Component Value Date   HGBA1C 6.9 (H) 09/25/2020      Assessment & Plan:   Problem List Items Addressed This Visit       Cardiovascular and Mediastinum   Essential hypertension    Essential hypertension currently at goal plan to continue Maxide daily      Relevant Medications   triamterene-hydrochlorothiazide (MAXZIDE-25) 37.5-25 MG tablet   atorvastatin (LIPITOR) 10 MG tablet     Respiratory   Allergic rhinitis    The patient's sinuses were clear on inspection, but with mild redness and irritation. Her throat irritation appears to be a result of her post-nasal drip. I recommended that she begin taking cetirizine (Zyrtec) for her allergy-type symptoms and  post-nasal drip.       Acute non-recurrent maxillary sinusitis    The patient was given antibiotics for sinusitis on 03/23/2021 by urgent care PA. I encouraged her to continue to take the full course of antibiotics as directed to ensure clearance of the infection.         Endocrine   Type 2 diabetes mellitus with hyperglycemia, without long-term current use of insulin ([place]) - Primary    diabetes  is not well-controlled. HbA1C was ordered and completed at the lab today. Urine microalbuminuria was also ordered and completed today to assess for any kidney complications from diabetes. Changed metformin to 1000 mg once daily. The patient expressed that she thinks she will find it easier to comply with this dosing regimen and will be able to more consistently take her medications.   We had an extensive discussion today on lifestyle management and diet to manage diabetes. I explained that aggressive lifestyle management may decrease her need for medications, which appeals to her. We discussed decreasing red meat consumption and increasing consumption of lean meats such as chicken and fish. We discussed filling half the plate with fresh vegetables and fruits that she enjoys. Discussed decreasing dietary sugar by stopping soda consumption, and adding flavoring to water such as lemon or berries to enhance taste, instead of drinking juice or adding sugar. She was provided with a diet handout today to reference.   Plan to meet with [name], clinical pharmacist, in 3 weeks to review diabetes management.  May need to add a GLP 1 inhibitor such as Trulicity      Relevant Medications   metFORMIN (GLUCOPHAGE) 500 MG tablet   atorvastatin (LIPITOR) 10 MG tablet   Other Relevant Orders   Microalbumin / creatinine urine ratio   HgB A1c   Hemoglobin A1c   POCT glucose (manual entry) (Completed)   Hyperlipidemia associated with type 2 diabetes mellitus ([place])    The patient is due for a lipid panel. Her last one was done February 2022. Continue atorvastatin 10 mg at present. Depending on results of lipid panel, further adjustment may be needed.      Relevant Medications   metFORMIN (GLUCOPHAGE) 500 MG tablet   triamterene-hydrochlorothiazide (MAXZIDE-25) 37.5-25 MG tablet   atorvastatin (LIPITOR) 10 MG tablet   Other Relevant Orders   Lipid panel     Other   FREQUENCY, URINARY    Patient complains of  urinary frequency, but denies any feelings of pelvic organ prolapse, incontinence, or dysuria. Exam of the exterior groin was unremarkable, with no tenderness on palpation. Lymph nodes were noted bilaterally, but these were small and mobile. Her abdominal exam was also unremarkable.  Based on the patient's lack of dysuria or tenderness on palpation, I have a low index of suspicion for a pathologic process at this point in time. The patient should return in the next few months for a more thorough pelvic exam and pap smear with another provider in this office. This recommendation was discussed with the patient and she agreed to it.       Rheumatoid factor positive    Other than hand pain no other joint active conditions      Bilateral hand pain    Hand pain is stable for now, has not worsened. Recommend that she continue with conservative management including bracing in neutral position and over the counter pain management such as Ibuprofen as needed.      Other Visit Diagnoses     Hyperlipidemia, unspecified  hyperlipidemia type       Relevant Medications   triamterene-hydrochlorothiazide (MAXZIDE-25) 37.5-25 MG tablet   atorvastatin (LIPITOR) 10 MG tablet       Meds ordered this encounter  Medications   metFORMIN (GLUCOPHAGE) 500 MG tablet    Sig: Take 2 tablets (1,000 mg total) by mouth daily with breakfast.    Dispense:  120 tablet    Refill:  3   triamterene-hydrochlorothiazide (MAXZIDE-25) 37.5-25 MG tablet    Sig: TAKE 1 TABLET BY MOUTH DAILY.    Dispense:  90 tablet    Refill:  2   atorvastatin (LIPITOR) 10 MG tablet    Sig: TAKE 1 TABLET (10 MG TOTAL) BY MOUTH DAILY. TO LOWER CHOLESTEROL    Dispense:  90 tablet    Refill:  1  38 minutes spent obtaining history and physical 10 minutes spent educating patient as to proper lifestyle management complex decision making multiple medications prescribed multiple problems assessed PAP smear to be obtained by provider in this  clinic next few months Follow-up: Return in about 2 months (around 05/28/2021).    [name], MD

## 2021-03-30 NOTE — Assessment & Plan Note (Addendum)
diabetes is not well-controlled. HbA1C was ordered and completed at the lab today. Urine microalbuminuria was also ordered and completed today to assess for any kidney complications from diabetes. Changed metformin to 1000 mg once daily. The patient expressed that she thinks she will find it easier to comply with this dosing regimen and will be able to more consistently take her medications.   We had an extensive discussion today on lifestyle management and diet to manage diabetes. I explained that aggressive lifestyle management may decrease her need for medications, which appeals to her. We discussed decreasing red meat consumption and increasing consumption of lean meats such as chicken and fish. We discussed filling half the plate with fresh vegetables and fruits that she enjoys. Discussed decreasing dietary sugar by stopping soda consumption, and adding flavoring to water such as lemon or berries to enhance taste, instead of drinking juice or adding sugar. She was provided with a diet handout today to reference.   Plan to meet with [name], clinical pharmacist, in 3 weeks to review diabetes management.  May need to add a GLP 1 inhibitor such as Trulicity

## 2021-03-30 NOTE — Patient Instructions (Addendum)
Refill of your blood pressure medication (Triamterine-HCTZ) sent to pharmacy.  Your metformin was sent to pharmacy. Change this to 1000 mg once per day.   Lipitor refill was sent to pharmacy.  We would like you to see [name], the clinical pharmacist in 3 weeks. He can adjust your diabetes medication to make sure it is working and your sugar is controlled.  Schedule a pap smear in next few months with one of the other providers at this office.   I have attached a handout on a good diet for your blood pressure on diabetes. Continue working on eating healthy foods - lots of fruits and vegetables - to help with your diabetes.   Can start Zyrtec (cetirizine) or other allergy medication to help with nasal congestion.   Lipid panel, A1C, and urine microalbumin for protein in urine will be checked in the lab today.  You declined the flu shot.   Return to see Dr. Joya Bryan in two months.

## 2021-03-30 NOTE — Assessment & Plan Note (Signed)
The patient is due for a lipid panel. Her last one was done February 2022. Continue atorvastatin 10 mg at present. Depending on results of lipid panel, further adjustment may be needed.

## 2021-03-30 NOTE — Assessment & Plan Note (Signed)
The patient was given antibiotics for sinusitis on 03/23/2021 by urgent care PA. I encouraged her to continue to take the full course of antibiotics as directed to ensure clearance of the infection.

## 2021-03-30 NOTE — Assessment & Plan Note (Signed)
Patient complains of urinary frequency, but denies any feelings of pelvic organ prolapse, incontinence, or dysuria. Exam of the exterior groin was unremarkable, with no tenderness on palpation. Lymph nodes were noted bilaterally, but these were small and mobile. Her abdominal exam was also unremarkable.  Based on the patient's lack of dysuria or tenderness on palpation, I have a low index of suspicion for a pathologic process at this point in time. The patient should return in the next few months for a more thorough pelvic exam and pap smear with another provider in this office. This recommendation was discussed with the patient and she agreed to it.

## 2021-03-30 NOTE — Assessment & Plan Note (Signed)
The patient's sinuses were clear on inspection, but with mild redness and irritation. Her throat irritation appears to be a result of her post-nasal drip. I recommended that she begin taking cetirizine (Zyrtec) for her allergy-type symptoms and post-nasal drip.

## 2021-03-31 ENCOUNTER — Telehealth: Payer: Self-pay

## 2021-03-31 LAB — LIPID PANEL
Chol/HDL Ratio: 3 ratio (ref 0.0–4.4)
Cholesterol, Total: 183 mg/dL (ref 100–199)
HDL: 62 mg/dL (ref >39)
LDL Chol Calc (NIH): 88 mg/dL (ref 0–99)
Triglycerides: 194 mg/dL — ABNORMAL HIGH (ref 0–149)
VLDL Cholesterol Cal: 33 mg/dL (ref 5–40)

## 2021-03-31 LAB — HEMOGLOBIN A1C
Est. average glucose Bld gHb Est-mCnc: 163 mg/dL
Hgb A1c MFr Bld: 7.3 % — ABNORMAL HIGH (ref 4.8–5.6)

## 2021-03-31 NOTE — Assessment & Plan Note (Signed)
Other than hand pain no other joint active conditions

## 2021-03-31 NOTE — Assessment & Plan Note (Signed)
Essential hypertension currently at goal plan to continue Maxide daily

## 2021-03-31 NOTE — Telephone Encounter (Signed)
Pt was called and vm was left. Information was sent to nurse pool and letter will be sent.

## 2021-03-31 NOTE — Telephone Encounter (Signed)
-----   Message from Elsie Stain, MD sent at 03/31/2021  5:55 AM EST ----- Let pt know cholesterol is at goal no change in atorvastatin.  Make sure she keeps appt with luke, A1C still high at 7.3

## 2021-04-22 ENCOUNTER — Other Ambulatory Visit: Payer: Self-pay

## 2021-04-22 ENCOUNTER — Encounter: Payer: Self-pay | Admitting: Physician Assistant

## 2021-04-22 ENCOUNTER — Ambulatory Visit: Payer: Self-pay | Attending: Physician Assistant | Admitting: Physician Assistant

## 2021-04-22 ENCOUNTER — Other Ambulatory Visit (HOSPITAL_COMMUNITY)
Admission: RE | Admit: 2021-04-22 | Discharge: 2021-04-22 | Disposition: A | Discharge status: Home / Self Care | Payer: Self-pay | Source: Ambulatory Visit | Attending: Physician Assistant | Admitting: Physician Assistant

## 2021-04-22 VITALS — BP 145/86 | HR 79 | Resp 16 | Wt 204.2 lb

## 2021-04-22 DIAGNOSIS — Z124 Encounter for screening for malignant neoplasm of cervix: Secondary | ICD-10-CM

## 2021-04-22 DIAGNOSIS — E1165 Type 2 diabetes mellitus with hyperglycemia: Secondary | ICD-10-CM

## 2021-04-22 LAB — GLUCOSE, POCT (MANUAL RESULT ENTRY): POC Glucose: 134 mg/dl — AB (ref 70–99)

## 2021-04-22 NOTE — Progress Notes (Signed)
Patient ID: [name], female   DOB: [birth year], 61 y.o.   MRN: [id number]   [name], is a 61 y.o. female  [id number]  [id number]  DOB - 1960-12-27  Chief Complaint  Patient presents with   Gynecologic Exam       Subjective:   [name] is a 61 y.o. female here today for a pap smear.  She had a hysterectomy at age 47.  Still thinks she has 1 ovary.  She does not think she has ever gone through menopause.    No problems updated.  ALLERGIES: No Known Allergies  PAST MEDICAL HISTORY: Past Medical History:  Diagnosis Date   Bilateral carpal tunnel syndrome    Hypercholesteremia    Hypertension    Hypertension    OAB (overactive bladder)    Rheumatoid arthritis ([place])     MEDICATIONS AT HOME: Prior to Admission medications   Medication Sig Start Date End Date Taking? Authorizing Provider  atorvastatin (LIPITOR) 10 MG tablet TAKE 1 TABLET (10 MG TOTAL) BY MOUTH DAILY. TO LOWER CHOLESTEROL 03/30/21 03/30/22 Yes Elsie Stain, MD  metFORMIN (GLUCOPHAGE) 500 MG tablet Take 2 tablets (1,000 mg total) by mouth daily with breakfast. 03/30/21 03/30/22 Yes Elsie Stain, MD  triamterene-hydrochlorothiazide (MAXZIDE-25) 37.5-25 MG tablet TAKE 1 TABLET BY MOUTH DAILY. 03/30/21 03/30/22 Yes Elsie Stain, MD    ROS: Neg HEENT Neg resp Neg cardiac Neg GI Neg GU Neg MS Neg psych Neg neuro  Objective:   Vitals:   04/22/21 1348  BP: (!) 145/86  Pulse: 79  Resp: 16  Weight: 204 lb 3.2 oz (92.6 kg)   Exam General appearance : Awake, alert, not in any distress. Speech Clear. Not toxic looking HEENT: Atraumatic and Normocephalic Chest: Good air entry bilaterally, CTAB.  No rales/rhonchi/wheezing CVS: S1 S2 regular, no murmurs.  GU-external gU wnl, rugae with some atrophy and dryness. Pap taken.  Bimanual unremarkable.   Neurology: Awake alert, and oriented X 3, CN II-XII intact, Non focal Skin: No Rash  Data Review Lab Results  Component Value  Date   HGBA1C 7.3 (H) 03/30/2021   HGBA1C 6.9 (H) 09/25/2020   HGBA1C 6.0 (A) 12/21/2019    Assessment & Plan   1. Type 2 diabetes mellitus with hyperglycemia, without long-term current use of insulin ([facility]) Followed by Dr Joya Gaskins.  Continue current regimen as of 03/2021.   - Glucose (CBG)  2. Cervical cancer screening - Cytology - PAP([place])    Patient have been counseled extensively about nutrition and exercise. Other issues discussed during this visit include: low cholesterol diet, weight control and daily exercise, foot care, annual eye examinations at Ophthalmology, importance of adherence with medications and regular follow-up. We also discussed long term complications of uncontrolled diabetes and hypertension.   Return for appt with Dr Joya Gaskins for DM in 2 months.  The patient was given clear instructions to go to ER or return to medical center if symptoms don't improve, worsen or new problems develop. The patient verbalized understanding. The patient was told to call to get lab results if they haven't heard anything in the next week.      Freeman Caldron, PA-C [facility] [place], [place]   04/22/2021, 1:59 PM

## 2021-04-23 ENCOUNTER — Telehealth: Payer: Self-pay | Admitting: Critical Care Medicine

## 2021-04-23 NOTE — Telephone Encounter (Signed)
[name] with [facility] called saying there were no orders put in to the system for the swab they received.  CB#  [phone]

## 2021-04-24 ENCOUNTER — Other Ambulatory Visit: Payer: Self-pay

## 2021-04-24 DIAGNOSIS — Z124 Encounter for screening for malignant neoplasm of cervix: Secondary | ICD-10-CM

## 2021-04-24 LAB — CYTOLOGY - PAP
Comment: NEGATIVE
Diagnosis: NEGATIVE
High risk HPV: NEGATIVE

## 2021-04-27 ENCOUNTER — Other Ambulatory Visit: Payer: Self-pay

## 2021-04-27 ENCOUNTER — Other Ambulatory Visit (HOSPITAL_COMMUNITY): Payer: Self-pay

## 2021-04-27 ENCOUNTER — Telehealth: Payer: Self-pay | Admitting: *Deleted

## 2021-04-27 ENCOUNTER — Other Ambulatory Visit: Payer: Self-pay | Admitting: Physician Assistant

## 2021-04-27 ENCOUNTER — Other Ambulatory Visit (HOSPITAL_BASED_OUTPATIENT_CLINIC_OR_DEPARTMENT_OTHER): Payer: Self-pay

## 2021-04-27 DIAGNOSIS — M79672 Pain in left foot: Secondary | ICD-10-CM

## 2021-04-27 DIAGNOSIS — M79671 Pain in right foot: Secondary | ICD-10-CM

## 2021-04-27 DIAGNOSIS — E1165 Type 2 diabetes mellitus with hyperglycemia: Secondary | ICD-10-CM

## 2021-04-27 LAB — CERVICOVAGINAL ANCILLARY ONLY
Bacterial Vaginitis (gardnerella): POSITIVE — AB
Candida Glabrata: NEGATIVE
Candida Vaginitis: NEGATIVE
Chlamydia: NEGATIVE
Comment: NEGATIVE
Comment: NEGATIVE
Comment: NEGATIVE
Comment: NEGATIVE
Comment: NEGATIVE
Comment: NORMAL
Neisseria Gonorrhea: NEGATIVE
Trichomonas: NEGATIVE

## 2021-04-27 MED ORDER — METRONIDAZOLE 500 MG PO TABS
500.0000 mg | ORAL_TABLET | Freq: Two times a day (BID) | ORAL | 0 refills | Status: DC
  Filled 2021-04-27 (×3): qty 14, 7d supply, fill #0

## 2021-04-27 NOTE — Telephone Encounter (Signed)
Podiatry referral was made

## 2021-04-27 NOTE — Telephone Encounter (Signed)
-----   Message from Argentina Donovan, [place] sent at 04/27/2021 12:13 PM EST ----- Your pap smear was normal.  Your vaginal swabs showed an imbalance in the vaginal bacteria.  No STD.  I sent a prescription to our pharmacy for you to take to get the bacteria balance in the vagina restored.  Follow up as planned.  Thanks, Freeman Caldron, PA-C

## 2021-04-27 NOTE — Telephone Encounter (Signed)
Patient called and advised about lab results.  She request a referral to podiatry.   Please advise.

## 2021-04-28 ENCOUNTER — Other Ambulatory Visit: Payer: Self-pay

## 2021-05-19 ENCOUNTER — Other Ambulatory Visit (HOSPITAL_BASED_OUTPATIENT_CLINIC_OR_DEPARTMENT_OTHER): Payer: Self-pay

## 2021-05-19 ENCOUNTER — Other Ambulatory Visit (HOSPITAL_COMMUNITY): Payer: Self-pay

## 2021-05-19 ENCOUNTER — Encounter (HOSPITAL_BASED_OUTPATIENT_CLINIC_OR_DEPARTMENT_OTHER): Payer: Self-pay | Admitting: Emergency Medicine

## 2021-05-19 ENCOUNTER — Emergency Department (HOSPITAL_BASED_OUTPATIENT_CLINIC_OR_DEPARTMENT_OTHER)
Admission: EM | Admit: 2021-05-19 | Discharge: 2021-05-19 | Disposition: A | Discharge status: Home / Self Care | Payer: No Typology Code available for payment source | Attending: Emergency Medicine | Admitting: Emergency Medicine

## 2021-05-19 ENCOUNTER — Other Ambulatory Visit: Payer: Self-pay

## 2021-05-19 DIAGNOSIS — Z79899 Other long term (current) drug therapy: Secondary | ICD-10-CM | POA: Diagnosis not present

## 2021-05-19 DIAGNOSIS — Z7984 Long term (current) use of oral hypoglycemic drugs: Secondary | ICD-10-CM | POA: Diagnosis not present

## 2021-05-19 DIAGNOSIS — E119 Type 2 diabetes mellitus without complications: Secondary | ICD-10-CM | POA: Insufficient documentation

## 2021-05-19 DIAGNOSIS — M546 Pain in thoracic spine: Secondary | ICD-10-CM | POA: Insufficient documentation

## 2021-05-19 DIAGNOSIS — I10 Essential (primary) hypertension: Secondary | ICD-10-CM | POA: Insufficient documentation

## 2021-05-19 DIAGNOSIS — M549 Dorsalgia, unspecified: Secondary | ICD-10-CM | POA: Diagnosis present

## 2021-05-19 LAB — URINALYSIS, ROUTINE W REFLEX MICROSCOPIC
Bilirubin Urine: NEGATIVE
Glucose, UA: NEGATIVE mg/dL
Hgb urine dipstick: NEGATIVE
Ketones, ur: NEGATIVE mg/dL
Leukocytes,Ua: NEGATIVE
Nitrite: NEGATIVE
Protein, ur: NEGATIVE mg/dL
Specific Gravity, Urine: 1.018 (ref 1.005–1.030)
pH: 5 (ref 5.0–8.0)

## 2021-05-19 MED ORDER — KETOROLAC TROMETHAMINE 60 MG/2ML IM SOLN
30.0000 mg | Freq: Once | INTRAMUSCULAR | Status: AC
  Administered 2021-05-19: 30 mg via INTRAMUSCULAR
  Filled 2021-05-19: qty 2

## 2021-05-19 MED ORDER — METHOCARBAMOL 500 MG PO TABS
750.0000 mg | ORAL_TABLET | Freq: Once | ORAL | Status: AC
  Administered 2021-05-19: 750 mg via ORAL
  Filled 2021-05-19: qty 2

## 2021-05-19 MED ORDER — METHOCARBAMOL 500 MG PO TABS
500.0000 mg | ORAL_TABLET | Freq: Three times a day (TID) | ORAL | 0 refills | Status: AC | PRN
  Filled 2021-05-19: qty 12, 4d supply, fill #0

## 2021-05-19 NOTE — ED Triage Notes (Signed)
Pt reports mid right side  back pain for 1 week. Denies any urinary symptoms, pain unrelieved by tylenol at home and worse with twisting movement. Pt denies known injury. ?

## 2021-05-19 NOTE — Discharge Instructions (Signed)
Continue ibuprofen for relief of your pain.  Additionally, a prescription for a muscle relaxer was sent to your pharmacy.  Take this as needed.  Continue movement but avoid activities that worsen pain.  Return to the emergency department if you experience worsening severity of symptoms. ?

## 2021-05-19 NOTE — ED Notes (Signed)
Pt reports she tripped and fell in the bathroom at home about 1 week ago.  Pt states her ankle was sore for a few days but denies any immediate back pain from the fall. Pt unable to recall if back pain started in the day/s immediately following her fall. ?

## 2021-05-19 NOTE — ED Provider Notes (Signed)
?[facility] ?Provider Note ? ? ?CSN: [id number] ?Arrival date & time: 05/19/21  4709 ? ?  ? ?History ? ?Chief Complaint  ?Patient presents with  ? Back Pain  ? ? ?[name] is a 61 y.o. female. ? ? ?Back Pain ?Patient presents for back pain. 1 week ago, she did have a fall and had an ankle injury which has resolved.  She denies any back pain immediately following this fall.  Back pain has occurred gradually over the past several days.  It is located on the right side of her thoracic back.  Pain is worsened with movement.  It also seems to be worse when she lays down at night.  She denies any pain with deep inspiration.  She has been treating pain at home with Tylenol with minimal relief.  She denies any recent trauma.  Medical history includes HLD, HTN, rheumatoid arthritis, GERD, T2DM. ?  ? ?Home Medications ?Prior to Admission medications   ?Medication Sig Start Date End Date Taking? Authorizing Provider  ?methocarbamol (ROBAXIN) 500 MG tablet Take 1 tablet (500 mg total) by mouth every 8 (eight) hours as needed for up to 4 days for muscle spasms. 05/19/21 05/23/21 Yes Godfrey Pick, MD  ?atorvastatin (LIPITOR) 10 MG tablet TAKE 1 TABLET (10 MG TOTAL) BY MOUTH DAILY. TO LOWER CHOLESTEROL 03/30/21 03/30/22  Elsie Stain, MD  ?metFORMIN (GLUCOPHAGE) 500 MG tablet Take 2 tablets (1,000 mg total) by mouth daily with breakfast. 03/30/21 03/30/22  Elsie Stain, MD  ?metroNIDAZOLE (FLAGYL) 500 MG tablet Take 1 tablet (500 mg total) by mouth 2 (two) times daily. 04/27/21   Argentina Donovan, PA-C  ?triamterene-hydrochlorothiazide (MAXZIDE-25) 37.5-25 MG tablet TAKE 1 TABLET BY MOUTH DAILY. 03/30/21 03/30/22  Elsie Stain, MD  ?   ? ?Allergies    ?Patient has no known allergies.   ? ?Review of Systems   ?Review of Systems  ?Musculoskeletal:  Positive for back pain.  ?All other systems reviewed and are negative. ? ?Physical Exam ?Updated Vital Signs ?BP (!) 152/74   Pulse 63   Temp  98.1 ?F (36.7 ?C) (Oral)   Resp 16   Ht '5\' 11"'$  (1.803 m)   Wt 101.2 kg   SpO2 100%   BMI 31.10 kg/m?  ?Physical Exam ?Vitals and nursing note reviewed.  ?Constitutional:   ?   General: She is not in acute distress. ?   Appearance: Normal appearance. She is well-developed and normal weight. She is not ill-appearing, toxic-appearing or diaphoretic.  ?HENT:  ?   Head: Normocephalic and atraumatic.  ?   Right Ear: External ear normal.  ?   Left Ear: External ear normal.  ?   Nose: Nose normal.  ?Eyes:  ?   Extraocular Movements: Extraocular movements intact.  ?   Conjunctiva/sclera: Conjunctivae normal.  ?Cardiovascular:  ?   Rate and Rhythm: Normal rate and regular rhythm.  ?   Heart sounds: No murmur heard. ?Pulmonary:  ?   Effort: Pulmonary effort is normal. No respiratory distress.  ?   Breath sounds: Normal breath sounds. No wheezing, rhonchi or rales.  ?Chest:  ?   Chest wall: No tenderness.  ?Abdominal:  ?   General: There is no distension.  ?   Palpations: Abdomen is soft.  ?Musculoskeletal:     ?   General: No swelling. Normal range of motion.  ?   Cervical back: Normal range of motion. No rigidity.  ?   Right lower leg:  No edema.  ?   Left lower leg: No edema.  ?Skin: ?   General: Skin is warm and dry.  ?   Capillary Refill: Capillary refill takes less than 2 seconds.  ?   Coloration: Skin is not jaundiced or pale.  ?   Findings: No bruising.  ?Neurological:  ?   General: No focal deficit present.  ?   Mental Status: She is alert and oriented to person, place, and time.  ?   Cranial Nerves: No cranial nerve deficit.  ?   Sensory: No sensory deficit.  ?   Motor: No weakness.  ?   Coordination: Coordination normal.  ?Psychiatric:     ?   Mood and Affect: Mood normal.     ?   Behavior: Behavior normal.     ?   Thought Content: Thought content normal.     ?   Judgment: Judgment normal.  ? ? ?ED Results / Procedures / Treatments   ?Labs ?(all labs ordered are listed, but only abnormal results are  displayed) ?Labs Reviewed  ?URINALYSIS, ROUTINE W REFLEX MICROSCOPIC  ? ? ?EKG ?None ? ?Radiology ?No results found. ? ?Procedures ?Procedures  ? ? ?Medications Ordered in ED ?Medications  ?ketorolac (TORADOL) injection 30 mg (30 mg Intramuscular Given 05/19/21 1022)  ?methocarbamol (ROBAXIN) tablet 750 mg (750 mg Oral Given 05/19/21 1022)  ? ? ?ED Course/ Medical Decision Making/ A&P ?  ?                        ?Medical Decision Making ?Amount and/or Complexity of Data Reviewed ?Labs: ordered. ? ?Risk ?Prescription drug management. ? ? ?61 year old female presenting for several days of back pain.  Pain is located in the right side musculature of thoracic area.  It is worsened with movements and seems to be worse at night when she is laying down.  She has taken Tylenol at home with minimal relief.  She has not tried any other therapies prior to arrival.  On arrival, patient is well-appearing.  Vital signs are notable for hypertension only.  There is no areas of bruising, swelling, or crepitus to her area of concern.  Patient is able to breathe without any discomfort.  Her lungs are clear to auscultation.  There is no significant tenderness on exam.  History and exam is consistent with a muscle inflammation/tightness.  Toradol and Robaxin were ordered for symptomatic relief.  On reassessment, patient had resolution of her pain.  Given symptom resolution, no imaging to be obtained today.  She was advised to continue pain management at home and to return to the ED for any worsening or persistent symptoms.  Robaxin was prescribed.  Patient was discharged in good condition. ? ? ? ? ? ? ? ?Final Clinical Impression(s) / ED Diagnoses ?Final diagnoses:  ?Acute right-sided thoracic back pain  ? ? ?Rx / DC Orders ?ED Discharge Orders   ? ?      Ordered  ?  methocarbamol (ROBAXIN) 500 MG tablet  Every 8 hours PRN       ? 05/19/21 1136  ? ?  ?  ? ?  ? ? ?  ?Godfrey Pick, MD ?05/20/21 [phone] ? ?

## 2021-05-21 ENCOUNTER — Emergency Department (HOSPITAL_BASED_OUTPATIENT_CLINIC_OR_DEPARTMENT_OTHER)
Admission: EM | Admit: 2021-05-21 | Discharge: 2021-05-21 | Disposition: A | Discharge status: Home / Self Care | Payer: 59 | Attending: Emergency Medicine | Admitting: Emergency Medicine

## 2021-05-21 ENCOUNTER — Other Ambulatory Visit (HOSPITAL_BASED_OUTPATIENT_CLINIC_OR_DEPARTMENT_OTHER): Payer: Self-pay

## 2021-05-21 ENCOUNTER — Encounter (HOSPITAL_BASED_OUTPATIENT_CLINIC_OR_DEPARTMENT_OTHER): Payer: Self-pay

## 2021-05-21 ENCOUNTER — Other Ambulatory Visit (HOSPITAL_COMMUNITY): Payer: Self-pay

## 2021-05-21 ENCOUNTER — Other Ambulatory Visit: Payer: Self-pay

## 2021-05-21 DIAGNOSIS — M546 Pain in thoracic spine: Secondary | ICD-10-CM | POA: Diagnosis present

## 2021-05-21 MED ORDER — LIDOCAINE 5 % EX PTCH
1.0000 | MEDICATED_PATCH | CUTANEOUS | 0 refills | Status: DC
  Filled 2021-05-21 (×2): qty 6, 6d supply, fill #0

## 2021-05-21 MED ORDER — LIDOCAINE 5 % EX PTCH
1.0000 | MEDICATED_PATCH | CUTANEOUS | Status: DC
  Administered 2021-05-21: 1 via TRANSDERMAL
  Filled 2021-05-21: qty 1

## 2021-05-21 NOTE — Discharge Instructions (Addendum)
Please continue to take your prescribed Robaxin.  I would also recommend hot showers/baths as well as light stretching.  I have also prescribed you Lidoderm patches that you can apply to the painful region on your back as needed.  You can also continue to take Tylenol as needed for your pain throughout the day.  Please follow the instructions on the bottle.  I would recommend not exceeding 3000 mg of Tylenol per day. ? ?Please follow-up with your regular doctor regarding your symptoms.  If you develop any new or worsening symptoms please come back to the emergency department for reevaluation. ?

## 2021-05-21 NOTE — ED Provider Notes (Signed)
?[facility] ?Provider Note ? ? ?CSN: [id number] ?Arrival date & time: 05/21/21  1216 ? ?  ? ?History ? ?Chief Complaint  ?Patient presents with  ? Back Pain  ? ? ?XITLALLY MOONEYHAM is a 61 y.o. female. ? ?HPI ?Patient is a 61 year old female who presents to the emergency department for reevaluation of right-sided back pain.  Patient was initially seen 2 days ago with the same complaint and discharged on a course of Robaxin which she has been taking with mild short-term relief.  She states that her symptoms have persisted so she came back in today for reevaluation.  Denies any new falls or trauma.  No numbness, weakness, chest pain, abdominal pain, shortness of breath, pain with deep inspiration, bowel/bladder incontinence, dysuria, urinary frequency.  She does note that she initially had a fall last week in which she injured her ankle.  This has since resolved.  She states that she gradually began having right-sided back pain after the fall.  Over the past few days it worsened.  States it is worse with movement as well as twisting of the torso.   ?  ? ?Home Medications ?Prior to Admission medications   ?Medication Sig Start Date End Date Taking? Authorizing Provider  ?atorvastatin (LIPITOR) 10 MG tablet TAKE 1 TABLET (10 MG TOTAL) BY MOUTH DAILY. TO LOWER CHOLESTEROL 03/30/21 03/30/22 Yes Elsie Stain, MD  ?lidocaine (LIDODERM) 5 % Place 1 patch onto the skin daily. Remove & Discard patch within 12 hours or as directed by MD 05/21/21  Yes Rayna Sexton, PA-C  ?metFORMIN (GLUCOPHAGE) 500 MG tablet Take 2 tablets (1,000 mg total) by mouth daily with breakfast. 03/30/21 03/30/22 Yes Elsie Stain, MD  ?methocarbamol (ROBAXIN) 500 MG tablet Take 1 tablet (500 mg total) by mouth every 8 (eight) hours as needed for up to 4 days for muscle spasms. 05/19/21 05/23/21 Yes Godfrey Pick, MD  ?triamterene-hydrochlorothiazide (MAXZIDE-25) 37.5-25 MG tablet TAKE 1 TABLET BY MOUTH DAILY. 03/30/21 03/30/22  Yes Elsie Stain, MD  ?metroNIDAZOLE (FLAGYL) 500 MG tablet Take 1 tablet (500 mg total) by mouth 2 (two) times daily. ?Patient not taking: Reported on 05/21/2021 04/27/21   Argentina Donovan, PA-C  ?   ? ?Allergies    ?Patient has no known allergies.   ? ?Review of Systems   ?Review of Systems  ?Constitutional:  Negative for fever.  ?Respiratory:  Negative for shortness of breath.   ?Cardiovascular:  Negative for chest pain.  ?Gastrointestinal:  Negative for abdominal pain.  ?Genitourinary:  Negative for decreased urine volume, difficulty urinating, dysuria, frequency and urgency.  ?Musculoskeletal:  Positive for back pain and myalgias.  ?Neurological:  Negative for weakness and numbness.  ? ?Physical Exam ?Updated Vital Signs ?BP 136/81 (BP Location: Right Arm)   Pulse 63   Temp 98.2 ?F (36.8 ?C)   Resp 16   Ht '5\' 11"'$  (1.803 m)   Wt 101.2 kg   SpO2 100%   BMI 31.10 kg/m?  ?Physical Exam ?Vitals and nursing note reviewed.  ?Constitutional:   ?   General: She is not in acute distress. ?   Appearance: She is well-developed.  ?HENT:  ?   Head: Normocephalic and atraumatic.  ?   Right Ear: External ear normal.  ?   Left Ear: External ear normal.  ?Eyes:  ?   General: No scleral icterus.    ?   Right eye: No discharge.     ?   Left eye: No  discharge.  ?   Conjunctiva/sclera: Conjunctivae normal.  ?Neck:  ?   Trachea: No tracheal deviation.  ?Cardiovascular:  ?   Rate and Rhythm: Normal rate and regular rhythm.  ?   Pulses: Normal pulses.  ?   Heart sounds: Normal heart sounds. No murmur heard. ?  No friction rub. No gallop.  ?Pulmonary:  ?   Effort: Pulmonary effort is normal. No respiratory distress.  ?   Breath sounds: Normal breath sounds. No stridor. No wheezing, rhonchi or rales.  ?Chest:  ?   Chest wall: No tenderness.  ?Abdominal:  ?   General: Abdomen is flat. There is no distension.  ?   Palpations: Abdomen is soft.  ?   Tenderness: There is no abdominal tenderness.  ?Musculoskeletal:     ?   General:  Tenderness present. No swelling or deformity.  ?   Cervical back: Normal range of motion and neck supple.  ?   Comments: Moderate TTP noted to the right thoracic musculature.  No midline C, T, or L-spine tenderness.  No overlying skin changes or zoster-like rash.  Pain appears to be reproducible and worsens when patient twists the torso.  ?Skin: ?   General: Skin is warm and dry.  ?   Findings: No rash.  ?Neurological:  ?   General: No focal deficit present.  ?   Mental Status: She is alert and oriented to person, place, and time.  ?   Cranial Nerves: Cranial nerve deficit: no gross deficits.  ?   Comments: A&O x3.  Moving all 4 extremities with ease.  Distal sensation intact.  No gross deficits.  ? ? ?ED Results / Procedures / Treatments   ?Labs ?(all labs ordered are listed, but only abnormal results are displayed) ?Labs Reviewed - No data to display ? ?EKG ?None ? ?Radiology ?No results found. ? ?Procedures ?Procedures  ? ?Medications Ordered in ED ?Medications  ?lidocaine (LIDODERM) 5 % 1 patch (has no administration in time range)  ? ? ?ED Course/ Medical Decision Making/ A&P ?  ?                        ?Medical Decision Making ?Risk ?Prescription drug management. ? ?Patient is a 61 year old female who presents to the emergency department for reevaluation of right thoracic back pain.  Pain began after falling last week. ? ?On my exam patient has moderate tenderness to the right thoracic musculature.  Symmetric rise and fall the chest.  Lungs are clear to auscultation bilaterally.  No midline spine pain.  Neurovascularly intact in all 4 extremities and appears to be moving all 4 extremities with ease.  No gross deficits.  No overlying zoster-like rash.  No red flags. ? ?Patient does note that she has been getting relief with Robaxin but states that her pain is continuing to return after the Robaxin wears off.  Discussed discontinuing this medication and trying Flexeril but she declined.  Feel that this is  reasonable given she has only taken 2-3 doses of Robaxin.  Also recommended that she continue to take Tylenol for pain.  We discussed dosing.  Will discharge on a course of Lidoderm patches as well.  She was given a patch in the emergency department.  Discussed light stretching as well as warm baths/showers.  PCP follow-up. ? ?Patient appears stable for discharge at this time and she is agreeable.  We discussed return precautions.  Her questions were answered and she was amicable  at the time of discharge. ?Final Clinical Impression(s) / ED Diagnoses ?Final diagnoses:  ?Acute right-sided thoracic back pain  ? ?Rx / DC Orders ?ED Discharge Orders   ? ?      Ordered  ?  lidocaine (LIDODERM) 5 %  Every 24 hours       ? 05/21/21 1434  ? ?  ?  ? ?  ? ? ?  ?Rayna Sexton, PA-C ?05/21/21 1444 ? ?  ?Malvin Johns, MD ?05/21/21 1447 ? ?

## 2021-05-21 NOTE — ED Triage Notes (Signed)
Pt presents with ongoing Right side back apin x1.5 weeks. Seen here two days ago for the same. Prescribed a muscle relaxer/ pt reports she is taking it as prescribed but "pain goes away and keeps returning"  ?Pt denies any injuries, or urinary symptoms at this time  ?

## 2021-05-22 ENCOUNTER — Telehealth: Payer: Self-pay

## 2021-05-22 NOTE — Telephone Encounter (Signed)
Called pt and left a vm about getting a hosp. F/U Appointment  ?

## 2021-05-26 ENCOUNTER — Emergency Department (HOSPITAL_BASED_OUTPATIENT_CLINIC_OR_DEPARTMENT_OTHER)
Admission: EM | Admit: 2021-05-26 | Discharge: 2021-05-26 | Disposition: A | Discharge status: Home / Self Care | Payer: No Typology Code available for payment source | Attending: Emergency Medicine | Admitting: Emergency Medicine

## 2021-05-26 ENCOUNTER — Other Ambulatory Visit: Payer: Self-pay

## 2021-05-26 ENCOUNTER — Other Ambulatory Visit (HOSPITAL_BASED_OUTPATIENT_CLINIC_OR_DEPARTMENT_OTHER): Payer: Self-pay

## 2021-05-26 ENCOUNTER — Emergency Department (HOSPITAL_BASED_OUTPATIENT_CLINIC_OR_DEPARTMENT_OTHER): Payer: No Typology Code available for payment source | Admitting: Radiology

## 2021-05-26 DIAGNOSIS — M546 Pain in thoracic spine: Secondary | ICD-10-CM | POA: Diagnosis present

## 2021-05-26 MED ORDER — KETOROLAC TROMETHAMINE 30 MG/ML IJ SOLN
30.0000 mg | Freq: Once | INTRAMUSCULAR | Status: AC
  Administered 2021-05-26: 30 mg via INTRAMUSCULAR
  Filled 2021-05-26: qty 1

## 2021-05-26 MED ORDER — KETOROLAC TROMETHAMINE 10 MG PO TABS
10.0000 mg | ORAL_TABLET | Freq: Four times a day (QID) | ORAL | 0 refills | Status: DC | PRN
  Filled 2021-05-26: qty 20, 30d supply, fill #0

## 2021-05-26 NOTE — Discharge Instructions (Addendum)
Your x-ray was negative for rib fracture.  I sent you in a prescription of the Toradol as this seems to have helped your pain while you are here in the emergency department.  I have also provided you a referral to a physical therapist since your symptoms have been ongoing for about 3 weeks now.  Unfortunately, we are limited as to what we can do here in the emergency department for muscular back pain, and the therapies we have given you over the last few weeks do not seem to have improved your symptoms.  I think the best option is for you to see a physical therapist who can help manage your back pain symptoms.  ? ?I also recommend buying a heating pad at a drugstore or [employer] and putting it on the painful area which can help loosen those muscles.  You can also buy a massage ball, or even something like a tennis ball, and put it on the area while you are laying on the floor or against a wall and gently massage the sore muscles which will help release some of that tension.  I hope you feel better soon explanation point ?

## 2021-05-26 NOTE — ED Triage Notes (Addendum)
Patient arrives with complaints of lower back pain (right side & middle). Patient states she has been seen twice for the same with no relief with prescribed meds and patches.  ? ?Patient reports that no imaging has been completed on her back. Tearful in triage and rating pain a 10/10.  ?

## 2021-05-26 NOTE — ED Notes (Signed)
Pt is being seen again for back pain, has taken all meds that have been prescribed with no relief.  Pt does appear to be uncomfortable ?

## 2021-05-26 NOTE — ED Provider Notes (Signed)
?[facility] ?Provider Note ? ? ?CSN: [id number] ?Arrival date & time: 05/26/21  1219 ? ?  ? ?History ? ?Chief Complaint  ?Patient presents with  ? Back Pain  ? ? ?[name] is a 61 y.o. female returns again to the ED for evaluation of right-sided thoracic back pain that has been ongoing for 3 weeks.  Patient has been to the emergency department twice before now for the same complaint and noticed that the medications previously prescribed have not helped her.  She is so far attempted lidocaine patches, muscle relaxers and over-the-counter medications and remains uncomfortable.  She notes that she has not tried other supportive measures including heat, massage or stretching.  She denies numbness and tingling.  She denies chest pain and abdominal pain and she has no other complaints. ? ? ?Back Pain ? ?  ? ?Home Medications ?Prior to Admission medications   ?Medication Sig Start Date End Date Taking? Authorizing Provider  ?atorvastatin (LIPITOR) 10 MG tablet TAKE 1 TABLET (10 MG TOTAL) BY MOUTH DAILY. TO LOWER CHOLESTEROL 03/30/21 03/30/22 Yes Elsie Stain, MD  ?ketorolac (TORADOL) 10 MG tablet Take 1 tablet (10 mg total) by mouth every 6 (six) hours as needed. 05/26/21  Yes Kathe Becton R, PA-C  ?lidocaine (LIDODERM) 5 % Place 1 patch onto the skin daily. Remove & Discard patch within 12 hours or as directed by MD 05/21/21  Yes Rayna Sexton, PA-C  ?metFORMIN (GLUCOPHAGE) 500 MG tablet Take 2 tablets (1,000 mg total) by mouth daily with breakfast. 03/30/21 03/30/22 Yes Elsie Stain, MD  ?triamterene-hydrochlorothiazide (MAXZIDE-25) 37.5-25 MG tablet TAKE 1 TABLET BY MOUTH DAILY. 03/30/21 03/30/22 Yes Elsie Stain, MD  ?metroNIDAZOLE (FLAGYL) 500 MG tablet Take 1 tablet (500 mg total) by mouth 2 (two) times daily. ?Patient not taking: Reported on 05/21/2021 04/27/21   Argentina Donovan, PA-C  ?   ? ?Allergies    ?Patient has no known allergies.   ? ?Review of Systems   ?Review  of Systems  ?Musculoskeletal:  Positive for back pain.  ? ?Physical Exam ?Updated Vital Signs ?BP (!) 142/69 (BP Location: Right Arm)   Pulse 63   Temp 98.3 ?F (36.8 ?C)   Resp 18   SpO2 98%  ?Physical Exam ?Vitals and nursing note reviewed.  ?Constitutional:   ?   General: She is not in acute distress. ?   Appearance: She is not ill-appearing.  ?HENT:  ?   Head: Atraumatic.  ?Eyes:  ?   Conjunctiva/sclera: Conjunctivae normal.  ?Cardiovascular:  ?   Rate and Rhythm: Normal rate and regular rhythm.  ?   Pulses: Normal pulses.  ?   Heart sounds: No murmur heard. ?Pulmonary:  ?   Effort: Pulmonary effort is normal. No respiratory distress.  ?   Breath sounds: Normal breath sounds.  ?Abdominal:  ?   General: Abdomen is flat. There is no distension.  ?   Palpations: Abdomen is soft.  ?   Tenderness: There is no abdominal tenderness.  ?Musculoskeletal:     ?   General: Normal range of motion.  ?   Cervical back: Normal range of motion.  ?   Comments: T-spine and L-spine nontender to palpation at midline. ?Patient moves all extremities without difficulty. ?All joints supple and easily movable, no erythema, swelling or palpable deformity, all compartments soft. ? ?Patient has mild tenderness to palpation of the right thoracic musculature without palpable deformities ?  ?Skin: ?   General: Skin  is warm and dry.  ?   Capillary Refill: Capillary refill takes less than 2 seconds.  ?Neurological:  ?   General: No focal deficit present.  ?   Mental Status: She is alert.  ?Psychiatric:     ?   Mood and Affect: Mood normal.  ? ? ?ED Results / Procedures / Treatments   ?Labs ?(all labs ordered are listed, but only abnormal results are displayed) ?Labs Reviewed - No data to display ? ?EKG ?None ? ?Radiology ?DG Ribs Unilateral W/Chest Right ? ?Result Date: 05/26/2021 ?CLINICAL DATA:  Right upper back pain. EXAM: RIGHT RIBS AND CHEST - 3+ VIEW COMPARISON:  September 13, 2018. FINDINGS: No fracture or other bone lesions are seen  involving the ribs. There is no evidence of pneumothorax or pleural effusion. Both lungs are clear. Heart size and mediastinal contours are within normal limits. IMPRESSION: Negative. Electronically Signed   By: Marijo Conception M.D.   On: 05/26/2021 14:28   ? ?Procedures ?Procedures  ? ? ?Medications Ordered in ED ?Medications  ?ketorolac (TORADOL) 30 MG/ML injection 30 mg (30 mg Intramuscular Given 05/26/21 1423)  ? ? ?ED Course/ Medical Decision Making/ A&P ?  ?                        ?Medical Decision Making ?Amount and/or Complexity of Data Reviewed ?Radiology: ordered. ? ?Risk ?Prescription drug management. ? ? ?History:  ?Per HPI ?Social determinants of health: [facility] pt ? ?Initial impression: ? ?This patient presents to the ED for concern of ongoing right thoracic back pain, this involves an extensive number of treatment options, and is a complaint that carries with it a high risk of complications and morbidity.    ?Patient is resting on bed in no acute distress, nontoxic-appearing.  Vitals are normal.  Physical exam without midline tenderness.  She has mild focal tenderness palpation of the right thoracic musculature.  Given that this is her third visit, will obtain chest x-ray to rule out rib fracture and trial Toradol for pain relief.  Patient minimal to plan ? ? ?Imaging Studies ordered: ? ?I ordered imaging studies including  ?Right-sided chest x-ray and rib study negative for acute fractures ?I independently visualized and interpreted imaging and I agree with the radiologist interpretation.  ? ? ?Medicines ordered and prescription drug management: ? ?I ordered medication including: ?Toradol 30 mg IM ?Reevaluation of the patient after these medicines showed that the patient improved ?I have reviewed the patients home medicines and have made adjustments as needed ? ? ?ED Course: ?Patient had mild improvement in her symptoms with the Toradol injection.  X-ray was normal.  We discussed with her.  We also  had a long conversation about the limitations of the emergency department in regards to ongoing muscular pain and that she needs to set up an appointment with her primary care doctor.  She notes that she was unable to get appointment for quite some time which is why she returned to the emergency department.  I discussed at home supportive measures to help with her pain including heat pads, stretching and self massage.  I also recommended that she see a physical therapist which may expedite her healing and provide additional guidance on stretching and exercises to improve her back pain.  Patient is understanding.  Amenable to plan. ? ?Disposition: ? ?After consideration of the diagnostic results, physical exam, history and the patients response to treatment feel that the patent would benefit  from discharge.   ?Right-sided thoracic back pain: Prescription for Toradol was sent in for patient given that she had some improvement here in the emergency department with this medication.  All questions were asked and answered and she was discharged home in good condition. ? ? ?Final Clinical Impression(s) / ED Diagnoses ?Final diagnoses:  ?Right-sided thoracic back pain, unspecified chronicity  ? ? ?Rx / DC Orders ?ED Discharge Orders   ? ?      Ordered  ?  ketorolac (TORADOL) 10 MG tablet  Every 6 hours PRN       ? 05/26/21 1454  ? ?  ?  ? ?  ? ? ?  ?Tonye Pearson, [place] ?05/26/21 1623 ? ?  ?Regan Lemming, MD ?05/26/21 1910 ? ?

## 2021-05-26 NOTE — ED Notes (Signed)
Patient transported to X-ray 

## 2021-05-30 NOTE — Progress Notes (Deleted)
? ?Established Patient Office Visit ? ?Subjective:  ?Patient ID: [name], female    DOB: 24-Jun-1960  Age: 61 y.o. MRN: [id number] ? ?CC:  ?No chief complaint on file. ? ? ?HPI ?03/30/21 ? ?[name] presents for follow up. She has previously seen other providers in this practice, most recently Freeman Caldron PA-C in July 2022. She has diagnoses of hypertension, hyperlipidemia, and Type 2 Diabetes all of which are being treated medically at this time. 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  ? ?She was seen at urgent care 1 week ago and diagnosed with bacterial sinusitis. She was prescribed amoxicillin clavulanate. She took this until her symptoms started to improve. She still has some congestion and a scratchy feeling in the throat, but she says that the amount of congestion she has today is typical for her.  ? ?The patient's POC blood glucose was 200 mg/dL. She admits that she has not been taking her blood sugars at home, and that she often forgets to take her metformin. The patient admits that her diet includes soda, juice, and steak, with very few vegetables and fruits.  ? ?The patient's blood pressure is at goal today at 130/84. She says she usually takes her Triamterine-HCTZ and atorvastatin, but she does not always remember to. She is interested in decreasing the number of medications that she has to take.  ? ?The patient has complaint today of a feeling of "heaviness" in her left groin area. She finds it mostly bothersome. She also experiences frequent urination. She states that she will go to urinate, feel like she has finished, and then when she stands up, has the feeling that she needs to go again. She denies any pain or burning sensation with urination.  ? ?The patient reports continued soreness and pain in her hand, mostly in her right palm. She had a workup done with rheumatology in 02/2020, after a positive test for rheumatoid factor. At that visit, rheumatoid  factor was retested and found negative. X-rays showed osteoarthritis in both hands. It was recommended that she continue follow up with primary care unless symptoms worsen. ? ?The patient is due for A1C screening, urine microalbuminuria, and a lipid panel. She had a colonoscopy done on 11/17/2020 that showed diverticulosis of the colon and a benign polyp in the ascending colon. She is due for a pap smear, and would like to schedule with one of the female providers at this office. She is not interested in receiving the flu shot today.   ? ?3/27 ?Eye ur alb back pain in ED 3X this month ?Pap done neg ? Essential hypertension  ?  Essential hypertension currently at goal plan to continue Maxide daily ?  ?  ? Relevant Medications  ? triamterene-hydrochlorothiazide (MAXZIDE-25) 37.5-25 MG tablet  ? atorvastatin (LIPITOR) 10 MG tablet  ?  ? Respiratory  ? Allergic rhinitis  ?  The patient's sinuses were clear on inspection, but with mild redness and irritation. Her throat irritation appears to be a result of her post-nasal drip. I recommended that she begin taking cetirizine (Zyrtec) for her allergy-type symptoms and post-nasal drip.  ?  ?  ? Acute non-recurrent maxillary sinusitis  ?  The patient was given antibiotics for sinusitis on 03/23/2021 by urgent care PA. I encouraged her to continue to take the full course of antibiotics as directed to ensure clearance of the infection.  ?  ?  ?  ? Endocrine  ? Type 2  diabetes mellitus with hyperglycemia, without long-term current use of insulin ([place]) - Primary  ?  diabetes is not well-controlled. HbA1C was ordered and completed at the lab today. Urine microalbuminuria was also ordered and completed today to assess for any kidney complications from diabetes. Changed metformin to 1000 mg once daily. The patient expressed that she thinks she will find it easier to comply with this dosing regimen and will be able to more consistently take her medications.  ? ?We had an extensive  discussion today on lifestyle management and diet to manage diabetes. I explained that aggressive lifestyle management may decrease her need for medications, which appeals to her. We discussed decreasing red meat consumption and increasing consumption of lean meats such as chicken and fish. We discussed filling half the plate with fresh vegetables and fruits that she enjoys. Discussed decreasing dietary sugar by stopping soda consumption, and adding flavoring to water such as lemon or berries to enhance taste, instead of drinking juice or adding sugar. She was provided with a diet handout today to reference.  ? ?Plan to meet with [name], clinical pharmacist, in 3 weeks to review diabetes management.  May need to add a GLP 1 inhibitor such as Trulicity ?  ?  ? Relevant Medications  ? metFORMIN (GLUCOPHAGE) 500 MG tablet  ? atorvastatin (LIPITOR) 10 MG tablet  ? Other Relevant Orders  ? Microalbumin / creatinine urine ratio  ? HgB A1c  ? Hemoglobin A1c  ? POCT glucose (manual entry) (Completed)  ? Hyperlipidemia associated with type 2 diabetes mellitus ([place])  ?  The patient is due for a lipid panel. Her last one was done February 2022. Continue atorvastatin 10 mg at present. Depending on results of lipid panel, further adjustment may be needed. ?  ?  ? Relevant Medications  ? metFORMIN (GLUCOPHAGE) 500 MG tablet  ? triamterene-hydrochlorothiazide (MAXZIDE-25) 37.5-25 MG tablet  ? atorvastatin (LIPITOR) 10 MG tablet  ? Other Relevant Orders  ? Lipid panel  ?  ? Other  ? FREQUENCY, URINARY  ?  Patient complains of urinary frequency, but denies any feelings of pelvic organ prolapse, incontinence, or dysuria. Exam of the exterior groin was unremarkable, with no tenderness on palpation. Lymph nodes were noted bilaterally, but these were small and mobile. Her abdominal exam was also unremarkable. ? ?Based on the patient's lack of dysuria or tenderness on palpation, I have a low index of suspicion for a pathologic process at  this point in time. The patient should return in the next few months for a more thorough pelvic exam and pap smear with another provider in this office. This recommendation was discussed with the patient and she agreed to it.  ?  ?  ? Rheumatoid factor positive  ?  Other than hand pain no other joint active conditions ?  ?  ? Bilateral hand pain  ?  Hand pain is stable for now, has not worsened. Recommend that she continue with conservative management including bracing in neutral position and over the counter pain management such as Ibuprofen as needed. ?  ?  ? ?Other Visit Diagnoses   ? ? Hyperlipidemia, unspecified hyperlipidemia type      ? Relevant Medications  ? triamterene-hydrochlorothiazide (MAXZIDE-25) 37.5-25 MG tablet  ? atorvastatin (LIPITOR) 10 MG tablet  ? ?  ?Last ED visit 3/16: ?Patient is a 61 year old female who presents to the emergency department for reevaluation of right thoracic back pain.  Pain began after falling last week. ? ?On my exam  patient has moderate tenderness to the right thoracic musculature.  Symmetric rise and fall the chest.  Lungs are clear to auscultation bilaterally.  No midline spine pain.  Neurovascularly intact in all 4 extremities and appears to be moving all 4 extremities with ease.  No gross deficits.  No overlying zoster-like rash.  No red flags. ?  ?Patient does note that she has been getting relief with Robaxin but states that her pain is continuing to return after the Robaxin wears off.  Discussed discontinuing this medication and trying Flexeril but she declined.  Feel that this is reasonable given she has only taken 2-3 doses of Robaxin.  Also recommended that she continue to take Tylenol for pain.  We discussed dosing.  Will discharge on a course of Lidoderm patches as well.  She was given a patch in the emergency department.  Discussed light stretching as well as warm baths/showers.  PCP follow-up. ? ?Patient appears stable for discharge at this time and she is  agreeable.  We discussed return precautions.  Her questions were answered and she was amicable at the time of discharge. ?Final Clinical Impression(s) / ED Diagnoses ?Final diagnoses:  ?Acute right-sided thorac

## 2021-06-01 ENCOUNTER — Ambulatory Visit: Payer: 59 | Admitting: Critical Care Medicine

## 2021-07-22 ENCOUNTER — Ambulatory Visit: Payer: Self-pay | Admitting: Family Medicine

## 2021-08-06 ENCOUNTER — Ambulatory Visit: Payer: 59 | Admitting: Critical Care Medicine

## 2021-08-07 ENCOUNTER — Other Ambulatory Visit (HOSPITAL_BASED_OUTPATIENT_CLINIC_OR_DEPARTMENT_OTHER): Payer: Self-pay

## 2021-08-11 ENCOUNTER — Other Ambulatory Visit (HOSPITAL_BASED_OUTPATIENT_CLINIC_OR_DEPARTMENT_OTHER): Payer: Self-pay

## 2021-08-11 ENCOUNTER — Other Ambulatory Visit: Payer: Self-pay

## 2021-08-12 ENCOUNTER — Other Ambulatory Visit: Payer: Self-pay

## 2021-08-26 ENCOUNTER — Other Ambulatory Visit: Payer: Self-pay

## 2021-08-27 ENCOUNTER — Other Ambulatory Visit: Payer: Self-pay

## 2021-08-27 ENCOUNTER — Encounter: Payer: Self-pay | Admitting: Physician Assistant

## 2021-08-27 ENCOUNTER — Ambulatory Visit: Payer: Self-pay | Attending: Critical Care Medicine | Admitting: Physician Assistant

## 2021-08-27 VITALS — BP 145/88 | HR 84 | Wt 214.6 lb

## 2021-08-27 DIAGNOSIS — R52 Pain, unspecified: Secondary | ICD-10-CM

## 2021-08-27 DIAGNOSIS — E785 Hyperlipidemia, unspecified: Secondary | ICD-10-CM

## 2021-08-27 DIAGNOSIS — I10 Essential (primary) hypertension: Secondary | ICD-10-CM

## 2021-08-27 DIAGNOSIS — E1165 Type 2 diabetes mellitus with hyperglycemia: Secondary | ICD-10-CM

## 2021-08-27 LAB — POCT GLYCOSYLATED HEMOGLOBIN (HGB A1C): HbA1c, POC (controlled diabetic range): 6.6 % (ref 0.0–7.0)

## 2021-08-27 LAB — GLUCOSE, POCT (MANUAL RESULT ENTRY): POC Glucose: 183 mg/dl — AB (ref 70–99)

## 2021-08-27 MED ORDER — METFORMIN HCL 500 MG PO TABS
1000.0000 mg | ORAL_TABLET | Freq: Every day | ORAL | 3 refills | Status: DC
  Filled 2021-08-27: qty 120, 60d supply, fill #0
  Filled 2021-10-19: qty 60, 30d supply, fill #0
  Filled 2021-12-17: qty 60, 30d supply, fill #1
  Filled 2022-02-09: qty 60, 30d supply, fill #2
  Filled 2022-03-27: qty 60, 30d supply, fill #3
  Filled 2022-05-27: qty 60, 30d supply, fill #4

## 2021-08-27 MED ORDER — MELOXICAM 15 MG PO TABS
15.0000 mg | ORAL_TABLET | Freq: Every day | ORAL | 2 refills | Status: DC
Start: 2021-08-27 — End: 2021-12-28
  Filled 2021-08-27: qty 30, 30d supply, fill #0
  Filled 2021-10-19: qty 30, 30d supply, fill #1
  Filled 2021-12-17: qty 30, 30d supply, fill #2

## 2021-08-27 MED ORDER — ATORVASTATIN CALCIUM 10 MG PO TABS
ORAL_TABLET | ORAL | 1 refills | Status: DC
  Filled 2021-08-27: qty 90, fill #0
  Filled 2021-09-28: qty 30, 30d supply, fill #0
  Filled 2021-11-30 – 2021-12-07 (×2): qty 30, 30d supply, fill #1
  Filled 2022-02-01: qty 30, 30d supply, fill #2
  Filled 2022-03-22: qty 30, 30d supply, fill #3
  Filled 2022-05-10: qty 30, 30d supply, fill #4

## 2021-08-27 MED ORDER — TRIAMTERENE-HCTZ 37.5-25 MG PO TABS
1.0000 | ORAL_TABLET | Freq: Every day | ORAL | 2 refills | Status: DC
  Filled 2021-08-27: qty 90, 90d supply, fill #0
  Filled 2021-09-28: qty 30, 30d supply, fill #0
  Filled 2021-11-30 – 2021-12-07 (×2): qty 30, 30d supply, fill #1
  Filled 2022-02-01: qty 30, 30d supply, fill #2
  Filled 2022-03-22: qty 30, 30d supply, fill #3
  Filled 2022-05-10: qty 30, 30d supply, fill #4

## 2021-08-28 ENCOUNTER — Other Ambulatory Visit: Payer: Self-pay | Admitting: Physician Assistant

## 2021-08-28 LAB — COMPREHENSIVE METABOLIC PANEL
ALT: 32 IU/L (ref 0–32)
AST: 27 IU/L (ref 0–40)
Albumin/Globulin Ratio: 1.6 (ref 1.2–2.2)
Albumin: 5.1 g/dL — ABNORMAL HIGH (ref 3.8–4.9)
Alkaline Phosphatase: 75 IU/L (ref 44–121)
BUN/Creatinine Ratio: 24 — ABNORMAL HIGH (ref 9–23)
BUN: 23 mg/dL (ref 6–24)
Bilirubin Total: 0.4 mg/dL (ref 0.0–1.2)
CO2: 19 mmol/L — ABNORMAL LOW (ref 20–29)
Calcium: 10.4 mg/dL — ABNORMAL HIGH (ref 8.7–10.2)
Chloride: 101 mmol/L (ref 96–106)
Creatinine, Ser: 0.97 mg/dL (ref 0.57–1.00)
Globulin, Total: 3.2 g/dL (ref 1.5–4.5)
Glucose: 154 mg/dL — ABNORMAL HIGH (ref 70–99)
Potassium: 4.6 mmol/L (ref 3.5–5.2)
Sodium: 139 mmol/L (ref 134–144)
Total Protein: 8.3 g/dL (ref 6.0–8.5)
eGFR: 67 mL/min/{1.73_m2} (ref >59)

## 2021-08-28 LAB — SEDIMENTATION RATE: Sed Rate: 41 mm/hr — ABNORMAL HIGH (ref 0–40)

## 2021-08-28 LAB — VITAMIN D 25 HYDROXY (VIT D DEFICIENCY, FRACTURES): Vit D, 25-Hydroxy: 14.5 ng/mL — ABNORMAL LOW (ref 30.0–100.0)

## 2021-08-28 MED ORDER — VITAMIN D (ERGOCALCIFEROL) 1.25 MG (50000 UNIT) PO CAPS
50000.0000 [IU] | ORAL_CAPSULE | ORAL | 0 refills | Status: DC
  Filled 2021-08-28: qty 4, 28d supply, fill #0
  Filled 2021-09-28: qty 4, 28d supply, fill #1
  Filled 2021-10-19: qty 4, 28d supply, fill #2
  Filled 2021-11-30 – 2021-12-07 (×2): qty 4, 28d supply, fill #3

## 2021-08-28 MED ORDER — VITAMIN D (ERGOCALCIFEROL) 1.25 MG (50000 UNIT) PO CAPS
50000.0000 [IU] | ORAL_CAPSULE | ORAL | 0 refills | Status: DC
  Filled 2021-08-28: qty 16, 112d supply, fill #0

## 2021-08-31 ENCOUNTER — Other Ambulatory Visit: Payer: Self-pay

## 2021-08-31 ENCOUNTER — Ambulatory Visit: Payer: Self-pay

## 2021-09-28 ENCOUNTER — Other Ambulatory Visit: Payer: Self-pay

## 2021-09-29 NOTE — Telephone Encounter (Signed)
error 

## 2021-10-19 ENCOUNTER — Other Ambulatory Visit: Payer: Self-pay

## 2021-11-30 ENCOUNTER — Other Ambulatory Visit: Payer: Self-pay

## 2021-12-07 ENCOUNTER — Other Ambulatory Visit: Payer: Self-pay

## 2021-12-08 ENCOUNTER — Other Ambulatory Visit: Payer: Self-pay

## 2021-12-17 ENCOUNTER — Other Ambulatory Visit: Payer: Self-pay

## 2021-12-18 ENCOUNTER — Other Ambulatory Visit: Payer: Self-pay

## 2021-12-28 ENCOUNTER — Encounter: Payer: Self-pay | Admitting: Internal Medicine

## 2021-12-28 ENCOUNTER — Ambulatory Visit: Payer: Self-pay | Admitting: Critical Care Medicine

## 2021-12-28 ENCOUNTER — Ambulatory Visit: Payer: Commercial Managed Care - HMO | Attending: Critical Care Medicine | Admitting: Internal Medicine

## 2021-12-28 VITALS — BP 130/80 | HR 70 | Ht 71.0 in | Wt 219.6 lb

## 2021-12-28 DIAGNOSIS — E785 Hyperlipidemia, unspecified: Secondary | ICD-10-CM

## 2021-12-28 DIAGNOSIS — E1165 Type 2 diabetes mellitus with hyperglycemia: Secondary | ICD-10-CM | POA: Diagnosis not present

## 2021-12-28 DIAGNOSIS — E669 Obesity, unspecified: Secondary | ICD-10-CM

## 2021-12-28 DIAGNOSIS — Z1231 Encounter for screening mammogram for malignant neoplasm of breast: Secondary | ICD-10-CM

## 2021-12-28 DIAGNOSIS — N3941 Urge incontinence: Secondary | ICD-10-CM | POA: Diagnosis not present

## 2021-12-28 DIAGNOSIS — E1169 Type 2 diabetes mellitus with other specified complication: Secondary | ICD-10-CM

## 2021-12-28 DIAGNOSIS — R1032 Left lower quadrant pain: Secondary | ICD-10-CM | POA: Diagnosis not present

## 2021-12-28 DIAGNOSIS — G8929 Other chronic pain: Secondary | ICD-10-CM

## 2021-12-28 DIAGNOSIS — E1159 Type 2 diabetes mellitus with other circulatory complications: Secondary | ICD-10-CM | POA: Diagnosis not present

## 2021-12-28 DIAGNOSIS — I152 Hypertension secondary to endocrine disorders: Secondary | ICD-10-CM

## 2021-12-28 DIAGNOSIS — Z2821 Immunization not carried out because of patient refusal: Secondary | ICD-10-CM

## 2021-12-28 LAB — GLUCOSE, POCT (MANUAL RESULT ENTRY): POC Glucose: 141 mg/dl — AB (ref 70–99)

## 2021-12-28 LAB — POCT GLYCOSYLATED HEMOGLOBIN (HGB A1C): HbA1c, POC (controlled diabetic range): 6.9 % (ref 0.0–7.0)

## 2021-12-28 NOTE — Progress Notes (Signed)
Patient ID: [name], female    DOB: 1960-04-05  MRN: [id number]  CC: Diabetes, Shortness of Breath, and Groin Pain   Subjective: [name] is a 61 y.o. female who presents for chronic disease management.  PCP is Dr. Joya Bryan who is currently on leave. Her concerns today include:  Patient with history of HTN, DM type II, HL, OA of hands.  DM: Results for orders placed or performed in visit on 12/28/21  POCT glucose (manual entry)  Result Value Ref Range   POC Glucose 141 (A) 70 - 99 mg/dl  POCT glycosylated hemoglobin (Hb A1C)  Result Value Ref Range   Hemoglobin A1C     HbA1c POC (<> result, manual entry)     HbA1c, POC (prediabetic range)     HbA1c, POC (controlled diabetic range) 6.9 0.0 - 7.0 %  Reports compliance with taking metformin 500 mg 2 tablets daily. No device to check BS.  Declines getting device "Feels all I do is eat and sleep." Has membership to [employer] but only went one time.  Has a school close to house that has a walking field, had planned to start walking there.  HTN: Reports compliance with Maxide and low-salt diet. Took med already this a.m No CP.  Some SOB at times when sitting and moving around.  Sleeps on 1 pillow.  No LE edema -Gets urinary frequency and urgency.  Sometimes she has incontinence of urine about 2x/wk.   Does not want Depends  HL: Reports compliance with Lipitor.  Last LDL 88 in January of this yr  Vit D def:  last vit D was 14.5. Needs RF on Vit D supplement  Sharp pains in RT groin for almost  a year. Intermittent, last about 1 hr No hernia noted.    HM:  declines flu shot and shingles vaccine.  Due for MMG. Due for eye exam Patient Active Problem List   Diagnosis Date Noted   Type 2 diabetes mellitus with hyperglycemia, without long-term current use of insulin ([place]) 03/30/2021   Hyperlipidemia associated with type 2 diabetes mellitus ([place]) 03/30/2021   Acute non-recurrent maxillary sinusitis 03/30/2021   Pain  in left elbow 02/12/2020   Bilateral hand pain 02/12/2020   Rheumatoid factor positive 12/10/2016   Chronic arthralgias of knees and hips 12/10/2016   DIVERTICULAR DISEASE 11/26/2009   Allergic rhinitis 10/16/2009   RECTAL PAIN 10/16/2009   Essential hypertension 02/19/2009   GERD 02/19/2009   FREQUENCY, URINARY 02/19/2009   MENOPAUSE, SURGICAL 03/08/2004     Current Outpatient Medications on File Prior to Visit  Medication Sig Dispense Refill   atorvastatin (LIPITOR) 10 MG tablet TAKE 1 TABLET (10 MG TOTAL) BY MOUTH DAILY TO LOWER CHOLESTEROL 90 tablet 1   metFORMIN (GLUCOPHAGE) 500 MG tablet Take 2 tablets (1,000 mg total) by mouth daily with breakfast. 120 tablet 3   triamterene-hydrochlorothiazide (MAXZIDE-25) 37.5-25 MG tablet TAKE 1 TABLET BY MOUTH DAILY. 90 tablet 2   Vitamin D, Ergocalciferol, (DRISDOL) 1.25 MG (50000 UNIT) CAPS capsule Take 1 capsule (50,000 Units total) by mouth every 7 (seven) days. 16 capsule 0   meloxicam (MOBIC) 15 MG tablet Take 1 tablet (15 mg total) by mouth daily. (Patient not taking: Reported on 12/28/2021) 30 tablet 2   No current facility-administered medications on file prior to visit.    No Known Allergies  Social History   Socioeconomic History   Marital status: Single    Spouse name: Not on file  Number of children: 2   Years of education: Not on file   Highest education level: 11th grade  Occupational History   Not on file  Tobacco Use   Smoking status: Never   Smokeless tobacco: Never  Vaping Use   Vaping Use: Never used  Substance and Sexual Activity   Alcohol use: Yes    Alcohol/week: 2.0 standard drinks of alcohol    Types: 2 Cans of beer per week    Comment: Occasionally   Drug use: Not Currently    Frequency: 2.0 times per week    Types: Marijuana   Sexual activity: Not Currently    Birth control/protection: Surgical  Other Topics Concern   Not on file  Social History Narrative   Not on file   Social  Determinants of Health   Financial Resource Strain: Not on file  Food Insecurity: Not on file  Transportation Needs: No Transportation Needs (06/21/2019)   PRAPARE - Hydrologist (Medical): No    Lack of Transportation (Non-Medical): No  Physical Activity: Not on file  Stress: Not on file  Social Connections: Not on file  Intimate Partner Violence: Not on file    Family History  Problem Relation Age of Onset   Hypertension Mother    Diabetes Mother    Cancer Father    Diabetes Brother    Colon cancer Neg Hx    Rectal cancer Neg Hx    Stomach cancer Neg Hx     Past Surgical History:  Procedure Laterality Date   ABDOMINAL HYSTERECTOMY     ANKLE FRACTURE SURGERY Left     ROS: Review of Systems Negative except as stated above  PHYSICAL EXAM: BP 130/80   Pulse 70   Ht '5\' 11"'$  (1.803 m)   Wt 219 lb 9.6 oz (99.6 kg)   SpO2 96%   BMI 30.63 kg/m   Wt Readings from Last 3 Encounters:  12/28/21 219 lb 9.6 oz (99.6 kg)  08/27/21 214 lb 9.6 oz (97.3 kg)  05/21/21 223 lb (101.2 kg)    Physical Exam  General appearance - alert, well appearing, middle age AAF and in no distress Mental status - normal mood, behavior, speech, dress, motor activity, and thought processes Neck - supple, no significant adenopathy Chest - clear to auscultation, no wheezes, rales or rhonchi, symmetric air entry Heart - normal rate, regular rhythm, normal S1, S2, no murmurs, rubs, clicks or gallops.  No JVD Abdomen - soft, nontender, nondistended, no masses or organomegaly Groin exam:  CMA [name] present:  no masses palpitated. No hernias or enlarged LN appreciated.  Good rotation of the LT hip Extremities - peripheral pulses normal, no pedal edema, no clubbing or cyanosis      Latest Ref Rng & Units 08/27/2021    3:46 PM 09/20/2020    2:44 PM 12/25/2019    8:32 AM  CMP  Glucose 70 - 99 mg/dL 154  149  124   BUN 6 - 24 mg/dL '23  10  13   '$ Creatinine 0.57 - 1.00  mg/dL 0.97  0.69  0.71   Sodium 134 - 144 mmol/L 139  136  138   Potassium 3.5 - 5.2 mmol/L 4.6  3.6  4.4   Chloride 96 - 106 mmol/L 101  100  100   CO2 20 - 29 mmol/L '19  25  22   '$ Calcium 8.7 - 10.2 mg/dL 10.4  10.1  9.9   Total Protein  6.0 - 8.5 g/dL 8.3  8.4  7.8   Total Bilirubin 0.0 - 1.2 mg/dL 0.4  0.8  0.6   Alkaline Phos 44 - 121 IU/L 75  65  70   AST 0 - 40 IU/L '27  18  19   '$ ALT 0 - 32 IU/L 32  23  17    Lipid Panel     Component Value Date/Time   CHOL 183 03/30/2021 1452   TRIG 194 (H) 03/30/2021 1452   HDL 62 03/30/2021 1452   CHOLHDL 3.0 03/30/2021 1452   CHOLHDL 2.8 03/24/2015 1548   VLDL 37 (H) 03/24/2015 1548   LDLCALC 88 03/30/2021 1452    CBC    Component Value Date/Time   WBC 7.5 09/20/2020 1444   RBC 4.36 09/20/2020 1444   HGB 13.1 09/20/2020 1444   HGB 13.2 12/25/2019 0832   HCT 38.3 09/20/2020 1444   HCT 38.7 12/25/2019 0832   PLT 242 09/20/2020 1444   PLT 238 12/25/2019 0832   MCV 87.8 09/20/2020 1444   MCV 89 12/25/2019 0832   MCH 30.0 09/20/2020 1444   MCHC 34.2 09/20/2020 1444   RDW 13.0 09/20/2020 1444   RDW 14.3 12/25/2019 0832   LYMPHSABS 1.4 09/20/2020 1444   MONOABS 0.7 09/20/2020 1444   EOSABS 0.1 09/20/2020 1444   BASOSABS 0.0 09/20/2020 1444    ASSESSMENT AND PLAN: 1. Type 2 diabetes mellitus with obesity (H[facility]) At goal Continue Metformin 1 gram daily - POCT glucose (manual entry) - POCT glycosylated hemoglobin (Hb A1C) - Ambulatory referral to Ophthalmology  2. Hypertension associated with diabetes ([place]) Repeat BP at goal Given her urinary symptoms, I recommend cutting Maxzide dose in half then adding another BP lower med instead. Pt declines. Prefers to stay on Maxzide despite having issues of incontinence.  3. Hyperlipidemia associated with type 2 diabetes mellitus (HCC) Continue atorvastatin.  4. Groin pain, chronic, left Exam is unrevealing.  No masses or enlarged lymph nodes appreciated.  She has good rotation,  flexion and extension of the hip suggesting it is not MSK in nature.  We will continue to observe for now.  5. Urge incontinence of urine See #2 above.  6. Influenza vaccination declined Recommended.  Patient declined.  7. Encounter for screening mammogram for malignant neoplasm of breast - MM Digital Screening; Future     Patient was given the opportunity to ask questions.  Patient verbalized understanding of the plan and was able to repeat key elements of the plan.   This documentation was completed using Radio producer.  Any transcriptional errors are unintentional.  Orders Placed This Encounter  Procedures   MM Digital Screening   Ambulatory referral to Ophthalmology   POCT glucose (manual entry)   POCT glycosylated hemoglobin (Hb A1C)     Requested Prescriptions    No prescriptions requested or ordered in this encounter    Return in about 4 months (around 04/30/2022) for give her f/u appt with her PCP Dr. Joya Bryan.  Karle Plumber, MD, FACP

## 2021-12-28 NOTE — Patient Instructions (Signed)

## 2022-01-18 ENCOUNTER — Ambulatory Visit
Admission: RE | Admit: 2022-01-18 | Discharge: 2022-01-18 | Disposition: A | Discharge status: Home / Self Care | Payer: Commercial Managed Care - HMO | Source: Ambulatory Visit | Attending: Internal Medicine | Admitting: Internal Medicine

## 2022-01-18 DIAGNOSIS — Z1231 Encounter for screening mammogram for malignant neoplasm of breast: Secondary | ICD-10-CM

## 2022-02-01 ENCOUNTER — Other Ambulatory Visit: Payer: Self-pay

## 2022-02-04 ENCOUNTER — Other Ambulatory Visit: Payer: Self-pay

## 2022-02-09 ENCOUNTER — Other Ambulatory Visit: Payer: Self-pay

## 2022-02-10 ENCOUNTER — Other Ambulatory Visit: Payer: Self-pay

## 2022-02-15 ENCOUNTER — Other Ambulatory Visit: Payer: Self-pay

## 2022-03-04 ENCOUNTER — Ambulatory Visit: Payer: Commercial Managed Care - HMO | Attending: Critical Care Medicine

## 2022-03-17 ENCOUNTER — Telehealth: Payer: Self-pay | Admitting: Emergency Medicine

## 2022-03-17 NOTE — Telephone Encounter (Signed)
Copied from [place] [phone]. Topic: General - Inquiry >> Mar 17, 2022  9:27 AM Marcellus Scott wrote: Reason for CRM: Pt stated she came in to [facility] and had a diabetic eye exam done and would like her results.  Please advise.

## 2022-03-18 ENCOUNTER — Telehealth: Payer: Self-pay | Admitting: Critical Care Medicine

## 2022-03-18 NOTE — Telephone Encounter (Signed)
Copied from [place] [phone]. Topic: General - Call Back - No Documentation >> Mar 18, 2022 12:31 PM Everette C wrote: Reason for CRM: Please contact further if/when available

## 2022-03-19 NOTE — Telephone Encounter (Signed)
Called patient DOB was confirmed and she was given the result from her eye exam( Doctor reviewed and signed off on it)

## 2022-03-22 ENCOUNTER — Encounter: Payer: Self-pay | Admitting: Internal Medicine

## 2022-03-22 ENCOUNTER — Ambulatory Visit: Payer: Self-pay | Attending: Internal Medicine | Admitting: Internal Medicine

## 2022-03-22 ENCOUNTER — Other Ambulatory Visit: Payer: Self-pay

## 2022-03-22 VITALS — BP 158/99 | HR 75 | Wt 218.2 lb

## 2022-03-22 DIAGNOSIS — M7918 Myalgia, other site: Secondary | ICD-10-CM

## 2022-03-22 DIAGNOSIS — E1169 Type 2 diabetes mellitus with other specified complication: Secondary | ICD-10-CM

## 2022-03-22 DIAGNOSIS — E1159 Type 2 diabetes mellitus with other circulatory complications: Secondary | ICD-10-CM

## 2022-03-22 DIAGNOSIS — E1165 Type 2 diabetes mellitus with hyperglycemia: Secondary | ICD-10-CM

## 2022-03-22 DIAGNOSIS — I152 Hypertension secondary to endocrine disorders: Secondary | ICD-10-CM

## 2022-03-22 DIAGNOSIS — H5711 Ocular pain, right eye: Secondary | ICD-10-CM

## 2022-03-22 DIAGNOSIS — E669 Obesity, unspecified: Secondary | ICD-10-CM

## 2022-03-22 LAB — GLUCOSE, POCT (MANUAL RESULT ENTRY): POC Glucose: 164 mg/dl — AB (ref 70–99)

## 2022-03-22 MED ORDER — DICLOFENAC SODIUM 1 % EX GEL
2.0000 g | Freq: Four times a day (QID) | CUTANEOUS | 1 refills | Status: DC
  Filled 2022-03-22: qty 100, fill #0

## 2022-03-22 NOTE — Progress Notes (Signed)
Patient ID: [name], female    DOB: Jul 20, 1960  MRN: [id number]  CC: Back Pain and Eye Pain (Patient stated that her right eye only hurts when moving or blinking )   Subjective: [name] is a 62 y.o. female who presents for UC visit Her concerns today include:  Patient with history of HTN, DM type II, HL, OA of hands.   Pt c/o intermittent pain in RT eye when she blinks.  Redness sometimes.  Better today, worse last wk for several days.  Problems seeing things close up and further away.  Also c/o intermittent pain in RT paraspinal thoracic muscle area x 1 yr or more.  No initiating factors; endorses that it may flare with heavy lifting but most of the times it just comes on.  Worse with twisting of the trunk movements.  Works at [employer] where she does a little of ever thing.   Pain does not radiate.  Takes Advil at bedtime, does not help.  Sometimes [employer].   No blood in urine  BP noted to be elevated today.  Out of Triam/HCTZ x 3 days.  Plans to pick up today.  Patient Active Problem List   Diagnosis Date Noted   Type 2 diabetes mellitus with hyperglycemia, without long-term current use of insulin ([place]) 03/30/2021   Hyperlipidemia associated with type 2 diabetes mellitus ([place]) 03/30/2021   Acute non-recurrent maxillary sinusitis 03/30/2021   Pain in left elbow 02/12/2020   Bilateral hand pain 02/12/2020   Rheumatoid factor positive 12/10/2016   Chronic arthralgias of knees and hips 12/10/2016   DIVERTICULAR DISEASE 11/26/2009   Allergic rhinitis 10/16/2009   RECTAL PAIN 10/16/2009   Essential hypertension 02/19/2009   GERD 02/19/2009   FREQUENCY, URINARY 02/19/2009   MENOPAUSE, SURGICAL 03/08/2004     Current Outpatient Medications on File Prior to Visit  Medication Sig Dispense Refill   atorvastatin (LIPITOR) 10 MG tablet TAKE 1 TABLET (10 MG TOTAL) BY MOUTH DAILY TO LOWER CHOLESTEROL 90 tablet 1   metFORMIN (GLUCOPHAGE) 500 MG tablet Take 2  tablets (1,000 mg total) by mouth daily with breakfast. 120 tablet 3   triamterene-hydrochlorothiazide (MAXZIDE-25) 37.5-25 MG tablet TAKE 1 TABLET BY MOUTH DAILY. 90 tablet 2   Vitamin D, Ergocalciferol, (DRISDOL) 1.25 MG (50000 UNIT) CAPS capsule Take 1 capsule (50,000 Units total) by mouth every 7 (seven) days. 16 capsule 0   No current facility-administered medications on file prior to visit.    No Known Allergies  Social History   Socioeconomic History   Marital status: Single    Spouse name: Not on file   Number of children: 2   Years of education: Not on file   Highest education level: 11th grade  Occupational History   Not on file  Tobacco Use   Smoking status: Never   Smokeless tobacco: Never  Vaping Use   Vaping Use: Never used  Substance and Sexual Activity   Alcohol use: Yes    Alcohol/week: 2.0 standard drinks of alcohol    Types: 2 Cans of beer per week    Comment: Occasionally   Drug use: Not Currently    Frequency: 2.0 times per week    Types: Marijuana   Sexual activity: Not Currently    Birth control/protection: Surgical  Other Topics Concern   Not on file  Social History Narrative   Not on file   Social Determinants of Health   Financial Resource Strain: Not on file  Food  Insecurity: Not on file  Transportation Needs: No Transportation Needs (06/21/2019)   PRAPARE - Hydrologist (Medical): No    Lack of Transportation (Non-Medical): No  Physical Activity: Not on file  Stress: Not on file  Social Connections: Not on file  Intimate Partner Violence: Not on file    Family History  Problem Relation Age of Onset   Hypertension Mother    Diabetes Mother    Cancer Father    Diabetes Brother    Colon cancer Neg Hx    Rectal cancer Neg Hx    Stomach cancer Neg Hx     Past Surgical History:  Procedure Laterality Date   ABDOMINAL HYSTERECTOMY     ANKLE FRACTURE SURGERY Left     ROS: Review of Systems Negative  except as stated above  PHYSICAL EXAM: BP (!) 158/99   Pulse 75   Wt 218 lb 3.2 oz (99 kg)   SpO2 97%   BMI 30.43 kg/m   Physical Exam  General appearance - alert, well appearing, middle-age obese African-American female and in no distress Mental status - normal mood, behavior, speech, dress, motor activity, and thought processes Eyes: No conjunctival injection.  Extraocular movements intact. Chest - clear to auscultation, no wheezes, rales or rhonchi, symmetric air entry Heart - normal rate, regular rhythm, normal S1, S2, no murmurs, rubs, clicks or gallops Musculoskeletal -mild point tenderness in the mid thoracic paraspinal muscle right side below the bra line.      Latest Ref Rng & Units 08/27/2021    3:46 PM 09/20/2020    2:44 PM 12/25/2019    8:32 AM  CMP  Glucose 70 - 99 mg/dL 154  149  124   BUN 6 - 24 mg/dL '23  10  13   '$ Creatinine 0.57 - 1.00 mg/dL 0.97  0.69  0.71   Sodium 134 - 144 mmol/L 139  136  138   Potassium 3.5 - 5.2 mmol/L 4.6  3.6  4.4   Chloride 96 - 106 mmol/L 101  100  100   CO2 20 - 29 mmol/L '19  25  22   '$ Calcium 8.7 - 10.2 mg/dL 10.4  10.1  9.9   Total Protein 6.0 - 8.5 g/dL 8.3  8.4  7.8   Total Bilirubin 0.0 - 1.2 mg/dL 0.4  0.8  0.6   Alkaline Phos 44 - 121 IU/L 75  65  70   AST 0 - 40 IU/L '27  18  19   '$ ALT 0 - 32 IU/L 32  23  17    Lipid Panel     Component Value Date/Time   CHOL 183 03/30/2021 1452   TRIG 194 (H) 03/30/2021 1452   HDL 62 03/30/2021 1452   CHOLHDL 3.0 03/30/2021 1452   CHOLHDL 2.8 03/24/2015 1548   VLDL 37 (H) 03/24/2015 1548   LDLCALC 88 03/30/2021 1452    CBC    Component Value Date/Time   WBC 7.5 09/20/2020 1444   RBC 4.36 09/20/2020 1444   HGB 13.1 09/20/2020 1444   HGB 13.2 12/25/2019 0832   HCT 38.3 09/20/2020 1444   HCT 38.7 12/25/2019 0832   PLT 242 09/20/2020 1444   PLT 238 12/25/2019 0832   MCV 87.8 09/20/2020 1444   MCV 89 12/25/2019 0832   MCH 30.0 09/20/2020 1444   MCHC 34.2 09/20/2020 1444    RDW 13.0 09/20/2020 1444   RDW 14.3 12/25/2019 0832   LYMPHSABS 1.4 09/20/2020  1444   MONOABS 0.7 09/20/2020 1444   EOSABS 0.1 09/20/2020 1444   BASOSABS 0.0 09/20/2020 1444   Results for orders placed or performed in visit on 03/22/22  POCT glucose (manual entry)  Result Value Ref Range   POC Glucose 164 (A) 70 - 99 mg/dl    ASSESSMENT AND PLAN: 1. Eye pain, right Encourage patient to get a formal eye exam done with an optometrist or ophthalmologist.  Gave her the names of places where it is cost effective for eye exam.  2. Myofascial pain on right side Discussed putting her on a muscle relaxant to use as needed.  However patient states she does not like taking pills.  She would prefer pain rub.  Gave prescription for Voltaren gel.  I told her that she can also try IcyHot over-the-counter.  May benefit from some point injections to the area by sports medicine. - diclofenac Sodium (VOLTAREN) 1 % GEL; Apply 2 g topically 4 (four) times daily.  Dispense: 100 g; Refill: 1 - Ambulatory referral to Sports Medicine  3. Hypertension associated with diabetes ([place]) Patient to pick up a refill on triamterene HCTZ today.  4. Type 2 diabetes mellitus with hyperglycemia, without long-term current use of insulin ([place]) This was not discussed today. - POCT glucose (manual entry)   Patient was given the opportunity to ask questions.  Patient verbalized understanding of the plan and was able to repeat key elements of the plan.   This documentation was completed using Radio producer.  Any transcriptional errors are unintentional.  Orders Placed This Encounter  Procedures   Ambulatory referral to Sports Medicine   POCT glucose (manual entry)     Requested Prescriptions   Signed Prescriptions Disp Refills   diclofenac Sodium (VOLTAREN) 1 % GEL 100 g 1    Sig: Apply 2 g topically 4 (four) times daily.    No follow-ups on file.  Karle Plumber, MD, FACP

## 2022-03-29 ENCOUNTER — Other Ambulatory Visit: Payer: Self-pay

## 2022-04-02 ENCOUNTER — Other Ambulatory Visit: Payer: Self-pay

## 2022-04-12 ENCOUNTER — Ambulatory Visit: Payer: Self-pay | Admitting: Family Medicine

## 2022-05-02 NOTE — Progress Notes (Deleted)
Patient ID: [name], female    DOB: 1960/12/25  MRN: [id number]  CC: No chief complaint on file.  05/04/22 Subjective: 12/2021 seen by Wynetta Emery  Dr Joya Gaskins has not seed since 03/2021 Mikaiya Bryan is a 62 y.o. female who presents for chronic disease management.  PCP is Dr. Joya Gaskins who is currently on leave. Her concerns today include:  Patient with history of HTN, DM type II, HL, OA of hands.  DM: Results for orders placed or performed in visit on 03/22/22  POCT glucose (manual entry)  Result Value Ref Range   POC Glucose 164 (A) 70 - 99 mg/dl  Reports compliance with taking metformin 500 mg 2 tablets daily. No device to check BS.  Declines getting device "Feels all I do is eat and sleep." Has membership to [employer] but only went one time.  Has a school close to house that has a walking field, had planned to start walking there.  HTN: Reports compliance with Maxide and low-salt diet. Took med already this a.m No CP.  Some SOB at times when sitting and moving around.  Sleeps on 1 pillow.  No LE edema -Gets urinary frequency and urgency.  Sometimes she has incontinence of urine about 2x/wk.   Does not want Depends  HL: Reports compliance with Lipitor.  Last LDL 88 in January of this yr  Vit D def:  last vit D was 14.5. Needs RF on Vit D supplement  Sharp pains in RT groin for almost  a year. Intermittent, last about 1 hr No hernia noted.    HM:  declines flu shot and shingles vaccine.  Due for MMG. Due for eye exam  Saw [facility] again 03/22/22   Expand All Collapse All      Patient ID: [name], female    DOB: Apr 24, 1960  MRN: [id number]   CC: Back Pain and Eye Pain (Patient stated that her right eye only hurts when moving or blinking )     Subjective: [name] is a 62 y.o. female who presents for UC visit Her concerns today include:  Patient with history of HTN, DM type II, HL, OA of hands.    Pt c/o intermittent pain in RT eye when she blinks.   Redness sometimes.  Better today, worse last wk for several days.  Problems seeing things close up and further away.   Also c/o intermittent pain in RT paraspinal thoracic muscle area x 1 yr or more.  No initiating factors; endorses that it may flare with heavy lifting but most of the times it just comes on.  Worse with twisting of the trunk movements.  Works at [employer] where she does a little of ever thing.   Pain does not radiate.  Takes Advil at bedtime, does not help.  Sometimes [employer].   No blood in urine   BP noted to be elevated today.  Out of Triam/HCTZ x 3 days.  Plans to pick up today.      ASSESSMENT AND PLAN: 1. Eye pain, right Encourage patient to get a formal eye exam done with an optometrist or ophthalmologist.  Gave her the names of places where it is cost effective for eye exam.   2. Myofascial pain on right side Discussed putting her on a muscle relaxant to use as needed.  However patient states she does not like taking pills.  She would prefer pain rub.  Gave prescription for Voltaren gel.  I told her  that she can also try IcyHot over-the-counter.  May benefit from some point injections to the area by sports medicine. - diclofenac Sodium (VOLTAREN) 1 % GEL; Apply 2 g topically 4 (four) times daily.  Dispense: 100 g; Refill: 1 - Ambulatory referral to Sports Medicine   3. Hypertension associated with diabetes ([place]) Patient to pick up a refill on triamterene HCTZ today.   4. Type 2 diabetes mellitus with hyperglycemia, without long-term current use of insulin ([place]) This was not discussed today. - POCT glucose (manual entry)      Patient Active Problem List   Diagnosis Date Noted   Type 2 diabetes mellitus with hyperglycemia, without long-term current use of insulin ([place]) 03/30/2021   Hyperlipidemia associated with type 2 diabetes mellitus ([place]) 03/30/2021   Acute non-recurrent maxillary sinusitis 03/30/2021   Pain in left elbow 02/12/2020   Bilateral  hand pain 02/12/2020   Rheumatoid factor positive 12/10/2016   Chronic arthralgias of knees and hips 12/10/2016   DIVERTICULAR DISEASE 11/26/2009   Allergic rhinitis 10/16/2009   RECTAL PAIN 10/16/2009   Essential hypertension 02/19/2009   GERD 02/19/2009   FREQUENCY, URINARY 02/19/2009   MENOPAUSE, SURGICAL 03/08/2004     Current Outpatient Medications on File Prior to Visit  Medication Sig Dispense Refill   atorvastatin (LIPITOR) 10 MG tablet TAKE 1 TABLET (10 MG TOTAL) BY MOUTH DAILY TO LOWER CHOLESTEROL 90 tablet 1   diclofenac Sodium (VOLTAREN) 1 % GEL Apply 2 g topically 4 (four) times daily. 100 g 1   metFORMIN (GLUCOPHAGE) 500 MG tablet Take 2 tablets (1,000 mg total) by mouth daily with breakfast. 120 tablet 3   triamterene-hydrochlorothiazide (MAXZIDE-25) 37.5-25 MG tablet TAKE 1 TABLET BY MOUTH DAILY. 90 tablet 2   Vitamin D, Ergocalciferol, (DRISDOL) 1.25 MG (50000 UNIT) CAPS capsule Take 1 capsule (50,000 Units total) by mouth every 7 (seven) days. 16 capsule 0   No current facility-administered medications on file prior to visit.    No Known Allergies  Social History   Socioeconomic History   Marital status: Single    Spouse name: Not on file   Number of children: 2   Years of education: Not on file   Highest education level: 11th grade  Occupational History   Not on file  Tobacco Use   Smoking status: Never   Smokeless tobacco: Never  Vaping Use   Vaping Use: Never used  Substance and Sexual Activity   Alcohol use: Yes    Alcohol/week: 2.0 standard drinks of alcohol    Types: 2 Cans of beer per week    Comment: Occasionally   Drug use: Not Currently    Frequency: 2.0 times per week    Types: Marijuana   Sexual activity: Not Currently    Birth control/protection: Surgical  Other Topics Concern   Not on file  Social History Narrative   Not on file   Social Determinants of Health   Financial Resource Strain: Not on file  Food Insecurity: Not on  file  Transportation Needs: No Transportation Needs (06/21/2019)   PRAPARE - Hydrologist (Medical): No    Lack of Transportation (Non-Medical): No  Physical Activity: Not on file  Stress: Not on file  Social Connections: Not on file  Intimate Partner Violence: Not on file    Family History  Problem Relation Age of Onset   Hypertension Mother    Diabetes Mother    Cancer Father    Diabetes Brother  Colon cancer Neg Hx    Rectal cancer Neg Hx    Stomach cancer Neg Hx     Past Surgical History:  Procedure Laterality Date   ABDOMINAL HYSTERECTOMY     ANKLE FRACTURE SURGERY Left     ROS: Review of Systems Negative except as stated above  PHYSICAL EXAM: There were no vitals taken for this visit.  Wt Readings from Last 3 Encounters:  03/22/22 218 lb 3.2 oz (99 kg)  12/28/21 219 lb 9.6 oz (99.6 kg)  08/27/21 214 lb 9.6 oz (97.3 kg)    Physical Exam  General appearance - alert, well appearing, middle age AAF and in no distress Mental status - normal mood, behavior, speech, dress, motor activity, and thought processes Neck - supple, no significant adenopathy Chest - clear to auscultation, no wheezes, rales or rhonchi, symmetric air entry Heart - normal rate, regular rhythm, normal S1, S2, no murmurs, rubs, clicks or gallops.  No JVD Abdomen - soft, nontender, nondistended, no masses or organomegaly Groin exam:  CMA Carly Mack present:  no masses palpitated. No hernias or enlarged LN appreciated.  Good rotation of the LT hip Extremities - peripheral pulses normal, no pedal edema, no clubbing or cyanosis      Latest Ref Rng & Units 08/27/2021    3:46 PM 09/20/2020    2:44 PM 12/25/2019    8:32 AM  CMP  Glucose 70 - 99 mg/dL 154  149  124   BUN 6 - 24 mg/dL '23  10  13   '$ Creatinine 0.57 - 1.00 mg/dL 0.97  0.69  0.71   Sodium 134 - 144 mmol/L 139  136  138   Potassium 3.5 - 5.2 mmol/L 4.6  3.6  4.4   Chloride 96 - 106 mmol/L 101  100  100    CO2 20 - 29 mmol/L '19  25  22   '$ Calcium 8.7 - 10.2 mg/dL 10.4  10.1  9.9   Total Protein 6.0 - 8.5 g/dL 8.3  8.4  7.8   Total Bilirubin 0.0 - 1.2 mg/dL 0.4  0.8  0.6   Alkaline Phos 44 - 121 IU/L 75  65  70   AST 0 - 40 IU/L '27  18  19   '$ ALT 0 - 32 IU/L 32  23  17    Lipid Panel     Component Value Date/Time   CHOL 183 03/30/2021 1452   TRIG 194 (H) 03/30/2021 1452   HDL 62 03/30/2021 1452   CHOLHDL 3.0 03/30/2021 1452   CHOLHDL 2.8 03/24/2015 1548   VLDL 37 (H) 03/24/2015 1548   LDLCALC 88 03/30/2021 1452    CBC    Component Value Date/Time   WBC 7.5 09/20/2020 1444   RBC 4.36 09/20/2020 1444   HGB 13.1 09/20/2020 1444   HGB 13.2 12/25/2019 0832   HCT 38.3 09/20/2020 1444   HCT 38.7 12/25/2019 0832   PLT 242 09/20/2020 1444   PLT 238 12/25/2019 0832   MCV 87.8 09/20/2020 1444   MCV 89 12/25/2019 0832   MCH 30.0 09/20/2020 1444   MCHC 34.2 09/20/2020 1444   RDW 13.0 09/20/2020 1444   RDW 14.3 12/25/2019 0832   LYMPHSABS 1.4 09/20/2020 1444   MONOABS 0.7 09/20/2020 1444   EOSABS 0.1 09/20/2020 1444   BASOSABS 0.0 09/20/2020 1444    ASSESSMENT AND PLAN: 1. Type 2 diabetes mellitus with obesity (HCC) At goal Continue Metformin 1 gram daily - POCT glucose (manual entry) -  POCT glycosylated hemoglobin (Hb A1C) - Ambulatory referral to Ophthalmology  2. Hypertension associated with diabetes ([place]) Repeat BP at goal Given her urinary symptoms, I recommend cutting Maxzide dose in half then adding another BP lower med instead. Pt declines. Prefers to stay on Maxzide despite having issues of incontinence.  3. Hyperlipidemia associated with type 2 diabetes mellitus (HCC) Continue atorvastatin.  4. Groin pain, chronic, left Exam is unrevealing.  No masses or enlarged lymph nodes appreciated.  She has good rotation, flexion and extension of the hip suggesting it is not MSK in nature.  We will continue to observe for now.  5. Urge incontinence of urine See #2  above.  6. Influenza vaccination declined Recommended.  Patient declined.  7. Encounter for screening mammogram for malignant neoplasm of breast - MM Digital Screening; Future     Patient was given the opportunity to ask questions.  Patient verbalized understanding of the plan and was able to repeat key elements of the plan.   This documentation was completed using Radio producer.  Any transcriptional errors are unintentional.  No orders of the defined types were placed in this encounter.    Requested Prescriptions    No prescriptions requested or ordered in this encounter    No follow-ups on file.  [name], MD, [place]

## 2022-05-03 DIAGNOSIS — M19079 Primary osteoarthritis, unspecified ankle and foot: Secondary | ICD-10-CM | POA: Insufficient documentation

## 2022-05-04 ENCOUNTER — Ambulatory Visit: Payer: Medicaid Other | Attending: Critical Care Medicine | Admitting: Critical Care Medicine

## 2022-05-10 ENCOUNTER — Other Ambulatory Visit: Payer: Self-pay

## 2022-05-27 ENCOUNTER — Other Ambulatory Visit: Payer: Self-pay

## 2022-05-31 ENCOUNTER — Other Ambulatory Visit: Payer: Self-pay

## 2022-05-31 NOTE — Progress Notes (Signed)
Patient seen by Wallene Huh, PharmD Candidate on 05/31/2022 while they were picking up prescriptions at [facility].   Blood pressure today was : 144/89, HR 72; on recheck: 142/95, HR 81   Patient does not have an automated home blood pressure machine.   Medication review was performed. They are taking medications as prescribed.   The following barriers to adherence were noted:  - They do not have cost concerns.  - They do not have transportation concerns.  - They do not need assistance obtaining refills.  - They do not  occasionally forget to take some of their prescribed medications.  - They do not feel like one/some of their medications make them feel poorly.  - They do not have questions or concerns about their medications.  - They do not have follow up scheduled with their primary care provider/cardiologist.   The following interventions were completed:  - Medications were reviewed  - Patient was educated on goal blood pressures and long term health implications of elevated blood pressure  - Patient was educated on how to access home blood pressure machine  - Patient was counseled on lifestyle modifications to improve blood pressure, including diet and exercise. Patient reports not doing much exercise. Counseled how exercise, even just walking every day, can improve BP.   The patient does not have follow up scheduled:   Candiss Norse  PharmD Candidate Class of 2024  [place], [place].D. PGY-2 Ambulatory Care Pharmacy Resident

## 2022-06-24 ENCOUNTER — Ambulatory Visit (INDEPENDENT_AMBULATORY_CARE_PROVIDER_SITE_OTHER): Payer: 59

## 2022-06-24 ENCOUNTER — Ambulatory Visit (HOSPITAL_COMMUNITY)
Admission: RE | Admit: 2022-06-24 | Discharge: 2022-06-24 | Disposition: A | Discharge status: Home / Self Care | Payer: 59 | Source: Ambulatory Visit | Attending: Nurse Practitioner | Admitting: Nurse Practitioner

## 2022-06-24 ENCOUNTER — Encounter (HOSPITAL_COMMUNITY): Payer: Self-pay

## 2022-06-24 VITALS — BP 148/78 | HR 79 | Temp 98.0°F | Resp 18

## 2022-06-24 DIAGNOSIS — R109 Unspecified abdominal pain: Secondary | ICD-10-CM

## 2022-06-24 DIAGNOSIS — R1011 Right upper quadrant pain: Secondary | ICD-10-CM | POA: Insufficient documentation

## 2022-06-24 LAB — CBC WITH DIFFERENTIAL/PLATELET
Abs Immature Granulocytes: 0.02 10*3/uL (ref 0.00–0.07)
Basophils Absolute: 0 10*3/uL (ref 0.0–0.1)
Basophils Relative: 0 %
Eosinophils Absolute: 0.1 10*3/uL (ref 0.0–0.5)
Eosinophils Relative: 2 %
HCT: 36.8 % (ref 36.0–46.0)
Hemoglobin: 12.9 g/dL (ref 12.0–15.0)
Immature Granulocytes: 0 %
Lymphocytes Relative: 27 %
Lymphs Abs: 1.3 10*3/uL (ref 0.7–4.0)
MCH: 30.7 pg (ref 26.0–34.0)
MCHC: 35.1 g/dL (ref 30.0–36.0)
MCV: 87.6 fL (ref 80.0–100.0)
Monocytes Absolute: 0.3 10*3/uL (ref 0.1–1.0)
Monocytes Relative: 7 %
Neutro Abs: 3.1 10*3/uL (ref 1.7–7.7)
Neutrophils Relative %: 64 %
Platelets: 253 10*3/uL (ref 150–400)
RBC: 4.2 MIL/uL (ref 3.87–5.11)
RDW: 13.7 % (ref 11.5–15.5)
WBC: 4.9 10*3/uL (ref 4.0–10.5)
nRBC: 0 % (ref 0.0–0.2)

## 2022-06-24 MED ORDER — ALUM & MAG HYDROXIDE-SIMETH 200-200-20 MG/5ML PO SUSP
30.0000 mL | Freq: Once | ORAL | Status: AC
  Administered 2022-06-24: 30 mL via ORAL

## 2022-06-24 MED ORDER — ALUM & MAG HYDROXIDE-SIMETH 200-200-20 MG/5ML PO SUSP
ORAL | Status: AC
  Filled 2022-06-24: qty 30

## 2022-06-24 NOTE — ED Triage Notes (Signed)
Pt states she has some abdominal pain that comes and goes when she eats but not always when she eats. She states its been a couple of weeks. She does complain of diarrhea sometimes. She states she took something yesterday that made her have diarrhea it was OTC but she doesn't know the name.

## 2022-06-24 NOTE — ED Provider Notes (Signed)
[facility]    CSN: [id number] Arrival date & time: 06/24/22  1324      History   Chief Complaint Chief Complaint  Patient presents with   Abdominal Pain    HPI [name] is a 62 y.o. female.   Patient presents today for generalized abdominal pain ongoing for the past 2 weeks.  Reports currently, the abdominal pain is a 4/10 and she describes the pain as a "bloating".  Currently, the pain is generalized in the middle of her stomach and around towards the sides as well.  No localized pain in the abdomen.  The pain comes on suddenly and does not radiate down either leg or around to her back.  The patient denies fever, nausea or vomiting, decreased appetite, new rash, dysuria, urinary frequency, hematuria, indigestion or frequent burping, and frequent NSAID use.  No new medications.  Reports she did have 1 episode of vomiting send symptoms started, however not chronically.  Reports she has had occasional diarrhea since the pain began as well.  Reports yesterday, she took milk of magnesia which caused diarrhea, but did not help much with the pain.  No blood in the stool or vomit.  Reports occasionally, pain is worse after eating.  She does not know if certain foods seem to exacerbate symptoms.  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  No recent changes in chronic medication.  Reports good compliance with chronic medication.    Past Medical History:  Diagnosis Date   Bilateral carpal tunnel syndrome    Hypercholesteremia    Hypertension    Hypertension    OAB (overactive bladder)    Rheumatoid arthritis     Patient Active Problem List   Diagnosis Date Noted   Osteoarthritis of ankle and foot 05/03/2022   Type 2 diabetes mellitus with hyperglycemia, without long-term current use of insulin 03/30/2021   Hyperlipidemia associated with type 2 diabetes mellitus 03/30/2021    Acute non-recurrent maxillary sinusitis 03/30/2021   Pain in left elbow 02/12/2020   Bilateral hand pain 02/12/2020   Rheumatoid factor positive 12/10/2016   Chronic arthralgias of knees and hips 12/10/2016   DIVERTICULAR DISEASE 11/26/2009   Allergic rhinitis 10/16/2009   RECTAL PAIN 10/16/2009   Essential hypertension 02/19/2009   GERD 02/19/2009   FREQUENCY, URINARY 02/19/2009   MENOPAUSE, SURGICAL 03/08/2004    Past Surgical History:  Procedure Laterality Date   ABDOMINAL HYSTERECTOMY     ANKLE FRACTURE SURGERY Left     OB History   No obstetric history on file.      Home Medications    Prior to Admission medications   Medication Sig Start Date End Date Taking? Authorizing Provider  atorvastatin (LIPITOR) 10 MG tablet TAKE 1 TABLET (10 MG TOTAL) BY MOUTH DAILY TO LOWER CHOLESTEROL 08/27/21 08/27/22 Yes McClung, Marzella Schlein, PA-C  diclofenac Sodium (VOLTAREN) 1 % GEL Apply 2 g topically 4 (four) times daily. 03/22/22  Yes Marcine Matar, MD  metFORMIN (GLUCOPHAGE) 500 MG tablet Take 2 tablets (1,000 mg total) by mouth daily with breakfast. 08/27/21 08/27/22 Yes McClung, Marzella Schlein, PA-C  triamterene-hydrochlorothiazide (MAXZIDE-25) 37.5-25 MG tablet TAKE 1 TABLET BY MOUTH DAILY. 08/27/21 08/27/22 Yes McClung, Marzella Schlein, PA-C  Vitamin D, Ergocalciferol, (DRISDOL) 1.25 MG (50000 UNIT) CAPS capsule Take 1 capsule (50,000 Units total) by mouth every 7 (seven) days. 08/28/21  Yes Anders Simmonds, PA-C  Family History Family History  Problem Relation Age of Onset   Hypertension Mother    Diabetes Mother    Cancer Father    Diabetes Brother    Colon cancer Neg Hx    Rectal cancer Neg Hx    Stomach cancer Neg Hx     Social History Social History   Tobacco Use   Smoking status: Never   Smokeless tobacco: Never  Vaping Use   Vaping Use: Never used  Substance Use Topics   Alcohol use: Yes    Alcohol/week: 3.0 standard drinks of alcohol    Types: 3 Cans of beer per  week   Drug use: Yes    Frequency: 2.0 times per week    Types: Marijuana     Allergies   Patient has no known allergies.   Review of Systems Review of Systems Per HPI  Physical Exam Triage Vital Signs ED Triage Vitals  Enc Vitals Group     BP 06/24/22 1340 (!) 148/78     Pulse Rate 06/24/22 1340 79     Resp 06/24/22 1340 18     Temp 06/24/22 1340 98 F (36.7 C)     Temp Source 06/24/22 1340 Oral     SpO2 06/24/22 1340 97 %     Weight --      Height --      Head Circumference --      Peak Flow --      Pain Score 06/24/22 1337 6     Pain Loc --      Pain Edu? --      Excl. in GC? --    No data found.  Updated Vital Signs BP (!) 148/78 (BP Location: Left Arm)   Pulse 79   Temp 98 F (36.7 C) (Oral)   Resp 18   SpO2 97%   Visual Acuity Right Eye Distance:   Left Eye Distance:   Bilateral Distance:    Right Eye Near:   Left Eye Near:    Bilateral Near:     Physical Exam Vitals and nursing note reviewed.  Constitutional:      General: She is not in acute distress.    Appearance: Normal appearance. She is not toxic-appearing.  HENT:     Head: Normocephalic and atraumatic.     Mouth/Throat:     Mouth: Mucous membranes are moist.     Pharynx: Oropharynx is clear.  Eyes:     General: No scleral icterus.    Extraocular Movements: Extraocular movements intact.  Cardiovascular:     Rate and Rhythm: Normal rate and regular rhythm.  Pulmonary:     Effort: Pulmonary effort is normal. No respiratory distress.     Breath sounds: Normal breath sounds. No wheezing, rhonchi or rales.  Abdominal:     General: Abdomen is flat. Bowel sounds are normal. There is no distension.     Palpations: Abdomen is soft. There is no hepatomegaly or mass.     Tenderness: There is abdominal tenderness in the right upper quadrant. There is no right CVA tenderness, left CVA tenderness, guarding or rebound. Negative signs include Murphy's sign and McBurney's sign.  Musculoskeletal:      Cervical back: Normal range of motion.  Lymphadenopathy:     Cervical: No cervical adenopathy.  Skin:    General: Skin is warm and dry.     Capillary Refill: Capillary refill takes less than 2 seconds.     Coloration: Skin is not jaundiced or  pale.     Findings: No erythema.  Neurological:     Mental Status: She is alert and oriented to person, place, and time.  Psychiatric:        Behavior: Behavior is cooperative.      UC Treatments / Results  Labs (all labs ordered are listed, but only abnormal results are displayed) Labs Reviewed  CBC WITH DIFFERENTIAL/PLATELET  COMPREHENSIVE METABOLIC PANEL  LIPASE, BLOOD    EKG   Radiology DG Abd 1 View  Result Date: 06/24/2022 CLINICAL DATA:  Intermittent abdominal pain. EXAM: ABDOMEN - 1 VIEW COMPARISON:  CT abdomen/pelvis 09/20/2020 FINDINGS: There is a paucity of bowel gas in the abdomen without evidence of obstruction. There is no evidence of free intraperitoneal air. There is no gross organomegaly or abnormal soft tissue calcification The lung bases are clear.  There is no acute osseous abnormality. IMPRESSION: Unremarkable abdominal radiographs. Electronically Signed   By: Lesia Hausen M.D.   On: 06/24/2022 14:47    Procedures Procedures (including critical care time)  Medications Ordered in UC Medications  alum & mag hydroxide-simeth (MAALOX/MYLANTA) 200-200-20 MG/5ML suspension 30 mL (30 mLs Oral Given 06/24/22 1420)    Initial Impression / Assessment and Plan / UC Course  I have reviewed the triage vital signs and the nursing notes.  Pertinent labs & imaging results that were available during my care of the patient were reviewed by me and considered in my medical decision making (see chart for details).   Patient is well-appearing, normotensive, afebrile, not tachycardic, not tachypneic, oxygenating well on room air.    1. RUQ abdominal pain On examination, there is tenderness in the right upper abdomen; McBurney's  negative Abdominal x-ray today unremarkable GI cocktail given with improvement in pain CBC obtained to check for infectious cause, lipase for pancreatitis, and CMET for liver function Suspect some component of GERD; patient does not take anything currently for this Recommended continued use of Maalox/Mylanta as needed for pain, dietary changes-handout given, and recommend close follow-up with PCP within the next week Strict ER precautions discussed with patient  The patient was given the opportunity to ask questions.  All questions answered to their satisfaction.  The patient is in agreement to this plan.   Final Clinical Impressions(s) / UC Diagnoses   Final diagnoses:  RUQ abdominal pain     Discharge Instructions      The abdominal x-ray today does not show any blockage or acute findings  We have given you a GI cocktail today of Maalox/Mylanta which did seem to help with your symptoms, you can pick this up over-the-counter and take it per the directions on the box since it did help with your pain  We have checked blood work and will call you if anything is abnormal tomorrow     ED Prescriptions   None    PDMP not reviewed this encounter.   Valentino Nose, NP 06/24/22 [phone]

## 2022-06-24 NOTE — Discharge Instructions (Signed)
The abdominal x-ray today does not show any blockage or acute findings  We have given you a GI cocktail today of Maalox/Mylanta which did seem to help with your symptoms, you can pick this up over-the-counter and take it per the directions on the box since it did help with your pain  We have checked blood work and will call you if anything is abnormal tomorrow

## 2022-07-04 NOTE — Progress Notes (Unsigned)
CC: Establishment of care visit/ED follow up  HPI:  [name] is a 62 y.o. female with a past medical history of HTN, HLD, RA, Vitamin D Deficiency and GERD and presenting to the clinic today for establishment of care. Please see problem based assessment and plan for additional details.  Past Medical History:  Diagnosis Date   Acute non-recurrent maxillary sinusitis 03/30/2021   Bilateral carpal tunnel syndrome    Bilateral hand pain 02/12/2020   DIVERTICULAR DISEASE 11/26/2009   Annotation: noted in barium enema  Qualifier: Diagnosis of   By: Daphine Deutscher FNP, Zena Amos      Replacing diagnoses that were inactivated after the 06/07/22 regulatory import   FREQUENCY, URINARY 02/19/2009   Qualifier: Diagnosis of   By: Daphine Deutscher FNP, [employer]       Hypercholesteremia    Hypertension    Hypertension    MENOPAUSE, SURGICAL 03/08/2004   Qualifier: Diagnosis of   By: Daphine Deutscher FNP, [employer]       OAB (overactive bladder)    Rheumatoid arthritis (HCC)      Current Outpatient Medications (Endocrine & Metabolic):    metFORMIN (GLUCOPHAGE) 500 MG tablet, Take 2 tablets (1,000 mg total) by mouth daily with breakfast.  Current Outpatient Medications (Cardiovascular):    rosuvastatin (CRESTOR) 20 MG tablet, Take 1 tablet (20 mg total) by mouth daily.   triamterene-hydrochlorothiazide (MAXZIDE-25) 37.5-25 MG tablet, Take 1 tablet by mouth daily.     Current Outpatient Medications (Other):    ciclopirox (PENLAC) 8 % solution, Apply topically at bedtime. Apply over nail and surrounding skin. Apply daily over previous coat. After seven (7) days, may remove with alcohol and continue cycle. Repeat until nail clears.   diclofenac Sodium (VOLTAREN) 1 % GEL, Apply 2 g topically 4 (four) times daily.   pantoprazole (PROTONIX) 40 MG tablet, Take 1 tablet (40 mg total) by mouth daily.   Vitamin D, Ergocalciferol, (DRISDOL) 1.25 MG (50000 UNIT) CAPS capsule, Take 1 capsule (50,000 Units total) by mouth every  7 (seven) days.  Allergies: NKDA  Family History  Problem Relation Age of Onset   Hypertension Mother    Diabetes Mother    Cancer Father    Diabetes Brother    Colon cancer Neg Hx    Rectal cancer Neg Hx    Stomach cancer Neg Hx    Father: Bone cancer Mother: Dementia, HTN, DMII Brother: HTN, DMII  Social History: Lives by herself. No pets. Support is friends and family. Not smoking. Drinks beer everyday (2 beers). Drinks liquor 2 shots daily. Drinking about one year. Works at [employer]. Can do all ADLs and iADLs. Does need help opening jars.   Review of Systems: ROS negative except for what is noted on the assessment and plan.  Vitals:   07/05/22 0902  BP: (!) 158/82  Pulse: 71  Temp: 98.6 F (37 C)  TempSrc: Oral  SpO2: 99%  Weight: 213 lb 12.8 oz (97 kg)  Height: 5\' 11"  (1.803 m)     Physical Exam: General: Well appearing, NAD HENT: normocephalic, atraumatic EYES: conjunctiva non-erythematous, no scleral icterus CV: regular rate, normal rhythm, no murmurs, rubs, gallops. Pulmonary: normal work of breathing on RA, lungs clear to auscultation, no rales, wheezes, rhonchi Abdominal: non-distended, soft, non-tender to palpation, normal BS, mass on left abdomen that is soft and mobile.  Skin: Warm and dry, no rashes or lesions Neurological: MS: awake, alert and oriented x3, normal speech and fund of knowledge Motor: moves all extremities antigravity Psych:  normal affect    Assessment & Plan:   Essential hypertension Pt with hx of HTN. She is on triamterene-HCTZ 37.5-25 mg. Last took on Saturday. Renal fxn normal in 08/2021. On this med for 2 years. Given she has been out of the medication, will restart medication and follow up in one month with home readings to see if pt's blood pressure is controlled.   GERD Pt has Hx of GERD that has been a problem for over 5 years. She was previously on prilosec 20 mg but has not taken this medication in a long time.  Given abdominal pain that worsens with eating, GERD can be a likely cause as well. Will trial with PPI and see if she notices an improvement.   Hyperlipidemia associated with type 2 diabetes mellitus (HCC) On lipitor 10 mg qd. Chol 183 LDL 88 in 03/2021. ASCVD risk 14 %. Repeat lipid panel is Cholesterol 188, LDL 89 with ASCVD risk at 16.1 %. Will change lipitor to Crestor 20 mg qd.   Type 2 diabetes mellitus with hyperglycemia, without long-term current use of insulin (HCC) On metformin 1000 mg qd. Last A1c  7.3 in 03/2021. Numbness and tingling in the feet intermittently. A1c unchanged at 7.3 today. Will continue metformin and advise to gradually up-titrate to 1000 mg BID. Also discussed lifestyle modifications.   Healthcare maintenance Colonoscopy 2022 repeat in 7 years, so need repeat in 2029.  Mammogram negative in 01/2022 Pap smear negative in 04/2021 with co-testing.  A1c this visit ACR this visit Foot exam this visit.   RUQ abdominal pain Pt with right upper abdominal pain that has been present for 2 months. She went to the ED and it improved with GI cocktail. CBC normal. States it is more periumibilical. Describes it as dull. It worsens with eating and improves with alcohol cessation. Ddx include GERD vs biliary colic vs pancreatitis vs acute hepatitis. Lipase ordered and negative. LFTs wnl. Will trial with protonix to treat GERD c/b gastritis as underlying eitiology. Will get abdominal ultrasound to look for gallstones. On exam, no TTP and normal bowel sounds. Left upper quadrant with mass and exam suspicious for lipoma. 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 Will get ultrasound of the abdomen including LUQ.   Abdominal mass, left upper quadrant On exam, no TTP and normal bowel sounds. Left upper quadrant with mass and exam suspicious for lipoma. Previous CT reviewed from 09/2020  showed a cyst from the posteromedial margin of the spleen, 2.5 cm, which was enlarged compared to the prior CT. Will get ultrasound of the abdomen including LUQ.    See Encounters Tab for problem based charting.  Patient discussed with Dr. Lauralee Evener, MD Eligha Bridegroom. [facility] Internal Medicine Residency, PGY-2

## 2022-07-05 ENCOUNTER — Ambulatory Visit: Payer: 59

## 2022-07-05 ENCOUNTER — Telehealth: Payer: Self-pay

## 2022-07-05 ENCOUNTER — Other Ambulatory Visit (HOSPITAL_COMMUNITY): Payer: Self-pay

## 2022-07-05 ENCOUNTER — Encounter: Payer: Self-pay | Admitting: Internal Medicine

## 2022-07-05 ENCOUNTER — Ambulatory Visit (INDEPENDENT_AMBULATORY_CARE_PROVIDER_SITE_OTHER): Payer: 59 | Admitting: Internal Medicine

## 2022-07-05 ENCOUNTER — Other Ambulatory Visit: Payer: Self-pay

## 2022-07-05 VITALS — BP 158/82 | HR 71 | Temp 98.6°F | Ht 71.0 in | Wt 213.8 lb

## 2022-07-05 DIAGNOSIS — B351 Tinea unguium: Secondary | ICD-10-CM

## 2022-07-05 DIAGNOSIS — E1169 Type 2 diabetes mellitus with other specified complication: Secondary | ICD-10-CM | POA: Diagnosis not present

## 2022-07-05 DIAGNOSIS — R1084 Generalized abdominal pain: Secondary | ICD-10-CM

## 2022-07-05 DIAGNOSIS — E1165 Type 2 diabetes mellitus with hyperglycemia: Secondary | ICD-10-CM

## 2022-07-05 DIAGNOSIS — E785 Hyperlipidemia, unspecified: Secondary | ICD-10-CM | POA: Diagnosis not present

## 2022-07-05 DIAGNOSIS — R1011 Right upper quadrant pain: Secondary | ICD-10-CM

## 2022-07-05 DIAGNOSIS — R1902 Left upper quadrant abdominal swelling, mass and lump: Secondary | ICD-10-CM

## 2022-07-05 DIAGNOSIS — E538 Deficiency of other specified B group vitamins: Secondary | ICD-10-CM

## 2022-07-05 DIAGNOSIS — Z0000 Encounter for general adult medical examination without abnormal findings: Secondary | ICD-10-CM

## 2022-07-05 DIAGNOSIS — E559 Vitamin D deficiency, unspecified: Secondary | ICD-10-CM

## 2022-07-05 DIAGNOSIS — K219 Gastro-esophageal reflux disease without esophagitis: Secondary | ICD-10-CM

## 2022-07-05 DIAGNOSIS — I10 Essential (primary) hypertension: Secondary | ICD-10-CM | POA: Diagnosis not present

## 2022-07-05 DIAGNOSIS — Z7984 Long term (current) use of oral hypoglycemic drugs: Secondary | ICD-10-CM

## 2022-07-05 MED ORDER — CICLOPIROX 8 % EX SOLN
Freq: Every day | CUTANEOUS | 0 refills | Status: DC
Start: 2022-07-05 — End: 2023-05-23
  Filled 2022-07-05: qty 6.6, 30d supply, fill #0

## 2022-07-05 MED ORDER — PANTOPRAZOLE SODIUM 40 MG PO TBEC
40.0000 mg | DELAYED_RELEASE_TABLET | Freq: Two times a day (BID) | ORAL | 1 refills | Status: DC
Start: 2022-07-05 — End: 2022-07-06
  Filled 2022-07-05: qty 60, 30d supply, fill #0

## 2022-07-05 MED ORDER — METFORMIN HCL 500 MG PO TABS
1000.0000 mg | ORAL_TABLET | Freq: Every day | ORAL | 5 refills | Status: AC
Start: 2022-07-05 — End: 2023-08-28
  Filled 2022-07-05: qty 120, 60d supply, fill #0
  Filled 2022-09-30 – 2022-10-01 (×2): qty 120, 60d supply, fill #1
  Filled 2022-12-23: qty 120, 60d supply, fill #2
  Filled 2023-04-11: qty 120, 60d supply, fill #3
  Filled 2023-06-28: qty 120, 60d supply, fill #4

## 2022-07-05 MED ORDER — ROSUVASTATIN CALCIUM 20 MG PO TABS
20.0000 mg | ORAL_TABLET | Freq: Every day | ORAL | 3 refills | Status: DC
Start: 2022-07-05 — End: 2023-05-23
  Filled 2022-07-05: qty 30, 30d supply, fill #0
  Filled 2022-08-24: qty 30, 30d supply, fill #1
  Filled 2022-09-30 – 2022-10-01 (×2): qty 30, 30d supply, fill #2
  Filled 2022-11-26: qty 30, 30d supply, fill #3
  Filled 2023-01-13: qty 30, 30d supply, fill #4
  Filled 2023-03-22 (×2): qty 30, 30d supply, fill #5
  Filled 2023-05-23: qty 30, 30d supply, fill #6

## 2022-07-05 MED ORDER — TRIAMTERENE-HCTZ 37.5-25 MG PO TABS
1.0000 | ORAL_TABLET | Freq: Every day | ORAL | 3 refills | Status: DC
Start: 2022-07-05 — End: 2023-07-10
  Filled 2022-07-05: qty 90, 90d supply, fill #0
  Filled 2022-10-25: qty 90, 90d supply, fill #1
  Filled 2023-04-11: qty 90, 90d supply, fill #2

## 2022-07-05 NOTE — Patient Instructions (Addendum)
[name], it was a pleasure seeing you today! You endorsed feeling well today. Below are some of the things we talked about this visit. We look forward to seeing you in the follow up appointment!  Today we discussed: I refilled your medications. I am doing lab work.  I will place eye doctor and foot doctor referral for you.  I would like to see you in 4 weeks.  Please use the solution one the nail nightly and clean with alcohol on on the 7th day.   I have ordered the following labs today:  Lab Orders         Hemoglobin A1c         Vitamin D (25 hydroxy)         Vitamin B12         CMP14 + Anion Gap         Lipase         Lipid Profile         Microalbumin / Creatinine Urine Ratio       Referrals ordered today:   Referral Orders         Ambulatory referral to Podiatry         Ambulatory referral to Ophthalmology       I have ordered the following medication/changed the following medications:   Stop the following medications: Medications Discontinued During This Encounter  Medication Reason   atorvastatin (LIPITOR) 10 MG tablet Discontinued by provider   triamterene-hydrochlorothiazide (MAXZIDE-25) 37.5-25 MG tablet Reorder   metFORMIN (GLUCOPHAGE) 500 MG tablet Reorder     Start the following medications: Meds ordered this encounter  Medications   triamterene-hydrochlorothiazide (MAXZIDE-25) 37.5-25 MG tablet    Sig: Take 1 tablet by mouth daily.    Dispense:  90 tablet    Refill:  3   rosuvastatin (CRESTOR) 20 MG tablet    Sig: Take 1 tablet (20 mg total) by mouth daily.    Dispense:  90 tablet    Refill:  3   metFORMIN (GLUCOPHAGE) 500 MG tablet    Sig: Take 2 tablets (1,000 mg total) by mouth daily with breakfast.    Dispense:  120 tablet    Refill:  5   pantoprazole (PROTONIX) 40 MG tablet    Sig: Take 1 tablet (40 mg total) by mouth 2 (two) times daily.    Dispense:  60 tablet    Refill:  1   ciclopirox (PENLAC) 8 % solution    Sig: Apply  topically at bedtime. Apply over nail and surrounding skin. Apply daily over previous coat. After seven (7) days, may remove with alcohol and continue cycle.Repeat until nail clears.    Dispense:  6.6 mL    Refill:  0     Follow-up: 1 month follow up  Please make sure to arrive 15 minutes prior to your next appointment. If you arrive late, you may be asked to reschedule.   We look forward to seeing you next time. Please call our clinic at [phone] if you have any questions or concerns. The best time to call is Monday-Friday from 9am-4pm, but there is someone available 24/7. If after hours or the weekend, call the main hospital number and ask for the Internal Medicine Resident On-Call. If you need medication refills, please notify your pharmacy one week in advance and they will send [place] a request.  Thank you for letting us take part in your care. Wishing you the best!  Thank you, [employer]  Welton Flakes, MD

## 2022-07-05 NOTE — Telephone Encounter (Signed)
Prior Authorization for patient (Pantoprazole) came through on cover my meds was submitted with last office notes awaiting approval or denial. 

## 2022-07-06 ENCOUNTER — Other Ambulatory Visit (HOSPITAL_COMMUNITY): Payer: Self-pay

## 2022-07-06 LAB — CMP14 + ANION GAP
AST: 21 IU/L (ref 0–40)
Albumin/Globulin Ratio: 1.7 (ref 1.2–2.2)
BUN/Creatinine Ratio: 15 (ref 12–28)
Calcium: 10 mg/dL (ref 8.7–10.3)
Potassium: 4.3 mmol/L (ref 3.5–5.2)
Sodium: 141 mmol/L (ref 134–144)

## 2022-07-06 LAB — LIPID PANEL
Cholesterol, Total: 188 mg/dL (ref 100–199)
LDL Chol Calc (NIH): 89 mg/dL (ref 0–99)

## 2022-07-06 LAB — HEMOGLOBIN A1C: Est. average glucose Bld gHb Est-mCnc: 163 mg/dL

## 2022-07-06 MED ORDER — PANTOPRAZOLE SODIUM 40 MG PO TBEC
40.0000 mg | DELAYED_RELEASE_TABLET | Freq: Every day | ORAL | 1 refills | Status: DC
Start: 2022-07-06 — End: 2022-09-14
  Filled 2022-07-06: qty 30, 30d supply, fill #0

## 2022-07-06 NOTE — Telephone Encounter (Addendum)
Decision:Denied Bernadette Hoit (Key: [id number]) PA Case ID #: [id number] Need Help? Call us at [phone] Outcome Denied on April 29 Your PA request has been denied. Additional information will be provided in the denial communication. (Message 1140) Drug Pantoprazole Sodium 40MG  dr tablets ePA cloud logo Form [employer] Electronic PA Form [phone] NCPDP)  More information has been placed in doctors box.

## 2022-07-07 ENCOUNTER — Telehealth: Payer: Self-pay

## 2022-07-07 LAB — CMP14 + ANION GAP
ALT: 25 IU/L (ref 0–32)
Albumin: 4.8 g/dL (ref 3.8–4.9)
Alkaline Phosphatase: 77 IU/L (ref 44–121)
Anion Gap: 17 mmol/L (ref 10.0–18.0)
BUN: 10 mg/dL (ref 8–27)
Bilirubin Total: 0.3 mg/dL (ref 0.0–1.2)
CO2: 20 mmol/L (ref 20–29)
Chloride: 104 mmol/L (ref 96–106)
Creatinine, Ser: 0.65 mg/dL (ref 0.57–1.00)
Globulin, Total: 2.9 g/dL (ref 1.5–4.5)
Glucose: 127 mg/dL — ABNORMAL HIGH (ref 70–99)
Total Protein: 7.7 g/dL (ref 6.0–8.5)
eGFR: 101 mL/min/{1.73_m2} (ref >59)

## 2022-07-07 LAB — LIPID PANEL
Chol/HDL Ratio: 2.3 ratio (ref 0.0–4.4)
HDL: 82 mg/dL (ref >39)
Triglycerides: 97 mg/dL (ref 0–149)
VLDL Cholesterol Cal: 17 mg/dL (ref 5–40)

## 2022-07-07 LAB — LIPASE: Lipase: 16 U/L (ref 14–72)

## 2022-07-07 LAB — VITAMIN B12: Vitamin B-12: 1498 pg/mL — ABNORMAL HIGH (ref 232–1245)

## 2022-07-07 LAB — MICROALBUMIN / CREATININE URINE RATIO
Creatinine, Urine: 125.1 mg/dL
Microalb/Creat Ratio: 6 mg/g creat (ref 0–29)
Microalbumin, Urine: 7 ug/mL

## 2022-07-07 LAB — VITAMIN D 25 HYDROXY (VIT D DEFICIENCY, FRACTURES): Vit D, 25-Hydroxy: 19.7 ng/mL — ABNORMAL LOW (ref 30.0–100.0)

## 2022-07-07 LAB — HEMOGLOBIN A1C: Hgb A1c MFr Bld: 7.3 % — ABNORMAL HIGH (ref 4.8–5.6)

## 2022-07-07 NOTE — Telephone Encounter (Signed)
Called patient. Discussed results. Most labs wnl. Her Vit D is low. Recommended she take 800 units OTC Vit D. She voices understanding. A1c 7.3. this is unchanged compared to A1c 1 year ago. No regimen change at this time. Can discuss GLP1 at future visit.  Urine ACR wnl.

## 2022-07-07 NOTE — Telephone Encounter (Signed)
Pt is requesting a call back about her lab results .Marland Kitchen Stated she can't get  her results on mychart

## 2022-07-08 DIAGNOSIS — R1011 Right upper quadrant pain: Secondary | ICD-10-CM | POA: Insufficient documentation

## 2022-07-08 DIAGNOSIS — Z0000 Encounter for general adult medical examination without abnormal findings: Secondary | ICD-10-CM | POA: Insufficient documentation

## 2022-07-08 DIAGNOSIS — R1902 Left upper quadrant abdominal swelling, mass and lump: Secondary | ICD-10-CM | POA: Insufficient documentation

## 2022-07-08 NOTE — Assessment & Plan Note (Signed)
Pt with right upper abdominal pain that has been present for 2 months. She went to the ED and it improved with GI cocktail. CBC normal. States it is more periumibilical. Describes it as dull. It worsens with eating and improves with alcohol cessation. Ddx include GERD vs biliary colic vs pancreatitis vs acute hepatitis. Lipase ordered and negative. LFTs wnl. Will trial with protonix to treat GERD c/b gastritis as underlying eitiology. Will get abdominal ultrasound to look for gallstones. On exam, no TTP and normal bowel sounds. Left upper quadrant with mass and exam suspicious for lipoma. 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 Will get ultrasound of the abdomen including LUQ.

## 2022-07-08 NOTE — Assessment & Plan Note (Signed)
On exam, no TTP and normal bowel sounds. Left upper quadrant with mass and exam suspicious for lipoma. 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 Will get ultrasound of the abdomen including LUQ.

## 2022-07-08 NOTE — Assessment & Plan Note (Signed)
Pt with hx of HTN. She is on triamterene-HCTZ 37.5-25 mg. Last took on Saturday. Renal fxn normal in 08/2021. On this med for 2 years. Given she has been out of the medication, will restart medication and follow up in one month with home readings to see if pt's blood pressure is controlled.

## 2022-07-08 NOTE — Assessment & Plan Note (Signed)
On lipitor 10 mg qd. Chol 183 LDL 88 in 03/2021. ASCVD risk 14 %. Repeat lipid panel is Cholesterol 188, LDL 89 with ASCVD risk at 13.0 %. Will change lipitor to Crestor 20 mg qd.

## 2022-07-08 NOTE — Assessment & Plan Note (Signed)
Colonoscopy 2022 repeat in 7 years, so need repeat in 2029.  Mammogram negative in 01/2022 Pap smear negative in 04/2021 with co-testing.  A1c this visit ACR this visit Foot exam this visit.

## 2022-07-08 NOTE — Assessment & Plan Note (Signed)
Pt has Hx of GERD that has been a problem for over 5 years. She was previously on prilosec 20 mg but has not taken this medication in a long time. Given abdominal pain that worsens with eating, GERD can be a likely cause as well. Will trial with PPI and see if she notices an improvement.

## 2022-07-08 NOTE — Assessment & Plan Note (Signed)
On metformin 1000 mg qd. Last A1c  7.3 in 03/2021. Numbness and tingling in the feet intermittently. A1c unchanged at 7.3 today. Will continue metformin and advise to gradually up-titrate to 1000 mg BID. Also discussed lifestyle modifications.

## 2022-07-16 NOTE — Progress Notes (Signed)
Internal Medicine Clinic Attending  Case discussed with Dr. Khan  At the time of the visit.  We reviewed the resident's history and exam and pertinent patient test results.  I agree with the assessment, diagnosis, and plan of care documented in the resident's note.  

## 2022-07-19 ENCOUNTER — Ambulatory Visit (INDEPENDENT_AMBULATORY_CARE_PROVIDER_SITE_OTHER): Payer: 59 | Admitting: Podiatry

## 2022-07-19 DIAGNOSIS — M79675 Pain in left toe(s): Secondary | ICD-10-CM

## 2022-07-19 DIAGNOSIS — M79674 Pain in right toe(s): Secondary | ICD-10-CM

## 2022-07-19 DIAGNOSIS — B351 Tinea unguium: Secondary | ICD-10-CM

## 2022-07-19 NOTE — Progress Notes (Signed)
       Subjective:  Patient ID: [name], female    DOB: April 03, 1960,  MRN: [id number]   [name] presents to clinic today for:  Chief Complaint  Patient presents with   Nail Problem    Prediabetic. Nail trim. PCP prescribed medication for nail fungus.    . Patient notes nails are thick, discolored, elongated and painful in shoegear when trying to ambulate.  Patient was recently prescribed ciclopirox lacquer to put on the nails daily and then remove once weekly.  She just began this last week.  She states that she is getting into a routine where she is applying it at bedtime.  Denies numbness to the feet.  PCP is Gwenevere Abbot, MD.  No Known Allergies  Review of Systems: Negative except as noted in the HPI.  Objective:  There were no vitals filed for this visit.  [name] is a pleasant 62 y.o. female in NAD. AAO x 3.  Vascular Examination: Capillary refill time is 3-5 seconds to toes bilateral. Palpable pedal pulses b/l LE. Digital hair present b/l. No pedal edema b/l. Skin temperature gradient WNL b/l. No varicosities b/l. No cyanosis or clubbing noted b/l.   Dermatological Examination: Pedal skin with normal turgor, texture and tone b/l. No open wounds. No interdigital macerations b/l. Toenails x10 are 3mm thick, discolored, dystrophic with subungual debris. There is pain with compression of the nail plates.  They are elongated x10.  There is a hyperkeratotic lesion on the plantar medial aspect the hallux IPJ bilateral.  Neurological Examination: Protective sensation intact bilateral LE. Vibratory sensation intact bilateral LE.  Musculoskeletal Examination: Muscle strength 5/5 to all LE muscle groups b/l.      Latest Ref Rng & Units 07/05/2022   10:44 AM 12/28/2021   11:22 AM 08/27/2021    3:37 PM  Hemoglobin A1C  Hemoglobin-A1c 4.8 - 5.6 % 7.3  6.9  6.6     Assessment/Plan: 1. Pain due to onychomycosis of toenails of both feet     Discussed the  onychomycosis today.  She will continue with the [place] nail lacquer daily.  Discussed good daily diabetic foot habits to get into and continue moisturizing the skin daily.  The pinch calluses were shaved with a sterile #[id number] blade today.  The mycotic toenails were sharply debrided x10 with sterile nail nippers and a power debriding burr to decrease bulk/thickness and length.    Return in about 3 months (around 10/19/2022) for [facility] and fungal nail recheck ([place]).   Clerance Lav, DPM, FACFAS [facility]     [address] [place], [place] [address]                Office [phone]  Fax [phone]

## 2022-08-24 ENCOUNTER — Other Ambulatory Visit (HOSPITAL_COMMUNITY): Payer: Self-pay

## 2022-08-30 ENCOUNTER — Ambulatory Visit (HOSPITAL_COMMUNITY)
Admission: RE | Admit: 2022-08-30 | Discharge: 2022-08-30 | Disposition: A | Discharge status: Home / Self Care | Payer: 59 | Source: Ambulatory Visit | Attending: Internal Medicine | Admitting: Internal Medicine

## 2022-08-30 DIAGNOSIS — R1902 Left upper quadrant abdominal swelling, mass and lump: Secondary | ICD-10-CM | POA: Diagnosis present

## 2022-08-30 DIAGNOSIS — R1011 Right upper quadrant pain: Secondary | ICD-10-CM | POA: Insufficient documentation

## 2022-08-31 ENCOUNTER — Ambulatory Visit (HOSPITAL_COMMUNITY): Payer: 59

## 2022-09-01 ENCOUNTER — Telehealth: Payer: Self-pay

## 2022-09-01 NOTE — Telephone Encounter (Signed)
Pt is requesting a call back .[name] She left a vmail on doris line for the results of her ultrasound

## 2022-09-02 NOTE — Telephone Encounter (Signed)
Pt rtn call again about her ultrasound results. Pt states no one has called her back after leaving a voicemail as well.  Pt also has questions about her Vitamin medication and what she needs to take.

## 2022-09-14 ENCOUNTER — Ambulatory Visit (INDEPENDENT_AMBULATORY_CARE_PROVIDER_SITE_OTHER): Payer: 59 | Admitting: Student

## 2022-09-14 ENCOUNTER — Encounter: Payer: Self-pay | Admitting: Student

## 2022-09-14 ENCOUNTER — Other Ambulatory Visit (HOSPITAL_COMMUNITY): Payer: Self-pay

## 2022-09-14 VITALS — BP 146/88 | HR 73 | Temp 98.2°F | Ht 71.0 in | Wt 212.4 lb

## 2022-09-14 DIAGNOSIS — K219 Gastro-esophageal reflux disease without esophagitis: Secondary | ICD-10-CM

## 2022-09-14 DIAGNOSIS — I10 Essential (primary) hypertension: Secondary | ICD-10-CM | POA: Diagnosis not present

## 2022-09-14 DIAGNOSIS — E559 Vitamin D deficiency, unspecified: Secondary | ICD-10-CM

## 2022-09-14 DIAGNOSIS — R1011 Right upper quadrant pain: Secondary | ICD-10-CM | POA: Diagnosis not present

## 2022-09-14 MED ORDER — PANTOPRAZOLE SODIUM 40 MG PO TBEC
40.0000 mg | DELAYED_RELEASE_TABLET | Freq: Every day | ORAL | 2 refills | Status: DC
Start: 2022-09-14 — End: 2023-01-13
  Filled 2022-09-14: qty 30, 30d supply, fill #0
  Filled 2022-10-25: qty 30, 30d supply, fill #1
  Filled 2022-12-02: qty 30, 30d supply, fill #2

## 2022-09-14 NOTE — Progress Notes (Signed)
CC: abdominal pain f/u  HPI:  [name] is a 62 y.o. female living with a history stated below and presents today for abdominal pain f/u. Please see problem based assessment and plan for additional details.  Past Medical History:  Diagnosis Date   Acute non-recurrent maxillary sinusitis 03/30/2021   Bilateral carpal tunnel syndrome    Bilateral hand pain 02/12/2020   DIVERTICULAR DISEASE 11/26/2009   Annotation: noted in barium enema  Qualifier: Diagnosis of   By: Daphine Deutscher FNP, Nykedtra      Replacing diagnoses that were inactivated after the 06/07/22 regulatory import   FREQUENCY, URINARY 02/19/2009   Qualifier: Diagnosis of   By: Daphine Deutscher FNP, Nykedtra       Hypercholesteremia    Hypertension    Hypertension    MENOPAUSE, SURGICAL 03/08/2004   Qualifier: Diagnosis of   By: Daphine Deutscher FNP, Nykedtra       OAB (overactive bladder)    Rheumatoid arthritis (HCC)     Current Outpatient Medications on File Prior to Visit  Medication Sig Dispense Refill   ciclopirox (PENLAC) 8 % solution Apply topically at bedtime. Apply over nail and surrounding skin. Apply daily over previous coat. After seven (7) days, may remove with alcohol and continue cycle. Repeat until nail clears. 6.6 mL 0   diclofenac Sodium (VOLTAREN) 1 % GEL Apply 2 g topically 4 (four) times daily. 100 g 1   metFORMIN (GLUCOPHAGE) 500 MG tablet Take 2 tablets (1,000 mg total) by mouth daily with breakfast. 120 tablet 5   rosuvastatin (CRESTOR) 20 MG tablet Take 1 tablet (20 mg total) by mouth daily. 90 tablet 3   triamterene-hydrochlorothiazide (MAXZIDE-25) 37.5-25 MG tablet Take 1 tablet by mouth daily. 90 tablet 3   No current facility-administered medications on file prior to visit.   Review of Systems: ROS negative except for what is noted on the assessment and plan.  Vitals:   09/14/22 1439 09/14/22 1524  BP: (!) 153/86 (!) 146/88  Pulse: 80 73  Temp: 98.2 F (36.8 C)   TempSrc: Oral   SpO2: 99%   Weight:  212 lb 6.4 oz (96.3 kg)   Height: 5\' 11"  (1.803 m)    Physical Exam: Constitutional: well-appearing female sitting in chair comfortably, in no acute distress HENT: normocephalic atraumatic Cardiovascular: regular rate and rhythm Pulmonary/Chest: normal work of breathing on room air Abdominal: bowel sounds present, soft, non-distended, non-tender  Neurological: alert & oriented x 3 Skin: warm and dry Psych: pleasant mood  Assessment & Plan:   RUQ abdominal pain Presents for f/u visit from OV on 5/2. States abdominal pain is diffuse in upper quadrants and mainly provoked after eating or drinking especially after large meal. Since starting Protonix per last OV in May, pain has improved. Blood work then unrevealing. Abdominal ultrasound without acute findings for the abdominal pain. Only noted possible hepatic steatosis. Denies any hematochezia or melena. Denies nausea or vomiting. Has some constipation but BM earlier today. Does have hx of GERD. Other differentials include but not limited to IBS given reports of constipation at times but no pain relief with defecation, gastroparesis given hx of DM but no early satiety, nausea or vomiting. H pylori but currently on PPI and would need to be off of it for testing. However, patient reports PPI has helped with reducing abdominal pain. Given abdominal pain improved and less frequent, monitor and reevaluate if pain worsens or persists. Could consider further workup then if worsening pain.   Essential hypertension BP  slightly improved on repeat to 146/88 from 153/86. Did not take her triamterene-hydrochlorothiazide 37.5-25 mg today. Reports adherence most days. Encouraged patient about daily adherence to her antihypertensive which patient agrees. If not well controlled with better adherence at future visit, can consider adding third agent such as amlodipine.   Plan -Continue triamterene-hydrochlorothiazide 37.5-25 mg  -Reassess BP at next visit, if not  well controlled, consider adding third agent  GERD States her abdominal pain improved with taking PPI. Will continue Protonix 40 mg daily for GERD.   Vitamin D deficiency Last vitamin D level was 19.7 in April. Not on any supplementation at this time. States she was confused about what to get for vitamin D supplementation. Discussed purchasing OTC vitamin D supplementation from store or pharmacy, information provided on visit summary, patient verbalizes understanding.   Plan -Start OTC vitamin D3 800 units daily  -Recheck vitamin D level at future visit   Patient discussed with Dr. Marin Roberts, D.O. [facility] Internal [facility], PGY-2 Phone: [phone] Date 09/14/2022 Time 7:52 PM

## 2022-09-14 NOTE — Assessment & Plan Note (Addendum)
Presents for f/u visit from OV on 5/2. States abdominal pain is diffuse in upper quadrants and mainly provoked after eating or drinking especially after large meal. Since starting Protonix per last OV in May, pain has improved. Blood work then unrevealing. Abdominal ultrasound without acute findings for the abdominal pain. Only noted possible hepatic steatosis. Denies any hematochezia or melena. Denies nausea or vomiting. Has some constipation but BM earlier today. Does have hx of GERD. Other differentials include but not limited to IBS given reports of constipation at times but no pain relief with defecation, gastroparesis given hx of DM but no early satiety, nausea or vomiting. H pylori but currently on PPI and would need to be off of it for testing. However, patient reports PPI has helped with reducing abdominal pain. Given abdominal pain improved and less frequent, monitor and reevaluate if pain worsens or persists. Could consider further workup then if worsening pain.

## 2022-09-14 NOTE — Assessment & Plan Note (Signed)
States her abdominal pain improved with taking PPI. Will continue Protonix 40 mg daily for GERD.

## 2022-09-14 NOTE — Patient Instructions (Addendum)
Thank you, Ms.Oretha Ellis for allowing [place] to provide your care today. Today we discussed your abdominal pain.   -I am glad to hear the abdominal pain has improved and not as frequent.  -I have refilled your Protonix today.  -You can pick up at the store: Vitamin D3 supplement (800 units) daily.  -Blood pressure slightly better after rechecking it. Please make sure to take your triamterene-hydrochlorothiazide 37.5-25 mg daily.   Start the following medications: Meds ordered this encounter  Medications   pantoprazole (PROTONIX) 40 MG tablet    Sig: Take 1 tablet (40 mg total) by mouth daily.    Dispense:  30 tablet    Refill:  2   Follow up:  1-2 months for diabetes    Should you have any questions or concerns please call the internal medicine clinic at [phone].    Rana Snare, D.O. [facility]

## 2022-09-14 NOTE — Assessment & Plan Note (Addendum)
Last vitamin D level was 19.7 in April. Not on any supplementation at this time. States she was confused about what to get for vitamin D supplementation. Discussed purchasing OTC vitamin D supplementation from store or pharmacy, information provided on visit summary, patient verbalizes understanding.   Plan -Start OTC vitamin D3 800 units daily  -Recheck vitamin D level at future visit

## 2022-09-14 NOTE — Assessment & Plan Note (Addendum)
BP slightly improved on repeat to 146/88 from 153/86. Did not take her triamterene-hydrochlorothiazide 37.5-25 mg today. Reports adherence most days. Encouraged patient about daily adherence to her antihypertensive which patient agrees. If not well controlled with better adherence at future visit, can consider adding third agent such as amlodipine.   Plan -Continue triamterene-hydrochlorothiazide 37.5-25 mg  -Reassess BP at next visit, if not well controlled, consider adding third agent

## 2022-09-16 NOTE — Progress Notes (Signed)
Internal Medicine Clinic Attending  Case discussed with Dr. Zheng  At the time of the visit.  We reviewed the resident's history and exam and pertinent patient test results.  I agree with the assessment, diagnosis, and plan of care documented in the resident's note.  

## 2022-10-01 ENCOUNTER — Other Ambulatory Visit (HOSPITAL_COMMUNITY): Payer: Self-pay

## 2022-10-18 ENCOUNTER — Encounter: Payer: 59 | Admitting: Student

## 2022-10-18 ENCOUNTER — Ambulatory Visit: Payer: 59 | Admitting: Podiatry

## 2022-10-25 ENCOUNTER — Ambulatory Visit: Payer: 59 | Admitting: Podiatry

## 2022-11-26 ENCOUNTER — Other Ambulatory Visit (HOSPITAL_COMMUNITY): Payer: Self-pay

## 2022-12-06 ENCOUNTER — Other Ambulatory Visit (HOSPITAL_COMMUNITY): Payer: Self-pay

## 2022-12-29 ENCOUNTER — Other Ambulatory Visit (HOSPITAL_COMMUNITY): Payer: Self-pay

## 2023-01-05 ENCOUNTER — Encounter (HOSPITAL_COMMUNITY): Payer: Self-pay

## 2023-01-05 ENCOUNTER — Ambulatory Visit (HOSPITAL_COMMUNITY)
Admission: EM | Admit: 2023-01-05 | Discharge: 2023-01-05 | Disposition: A | Discharge status: Home / Self Care | Payer: 59 | Attending: Family Medicine | Admitting: Family Medicine

## 2023-01-05 ENCOUNTER — Ambulatory Visit (INDEPENDENT_AMBULATORY_CARE_PROVIDER_SITE_OTHER): Payer: 59

## 2023-01-05 ENCOUNTER — Other Ambulatory Visit (HOSPITAL_COMMUNITY): Payer: Self-pay

## 2023-01-05 DIAGNOSIS — M545 Low back pain, unspecified: Secondary | ICD-10-CM

## 2023-01-05 DIAGNOSIS — H539 Unspecified visual disturbance: Secondary | ICD-10-CM

## 2023-01-05 LAB — POCT FASTING CBG KUC MANUAL ENTRY: POCT Glucose (KUC): 161 mg/dL — AB (ref 70–99)

## 2023-01-05 MED ORDER — TRAMADOL HCL 50 MG PO TABS
50.0000 mg | ORAL_TABLET | Freq: Four times a day (QID) | ORAL | 0 refills | Status: DC | PRN
  Filled 2023-01-05: qty 15, 4d supply, fill #0

## 2023-01-05 MED ORDER — MELOXICAM 15 MG PO TABS
15.0000 mg | ORAL_TABLET | Freq: Every day | ORAL | 0 refills | Status: DC
  Filled 2023-01-05: qty 30, 30d supply, fill #0

## 2023-01-05 NOTE — ED Triage Notes (Signed)
Pt states she fell last week and is c/o back pain.  States she has been taking OTC medications with no relief.  Pt also states she feels like there is something wrong with her right eye.  Pt unable to verbalize exactly what it is,but denies any problems with her vision.

## 2023-01-05 NOTE — Discharge Instructions (Addendum)
Be aware, you have been prescribed pain medications that may cause drowsiness. Do not combine with alcohol or recreational drugs. Please do not drive, operate heavy machinery, or take part in activities that require making important decisions while on this medication as your judgement may be clouded.  

## 2023-01-08 NOTE — ED Provider Notes (Signed)
[facility]   [id number] 01/05/23 Arrival Time: 1005  ASSESSMENT & PLAN:  1. Acute midline low back pain without sciatica   2. Visual disturbance    I have personally viewed and independently interpreted the imaging studies ordered this visit. LUMBAR spine: no acute bony changes appreciated.   Discharge Medication List as of 01/05/2023 12:26 PM     START taking these medications   Details  meloxicam (MOBIC) 15 MG tablet Take 1 tablet (15 mg total) by mouth daily., Starting Wed 01/05/2023, Normal    traMADol (ULTRAM) 50 MG tablet Take 1 tablet (50 mg total) by mouth every 6 (six) hours as needed., Starting Wed 01/05/2023, Normal        Orders Placed This Encounter  Procedures   DG Lumbar Spine Complete   Ambulatory referral to Ophthalmology   Visual acuity screening   POC CBG monitoring    Work/school excuse note: provided. Recommend:  Follow-up Information     [facility].   Why: If your back pain is worsening or failing to improve as anticipated. Contact information: [address] [place] [address] [phone]               Orders Placed This Encounter  Procedures   Ambulatory referral to Ophthalmology    Referral Priority:   Urgent    Referral Type:   Consultation    Referral Reason:   Specialty Services Required    Referred to Provider:   Carmela Rima, MD    Requested Specialty:   Ophthalmology    Number of Visits Requested:   1      Visual Acuity  Right Eye Distance: 20/40 (pt is currently not wearing glasses or contacts.) Left Eye Distance: 20/25 (pt is currently not wearing glasses or contacts.) Bilateral Distance: 20/40 (pt is currently not wearing glasses or contacts.)  Right Eye Near:   Left Eye Near:    Bilateral Near:        [place] Controlled Substances Registry consulted for this patient. I feel the risk/benefit ratio today is favorable for proceeding with this  prescription for a controlled substance. Medication sedation precautions given.  Reviewed expectations re: course of current medical issues. Questions answered. Outlined signs and symptoms indicating need for more acute intervention. Patient verbalized understanding. After Visit Summary given.  SUBJECTIVE: History from: patient. [name] is a 62 y.o. female who reports lower back pain; s/p fall last week; continued soreness. No extremity sensation changes or weakness. Denies abd pain. Normal bowel/bladder habits. No tx PTA.  Also mentions visual disturbance of R eye; sev days; "like something keeps floating in my vision; kind of looks like a line". Transient. Denies eye trauma. No visual loss or curtain covering her eye. Does not wear contact lenses. Denies eye pain.  Past Surgical History:  Procedure Laterality Date   ABDOMINAL HYSTERECTOMY     ANKLE FRACTURE SURGERY Left       OBJECTIVE:  Vitals:   01/05/23 1039  BP: (!) 149/84  Pulse: 67  Resp: 16  Temp: 98.1 F (36.7 C)  TempSrc: Oral  SpO2: 98%    General appearance: alert; no distress HEENT: [place]; AT; PERRLA; EOMI without pain; no gross eye abnormalities on exam Neck: supple with FROM Resp: unlabored respirations Extremities: moves bilat LE normally CV: brisk extremity capillary refill of bilateral LE; 2+ DP pulse of bilateral LE. Skin: warm and dry; no visible rashes Neurologic: gait normal; normal sensation and  strength of bilateral LE Psychological: alert and cooperative; normal mood and affect  Imaging: DG Lumbar Spine Complete  Result Date: 01/05/2023 CLINICAL DATA:  Pain, status post fall EXAM: LUMBAR SPINE - COMPLETE 4+ VIEW COMPARISON:  03/15/2008 FINDINGS: There is no evidence of lumbar spine fracture. No significant listhesis. Lower lumbar facet arthropathy. Mild disc height loss at L5-S1. Intervertebral discs are otherwise maintained. IMPRESSION: 1. No acute fracture or traumatic listhesis. 2. Lower  lumbar facet arthropathy. Electronically Signed   By: Wiliam Ke M.D.   On: 01/05/2023 12:58       No Known Allergies  Past Medical History:  Diagnosis Date   Acute non-recurrent maxillary sinusitis 03/30/2021   Bilateral carpal tunnel syndrome    Bilateral hand pain 02/12/2020   DIVERTICULAR DISEASE 11/26/2009   Annotation: noted in barium enema  Qualifier: Diagnosis of   By: Daphine Deutscher FNP, Zena Amos      Replacing diagnoses that were inactivated after the 06/07/22 regulatory import   FREQUENCY, URINARY 02/19/2009   Qualifier: Diagnosis of   By: Daphine Deutscher FNP, Nykedtra       Hypercholesteremia    Hypertension    Hypertension    MENOPAUSE, SURGICAL 03/08/2004   Qualifier: Diagnosis of   By: Daphine Deutscher FNP, Nykedtra       OAB (overactive bladder)    Rheumatoid arthritis (HCC)    Social History   Socioeconomic History   Marital status: Single    Spouse name: Not on file   Number of children: 2   Years of education: Not on file   Highest education level: 11th grade  Occupational History   Not on file  Tobacco Use   Smoking status: Never   Smokeless tobacco: Never  Vaping Use   Vaping status: Never Used  Substance and Sexual Activity   Alcohol use: Yes    Alcohol/week: 3.0 standard drinks of alcohol    Types: 3 Cans of beer per week   Drug use: Yes    Frequency: 2.0 times per week    Types: Marijuana   Sexual activity: Not Currently    Birth control/protection: Surgical  Other Topics Concern   Not on file  Social History Narrative   Not on file   Social Determinants of Health   Financial Resource Strain: Not on file  Food Insecurity: Food Insecurity Present (07/05/2022)   Hunger Vital Sign    Worried About Running Out of Food in the Last Year: Sometimes true    Ran Out of Food in the Last Year: Sometimes true  Transportation Needs: No Transportation Needs (06/21/2019)   PRAPARE - Administrator, Civil Service (Medical): No    Lack of Transportation  (Non-Medical): No  Physical Activity: Not on file  Stress: Not on file  Social Connections: Socially Isolated (07/05/2022)   Social Connection and Isolation Panel [NHANES]    Frequency of Communication with Friends and Family: More than three times a week    Frequency of Social Gatherings with Friends and Family: Twice a week    Attends Religious Services: Never    Database administrator or Organizations: No    Attends Engineer, structural: Never    Marital Status: Never married   Family History  Problem Relation Age of Onset   Hypertension Mother    Diabetes Mother    Cancer Father    Diabetes Brother    Colon cancer Neg Hx    Rectal cancer Neg Hx    Stomach cancer Neg  Hx    Past Surgical History:  Procedure Laterality Date   ABDOMINAL HYSTERECTOMY     ANKLE FRACTURE SURGERY Left        Mardella Layman, MD 01/08/23 1001

## 2023-01-13 ENCOUNTER — Other Ambulatory Visit: Payer: Self-pay | Admitting: Student

## 2023-01-13 ENCOUNTER — Other Ambulatory Visit (HOSPITAL_COMMUNITY): Payer: Self-pay

## 2023-01-13 DIAGNOSIS — K219 Gastro-esophageal reflux disease without esophagitis: Secondary | ICD-10-CM

## 2023-01-14 ENCOUNTER — Other Ambulatory Visit (HOSPITAL_COMMUNITY): Payer: Self-pay

## 2023-01-14 ENCOUNTER — Other Ambulatory Visit: Payer: Self-pay

## 2023-01-14 MED ORDER — PANTOPRAZOLE SODIUM 40 MG PO TBEC
40.0000 mg | DELAYED_RELEASE_TABLET | Freq: Every day | ORAL | 2 refills | Status: DC
Start: 2023-01-14 — End: 2023-05-23
  Filled 2023-01-14: qty 30, 30d supply, fill #0
  Filled 2023-03-22: qty 30, 30d supply, fill #1

## 2023-01-20 ENCOUNTER — Other Ambulatory Visit (HOSPITAL_COMMUNITY): Payer: Self-pay

## 2023-03-22 ENCOUNTER — Ambulatory Visit: Payer: 59 | Admitting: Student

## 2023-03-22 ENCOUNTER — Other Ambulatory Visit (HOSPITAL_COMMUNITY): Payer: Self-pay

## 2023-03-22 VITALS — BP 139/86 | HR 80 | Temp 98.4°F | Ht 71.0 in | Wt 207.5 lb

## 2023-03-22 DIAGNOSIS — G5602 Carpal tunnel syndrome, left upper limb: Secondary | ICD-10-CM | POA: Diagnosis not present

## 2023-03-22 DIAGNOSIS — J309 Allergic rhinitis, unspecified: Secondary | ICD-10-CM

## 2023-03-22 DIAGNOSIS — G5600 Carpal tunnel syndrome, unspecified upper limb: Secondary | ICD-10-CM | POA: Insufficient documentation

## 2023-03-22 NOTE — Patient Instructions (Addendum)
 [name],Thank you for allowing me to take part in your care today.  Here are your instructions.  1.  Regarding your allergic rhinitis please start taking Flonase  daily.  Please also start taking either Xyzal, Claritin , or Zyrtec daily.  2.  Regarding your carpal tunnel syndrome, please obtain a wrist splint.  Asked the pharmacy when you go for a wrist splint.  3.  Please come back in 1 month and we can address your diabetes.  Thank you, Dr. Tobie  If you have any other questions please contact the internal medicine clinic at [phone] If it is after hours, please call the [facility] at [phone] and then ask the person who picks up for the resident on call.

## 2023-03-22 NOTE — Assessment & Plan Note (Signed)
 Patient presents to the clinic today with 2-week history of concerns of stuffy/runny nose, states that she feels like she is in a tunnel with her hearing, and states that when she yawns her throat hurts.  She states that this happens when the seasons started getting colder.  She does report having a history of allergic rhinitis.  She states in the past she used Nasacort  and Flonase , but is not been using recently.  She has tried Vicks which has not helped.  On exam, patient does not have any cobblestoning that I appreciate to her oropharynx.  Nares look erythematous.  I think likely given the length of time this is allergic rhinitis versus an infectious process.  She denies any fevers or chills.  Her nasal drainage is clear and not purulent.  Patient can benefit from intranasal steroids and daily antihistamine.  Plan: -Patient courage to do sinus rinses -Patient encouraged to start taking daily antihistamine -Patient encouraged to start taking intranasal steroid such as Nasacort  or Flonase 

## 2023-03-22 NOTE — Progress Notes (Signed)
 CC: Fingertips are numb and throat problems  HPI:  [name] is a 63 y.o. female with a past medical history of hypertension, GERD, type 2 diabetes, hyperlipidemia who presents for concerns of throat issues numb fingertips. Please see assessment and plan for full HPI.   Medications: Diabetes: Metformin  1000 mg daily GERD: Protonix  40 mg daily Hyperlipidemia: Rosuvastatin  20 mg daily Hypertension: Triamterene -hydrochlorothiazide  37.5-25 mg daily   Past Medical History:  Diagnosis Date   Acute non-recurrent maxillary sinusitis 03/30/2021   Bilateral carpal tunnel syndrome    Bilateral hand pain 02/12/2020   DIVERTICULAR DISEASE 11/26/2009   Annotation: noted in barium enema  Qualifier: Diagnosis of   By: Gladis FNP, Delorise      Replacing diagnoses that were inactivated after the 06/07/22 regulatory import   FREQUENCY, URINARY 02/19/2009   Qualifier: Diagnosis of   By: Gladis FNP, Nykedtra       Hypercholesteremia    Hypertension    Hypertension    MENOPAUSE, SURGICAL 03/08/2004   Qualifier: Diagnosis of   By: Gladis FNP, Nykedtra       OAB (overactive bladder)    Rheumatoid arthritis (HCC)      Current Outpatient Medications:    ciclopirox  (PENLAC ) 8 % solution, Apply topically at bedtime. Apply over nail and surrounding skin. Apply daily over previous coat. After seven (7) days, may remove with alcohol and continue cycle. Repeat until nail clears., Disp: 6.6 mL, Rfl: 0   diclofenac  Sodium (VOLTAREN ) 1 % GEL, Apply 2 g topically 4 (four) times daily., Disp: 100 g, Rfl: 1   meloxicam  (MOBIC ) 15 MG tablet, Take 1 tablet (15 mg total) by mouth daily., Disp: 30 tablet, Rfl: 0   metFORMIN  (GLUCOPHAGE ) 500 MG tablet, Take 2 tablets (1,000 mg total) by mouth daily with breakfast., Disp: 120 tablet, Rfl: 5   pantoprazole  (PROTONIX ) 40 MG tablet, Take 1 tablet (40 mg total) by mouth daily., Disp: 30 tablet, Rfl: 2   rosuvastatin  (CRESTOR ) 20 MG tablet, Take 1 tablet (20 mg  total) by mouth daily., Disp: 90 tablet, Rfl: 3   traMADol  (ULTRAM ) 50 MG tablet, Take 1 tablet (50 mg total) by mouth every 6 (six) hours as needed., Disp: 15 tablet, Rfl: 0   triamterene -hydrochlorothiazide  (MAXZIDE -25) 37.5-25 MG tablet, Take 1 tablet by mouth daily., Disp: 90 tablet, Rfl: 3  Review of Systems:   Respiratory: Patient endorses runny nose MSK: Patient endorses hand numbness  Physical Exam:  Vitals:   03/22/23 1513  BP: 139/86  Pulse: 80  Temp: 98.4 F (36.9 C)  TempSrc: Oral  SpO2: 100%  Weight: 207 lb 8 oz (94.1 kg)  Height: 5' 11 (1.803 m)    General: Patient is sitting comfortably in the room  ENT: Nasal erythema appreciated, no oropharyngeal erythema exudate appreciated, clear drainage appreciated from nostrils Extremities: Left hand positive Tinel's sign and positive Phalen's test, 2+ radial pulses bilaterally, no rashes appreciated to bilateral upper extremities   Assessment & Plan:   Allergic rhinitis Patient presents to the clinic today with 2-week history of concerns of stuffy/runny nose, states that she feels like she is in a tunnel with her hearing, and states that when she yawns her throat hurts.  She states that this happens when the seasons started getting colder.  She does report having a history of allergic rhinitis.  She states in the past she used Nasacort  and Flonase , but is not been using recently.  She has tried Vicks which has not helped.  On exam, patient does not have any cobblestoning that I appreciate to her oropharynx.  Nares look erythematous.  I think likely given the length of time this is allergic rhinitis versus an infectious process.  She denies any fevers or chills.  Her nasal drainage is clear and not purulent.  Patient can benefit from intranasal steroids and daily antihistamine.  Plan: -Patient courage to do sinus rinses -Patient encouraged to start taking daily antihistamine -Patient encouraged to start taking intranasal  steroid such as Nasacort  or Flonase   Carpal tunnel syndrome Patient presents with concerns of numbness in her left hand.  She states it starts in her thumb, index finger, and middle finger.  She denies any rashes or injury to the area.  She states that this has been going on for many months now.  She states she has worked at [employer], [employer], and [employer] and uses her hands a lot at work.  She states she has tried Advil  which has not helped.  She denies any changes of color to her hands.  She denies any feeling of coldness to her fingers.  On exam, patient has a positive Tinel's sign and Phalen's test.  Likely patient has carpal tunnel syndrome.  On my exam I do not appreciate any thenar atrophy.  Do not think this is severe at this time.  Will start with conservative treatment with splinting.  Plan: -Encouraged patient to wear splint nightly   -If patient does not improve, could potentially referred for injections or release procedure  Patient discussed with Dr. Jerrell Libby Blanch, DO PGY-2 Internal Medicine Resident  Pager: [phone]

## 2023-03-22 NOTE — Assessment & Plan Note (Signed)
 Patient presents with concerns of numbness in her left hand.  She states it starts in her thumb, index finger, and middle finger.  She denies any rashes or injury to the area.  She states that this has been going on for many months now.  She states she has worked at [employer], [employer], and [employer] and uses her hands a lot at work.  She states she has tried Advil  which has not helped.  She denies any changes of color to her hands.  She denies any feeling of coldness to her fingers.  On exam, patient has a positive Tinel's sign and Phalen's test.  Likely patient has carpal tunnel syndrome.  On my exam I do not appreciate any thenar atrophy.  Do not think this is severe at this time.  Will start with conservative treatment with splinting.  Plan: -Encouraged patient to wear splint nightly   -If patient does not improve, could potentially referred for injections or release procedure

## 2023-03-24 NOTE — Progress Notes (Signed)
 Internal Medicine Clinic Attending  Case discussed with the resident physician at the time of the visit.  We reviewed the patient's history, exam, and pertinent patient test results.  I agree with the assessment, diagnosis, and plan of care documented in the resident's note.

## 2023-04-11 ENCOUNTER — Other Ambulatory Visit (HOSPITAL_COMMUNITY): Payer: Self-pay

## 2023-04-14 ENCOUNTER — Encounter: Payer: Self-pay | Admitting: Student

## 2023-04-14 ENCOUNTER — Other Ambulatory Visit (HOSPITAL_COMMUNITY): Payer: Self-pay

## 2023-04-14 ENCOUNTER — Ambulatory Visit: Payer: Commercial Managed Care - HMO | Admitting: Student

## 2023-04-14 VITALS — BP 159/60 | HR 64 | Temp 97.9°F | Ht 71.0 in | Wt 216.3 lb

## 2023-04-14 DIAGNOSIS — I10 Essential (primary) hypertension: Secondary | ICD-10-CM | POA: Diagnosis not present

## 2023-04-14 DIAGNOSIS — Z2821 Immunization not carried out because of patient refusal: Secondary | ICD-10-CM | POA: Diagnosis not present

## 2023-04-14 DIAGNOSIS — E1165 Type 2 diabetes mellitus with hyperglycemia: Secondary | ICD-10-CM | POA: Diagnosis not present

## 2023-04-14 DIAGNOSIS — Z7984 Long term (current) use of oral hypoglycemic drugs: Secondary | ICD-10-CM

## 2023-04-14 DIAGNOSIS — G5602 Carpal tunnel syndrome, left upper limb: Secondary | ICD-10-CM

## 2023-04-14 LAB — POCT GLYCOSYLATED HEMOGLOBIN (HGB A1C): Hemoglobin A1C: 7.6 % — AB (ref 4.0–5.6)

## 2023-04-14 LAB — GLUCOSE, CAPILLARY: Glucose-Capillary: 236 mg/dL — ABNORMAL HIGH (ref 70–99)

## 2023-04-14 MED ORDER — EMPAGLIFLOZIN 10 MG PO TABS
10.0000 mg | ORAL_TABLET | Freq: Every day | ORAL | 5 refills | Status: DC
Start: 2023-04-14 — End: 2023-05-23
  Filled 2023-04-14: qty 30, 30d supply, fill #0
  Filled 2023-05-23 (×2): qty 90, 90d supply, fill #1

## 2023-04-14 MED ORDER — LOSARTAN POTASSIUM-HCTZ 50-12.5 MG PO TABS
1.0000 | ORAL_TABLET | Freq: Every day | ORAL | 11 refills | Status: DC
Start: 2023-04-14 — End: 2023-05-23
  Filled 2023-04-14: qty 30, 30d supply, fill #0
  Filled 2023-05-23: qty 90, 90d supply, fill #1

## 2023-04-14 NOTE — Progress Notes (Signed)
 CC: HTN follow up, Diabetes follow up, Wrist pain follow up  HPI:  [name] is a 63 y.o. female with a past medical history of type 2 diabetes mellitus, hyperlipidemia, hypertension who presents for follow-up appointment. Please see assessment and plan for full HPI.   Medications: Hypertension: Losartan -hydrochlorothiazide  50-12.5 mg daily Hyperlipidemia: Crestor  20 mg daily GERD: Protonix  40 mg daily Diabetes: Metformin  1000 mg daily, Empagliflozin  10 mg daily   Past Medical History:  Diagnosis Date   Acute non-recurrent maxillary sinusitis 03/30/2021   Bilateral carpal tunnel syndrome    Bilateral hand pain 02/12/2020   DIVERTICULAR DISEASE 11/26/2009   Annotation: noted in barium enema  Qualifier: Diagnosis of   By: Gladis FNP, Delorise      Replacing diagnoses that were inactivated after the 06/07/22 regulatory import   FREQUENCY, URINARY 02/19/2009   Qualifier: Diagnosis of   By: Gladis FNP, Nykedtra       Hypercholesteremia    Hypertension    Hypertension    MENOPAUSE, SURGICAL 03/08/2004   Qualifier: Diagnosis of   By: Gladis FNP, Nykedtra       OAB (overactive bladder)    Rheumatoid arthritis (HCC)      Current Outpatient Medications:    empagliflozin  (JARDIANCE ) 10 MG TABS tablet, Take 1 tablet (10 mg total) by mouth daily before breakfast., Disp: 30 tablet, Rfl: 5   losartan -hydrochlorothiazide  (HYZAAR ) 50-12.5 MG tablet, Take 1 tablet by mouth daily., Disp: 30 tablet, Rfl: 11   ciclopirox  (PENLAC ) 8 % solution, Apply topically at bedtime. Apply over nail and surrounding skin. Apply daily over previous coat. After seven (7) days, may remove with alcohol and continue cycle. Repeat until nail clears., Disp: 6.6 mL, Rfl: 0   metFORMIN  (GLUCOPHAGE ) 500 MG tablet, Take 2 tablets (1,000 mg total) by mouth daily with breakfast., Disp: 120 tablet, Rfl: 5   pantoprazole  (PROTONIX ) 40 MG tablet, Take 1 tablet (40 mg total) by mouth daily., Disp: 30 tablet, Rfl: 2    rosuvastatin  (CRESTOR ) 20 MG tablet, Take 1 tablet (20 mg total) by mouth daily., Disp: 90 tablet, Rfl: 3  Review of Systems:    MSK: Patient endorse left wrist pain and numbness in fingers  Physical Exam:  Vitals:   04/14/23 1459 04/14/23 1528  BP: (!) 159/52 (!) 159/60  Pulse: 70 64  Temp: 97.9 F (36.6 C)   TempSrc: Oral   SpO2: 99%   Weight: 216 lb 4.8 oz (98.1 kg)   Height: 5' 11 (1.803 m)    General: Patient is sitting comfortably in the room  Cardio: Regular rate and rhythm, no murmurs, rubs or gallops Pulmonary: Clear to ausculation bilaterally with no rales, rhonchi, and crackles  MSK: Left wrist in splint    Assessment & Plan:   Essential hypertension Patient presents today for HTN follow up. Her current medications include triamterene -hydrochlorothiazide  37.5-25 mg daily. She denies any headaches, shortness of breath, vision changes, or chest pain. Her BP today is 159/52 which is much above goal of <130/80. She states that she is willing to start a medication if need be. Given that she is a diabetic, it would be a good idea to start her on ARB therapy.  Plan: -Stop triamterene -hydrochlorothiazide  -Start Losartan -hydrochlorothiazide  50-12.5 mg daily  -Patient encouraged to keep a blood pressure log and bring back in a month -If in month patient still has elevated pressures can increase to 2 tablet daily  -Check BMP in one month   Type 2 diabetes mellitus with  hyperglycemia, without long-term current use of insulin [facility]) Patient presents to the clinic today for follow up on diabetes. She notes that her medication at this time is metformin  1000 mg daily. She endorses adherence to this with no concerns. Her A1c did increase from 7.3 to 7.6 today. Discussed this with patient and went over treatment options. She did not want to increase her metformin  but she did agree to start empagliflozin .   Plan: -Referred patient to ophthalmology for dilated retinal exam -Continue  metformin  1000 mg daily  -Start empagliflozin  10 mg daily  -Return in 3 months for A1c check   Carpal tunnel syndrome Patient was seen by me in the middle of January for concerns of left wrist pain thought to be carpal tunnel syndrome. She was instructed to get a wrist splint and she was wearing today on her left wrist. She states that she has not improved despite wearing the splint. At this time I want to confirm the diagnosis of carpal tunnel syndrome with a nerve conduction test.  Plan: -Continue splint  -Refer for nerve conduction test -If positive, will refer to sports medicine for procedure as conservative treatment has not proven effective  Flu vaccine refused Patient declined flu vaccine today.   Patient discussed with Dr. Jerrell Libby Blanch, DO PGY-2 Internal Medicine Resident  Pager: [phone]

## 2023-04-14 NOTE — Assessment & Plan Note (Signed)
 Patient was seen by me in the middle of January for concerns of left wrist pain thought to be carpal tunnel syndrome. She was instructed to get a wrist splint and she was wearing today on her left wrist. She states that she has not improved despite wearing the splint. At this time I want to confirm the diagnosis of carpal tunnel syndrome with a nerve conduction test.  Plan: -Continue splint  -Refer for nerve conduction test -If positive, will refer to sports medicine for procedure as conservative treatment has not proven effective

## 2023-04-14 NOTE — Patient Instructions (Addendum)
 [name],Thank you for allowing me to take part in your care today.  Here are your instructions.  1.  Regarding your blood pressure.  Stop taking your current blood pressure medicine.  Started new medication called losartan -HCTZ.  This is a once a day medication.  Take 50-12.5 mg daily.  2.  Regarding your diabetes, continue taking metformin  1000 mg daily.  Your A1c was 7.6 today.  Start taking Jardiance  10 mg daily. Send 3.  I referred you for diabetic eye exam.  4.  Come back in 1 month and we will check your labs.  Thank you, Dr. Tobie  If you have any other questions please contact the internal medicine clinic at [phone] If it is after hours, please call the [facility] at [phone] and then ask the person who picks up for the resident on call.

## 2023-04-14 NOTE — Assessment & Plan Note (Signed)
 Patient presents to the clinic today for follow up on diabetes. She notes that her medication at this time is metformin  1000 mg daily. She endorses adherence to this with no concerns. Her A1c did increase from 7.3 to 7.6 today. Discussed this with patient and went over treatment options. She did not want to increase her metformin  but she did agree to start empagliflozin .   Plan: -Referred patient to ophthalmology for dilated retinal exam -Continue metformin  1000 mg daily  -Start empagliflozin  10 mg daily  -Return in 3 months for A1c check

## 2023-04-14 NOTE — Assessment & Plan Note (Addendum)
 Patient presents today for HTN follow up. Her current medications include triamterene -hydrochlorothiazide  37.5-25 mg daily. She denies any headaches, shortness of breath, vision changes, or chest pain. Her BP today is 159/52 which is much above goal of <130/80. She states that she is willing to start a medication if need be. Given that she is a diabetic, it would be a good idea to start her on ARB therapy.  Plan: -Stop triamterene -hydrochlorothiazide  -Start Losartan -hydrochlorothiazide  50-12.5 mg daily  -Patient encouraged to keep a blood pressure log and bring back in a month -If in month patient still has elevated pressures can increase to 2 tablet daily  -Check BMP in one month

## 2023-04-14 NOTE — Assessment & Plan Note (Signed)
Patient declined flu vaccine today.

## 2023-04-15 NOTE — Progress Notes (Signed)
 Internal Medicine Clinic Attending  Case discussed with the resident physician at the time of the visit.  We reviewed the patient's history, exam, and pertinent patient test results.  I agree with the assessment, diagnosis, and plan of care documented in the resident's note.

## 2023-05-16 ENCOUNTER — Encounter: Payer: Commercial Managed Care - HMO | Admitting: Internal Medicine

## 2023-05-16 ENCOUNTER — Encounter: Payer: Self-pay | Admitting: Internal Medicine

## 2023-05-16 NOTE — Progress Notes (Deleted)
 Subjective:  CC: Blood pressure  HPI:  [name] is a 63 y.o. female with a past medical history of hypertension, diabetes, HLD who presents today for follow-up on blood pressure.  She was seen by her primary care doctor February 6.  At that time her medications were switched from triamterene HCTZ combo pill to losartan HCTZ combo pill.  Please see problem based assessment and plan for additional details.  Past Medical History:  Diagnosis Date   Acute non-recurrent maxillary sinusitis 03/30/2021   Bilateral carpal tunnel syndrome    DIVERTICULAR DISEASE 11/26/2009   Annotation: noted in barium enema  Qualifier: Diagnosis of   By: Daphine Deutscher FNP, Zena Amos      Replacing diagnoses that were inactivated after the 06/07/22 regulatory import   Hypercholesteremia    Hypertension    MENOPAUSE, SURGICAL 03/08/2004   Qualifier: Diagnosis of   By: Daphine Deutscher FNP, Nykedtra       OAB (overactive bladder)    Rheumatoid arthritis (HCC)     MEDICATIONS:  Losartan-HCTZ 50-12.5 mg daily Metformin at 1000 mg daily Jardiance 10 mg daily Crestor 20 mg daily Pantoprazole 40 mg daily  Family History  Problem Relation Age of Onset   Hypertension Mother    Diabetes Mother    Cancer Father    Diabetes Brother    Colon cancer Neg Hx    Rectal cancer Neg Hx    Stomach cancer Neg Hx     Past Surgical History:  Procedure Laterality Date   ABDOMINAL HYSTERECTOMY     ANKLE FRACTURE SURGERY Left      Social History   Socioeconomic History   Marital status: Single    Spouse name: Not on file   Number of children: 2   Years of education: Not on file   Highest education level: 11th grade  Occupational History   Not on file  Tobacco Use   Smoking status: Never   Smokeless tobacco: Never  Vaping Use   Vaping status: Never Used  Substance and Sexual Activity   Alcohol use: Yes    Alcohol/week: 3.0 standard drinks of alcohol    Types: 3 Cans of beer per week   Drug use: Yes     Frequency: 2.0 times per week    Types: Marijuana   Sexual activity: Not Currently    Birth control/protection: Surgical  Other Topics Concern   Not on file  Social History Narrative   Not on file   Social Drivers of Health   Financial Resource Strain: Not on file  Food Insecurity: Food Insecurity Present (07/05/2022)   Hunger Vital Sign    Worried About Running Out of Food in the Last Year: Sometimes true    Ran Out of Food in the Last Year: Sometimes true  Transportation Needs: No Transportation Needs (06/21/2019)   PRAPARE - Administrator, Civil Service (Medical): No    Lack of Transportation (Non-Medical): No  Physical Activity: Not on file  Stress: Not on file  Social Connections: Socially Isolated (07/05/2022)   Social Connection and Isolation Panel [NHANES]    Frequency of Communication with Friends and Family: More than three times a week    Frequency of Social Gatherings with Friends and Family: Twice a week    Attends Religious Services: Never    Database administrator or Organizations: No    Attends Banker Meetings: Never    Marital Status: Never married  Intimate Partner Violence: Not At  Risk (07/05/2022)   Humiliation, Afraid, Rape, and Kick questionnaire    Fear of Current or Ex-Partner: No    Emotionally Abused: No    Physically Abused: No    Sexually Abused: No    Review of Systems: ROS negative except for what is noted on the assessment and plan.  Objective:  There were no vitals filed for this visit.  Physical Exam: Constitutional: well-appearing *** sitting in ***, in no acute distress HENT: normocephalic atraumatic, mucous membranes moist Eyes: conjunctiva non-erythematous Neck: supple Cardiovascular: regular rate and rhythm, no m/r/g Pulmonary/Chest: normal work of breathing on room air, lungs clear to auscultation bilaterally Abdominal: soft, non-tender, non-distended MSK: normal bulk and tone Neurological: alert &  oriented x 3, 5/5 strength in bilateral upper and lower extremities, normal gait Skin: warm and dry Psych: ***     Assessment & Plan:  No problem-specific Assessment & Plan notes found for this encounter.   Hypertension  -BMP  Current medications include Crestor 40 mg.  She is adherent with this medication. -Lipid panel  Diabetes Tolerating Jardiance well? Ophthalmology  Nerve conduction study ordered but not scheduled yet  Patient discussed with Dr. {[id number]::"[id number]","G. Hoffman","Mullen","Narendra","Machen","Vincent","Guilloud","Lau"}   [employer], D.O. [facility] Internal Medicine  PGY-3 Pager: [phone]  Phone: [phone] Date 05/16/2023  Time 7:42 AM

## 2023-05-23 ENCOUNTER — Other Ambulatory Visit: Payer: Self-pay

## 2023-05-23 ENCOUNTER — Ambulatory Visit: Admitting: Internal Medicine

## 2023-05-23 ENCOUNTER — Other Ambulatory Visit (HOSPITAL_COMMUNITY): Payer: Self-pay

## 2023-05-23 VITALS — BP 132/63 | HR 86 | Temp 98.1°F | Ht 71.0 in | Wt 210.7 lb

## 2023-05-23 DIAGNOSIS — K219 Gastro-esophageal reflux disease without esophagitis: Secondary | ICD-10-CM

## 2023-05-23 DIAGNOSIS — E1165 Type 2 diabetes mellitus with hyperglycemia: Secondary | ICD-10-CM | POA: Diagnosis not present

## 2023-05-23 DIAGNOSIS — E1169 Type 2 diabetes mellitus with other specified complication: Secondary | ICD-10-CM

## 2023-05-23 DIAGNOSIS — Z7984 Long term (current) use of oral hypoglycemic drugs: Secondary | ICD-10-CM

## 2023-05-23 DIAGNOSIS — E785 Hyperlipidemia, unspecified: Secondary | ICD-10-CM

## 2023-05-23 DIAGNOSIS — I10 Essential (primary) hypertension: Secondary | ICD-10-CM | POA: Diagnosis not present

## 2023-05-23 DIAGNOSIS — Z1231 Encounter for screening mammogram for malignant neoplasm of breast: Secondary | ICD-10-CM

## 2023-05-23 MED ORDER — EMPAGLIFLOZIN 10 MG PO TABS
10.0000 mg | ORAL_TABLET | Freq: Every day | ORAL | 3 refills | Status: DC
Start: 2023-05-23 — End: 2023-10-12
  Filled 2023-05-23 (×3): qty 30, 30d supply, fill #0
  Filled 2023-05-23: qty 90, 90d supply, fill #0
  Filled 2023-06-16: qty 30, 30d supply, fill #1
  Filled 2023-08-01: qty 30, 30d supply, fill #2
  Filled 2023-09-06: qty 30, 30d supply, fill #3
  Filled 2023-10-07: qty 30, 30d supply, fill #4

## 2023-05-23 MED ORDER — LOSARTAN POTASSIUM-HCTZ 50-12.5 MG PO TABS
1.0000 | ORAL_TABLET | Freq: Every day | ORAL | 3 refills | Status: AC
Start: 2023-05-23 — End: ?
  Filled 2023-05-23: qty 30, 30d supply, fill #0
  Filled 2023-05-23: qty 90, 90d supply, fill #0
  Filled 2023-06-28: qty 30, 30d supply, fill #1
  Filled 2023-08-01: qty 30, 30d supply, fill #2
  Filled 2023-09-08: qty 30, 30d supply, fill #3
  Filled 2023-10-12: qty 30, 30d supply, fill #4
  Filled 2023-11-07: qty 30, 30d supply, fill #5
  Filled 2023-12-12: qty 30, 30d supply, fill #6
  Filled 2024-01-17: qty 30, 30d supply, fill #7
  Filled 2024-02-27: qty 30, 30d supply, fill #8
  Filled 2024-04-03: qty 30, 30d supply, fill #9

## 2023-05-23 MED ORDER — PANTOPRAZOLE SODIUM 40 MG PO TBEC
40.0000 mg | DELAYED_RELEASE_TABLET | Freq: Every day | ORAL | 2 refills | Status: DC | PRN
Start: 2023-05-23 — End: 2023-10-19
  Filled 2023-05-23 – 2023-06-02 (×2): qty 30, 30d supply, fill #0
  Filled 2023-07-04 (×2): qty 30, 30d supply, fill #1
  Filled 2023-08-10: qty 30, 30d supply, fill #2

## 2023-05-23 MED ORDER — ROSUVASTATIN CALCIUM 20 MG PO TABS
20.0000 mg | ORAL_TABLET | Freq: Every day | ORAL | 3 refills | Status: AC
Start: 2023-05-23 — End: ?
  Filled 2023-05-23 (×2): qty 30, 30d supply, fill #0
  Filled 2023-05-23: qty 90, 90d supply, fill #0
  Filled 2023-06-16: qty 30, 30d supply, fill #1
  Filled 2023-08-01: qty 30, 30d supply, fill #2
  Filled 2023-08-24: qty 30, 30d supply, fill #3
  Filled 2023-10-07: qty 30, 30d supply, fill #4
  Filled 2023-10-12 – 2023-11-07 (×2): qty 30, 30d supply, fill #5
  Filled 2023-12-12: qty 30, 30d supply, fill #6
  Filled 2024-01-17: qty 30, 30d supply, fill #7
  Filled 2024-02-27: qty 30, 30d supply, fill #8
  Filled 2024-04-03: qty 30, 30d supply, fill #9

## 2023-05-23 NOTE — Progress Notes (Signed)
 Subjective:  CC: Blood pressure  HPI:  [name] is a 63 y.o. female with a past medical history of hypertension, diabetes, HLD who presents today for follow-up on blood pressure.  She was seen by her primary care doctor February 6.  At that time her medications were switched from triamterene HCTZ combo pill to losartan HCTZ combo pill.  Please see problem based assessment and plan for additional details.  Past Medical History:  Diagnosis Date   Acute non-recurrent maxillary sinusitis 03/30/2021   Bilateral carpal tunnel syndrome    DIVERTICULAR DISEASE 11/26/2009   Annotation: noted in barium enema  Qualifier: Diagnosis of   By: Daphine Deutscher FNP, Zena Amos      Replacing diagnoses that were inactivated after the 06/07/22 regulatory import   Hypercholesteremia    Hypertension    MENOPAUSE, SURGICAL 03/08/2004   Qualifier: Diagnosis of   By: Daphine Deutscher FNP, Nykedtra       OAB (overactive bladder)    Rheumatoid arthritis (HCC)     MEDICATIONS:  Losartan-HCTZ 50-12.5 mg daily Metformin at 1000 mg daily Jardiance 10 mg daily Crestor 20 mg daily Pantoprazole 40 mg daily  Family History  Problem Relation Age of Onset   Hypertension Mother    Diabetes Mother    Cancer Father    Diabetes Brother    Colon cancer Neg Hx    Rectal cancer Neg Hx    Stomach cancer Neg Hx     Past Surgical History:  Procedure Laterality Date   ABDOMINAL HYSTERECTOMY     ANKLE FRACTURE SURGERY Left      Social History   Socioeconomic History   Marital status: Single    Spouse name: Not on file   Number of children: 2   Years of education: Not on file   Highest education level: 11th grade  Occupational History   Not on file  Tobacco Use   Smoking status: Never   Smokeless tobacco: Never  Vaping Use   Vaping status: Never Used  Substance and Sexual Activity   Alcohol use: Yes    Alcohol/week: 3.0 standard drinks of alcohol    Types: 3 Cans of beer per week   Drug use: Yes     Frequency: 2.0 times per week    Types: Marijuana   Sexual activity: Not Currently    Birth control/protection: Surgical  Other Topics Concern   Not on file  Social History Narrative   Not on file   Social Drivers of Health   Financial Resource Strain: Not on file  Food Insecurity: Food Insecurity Present (07/05/2022)   Hunger Vital Sign    Worried About Running Out of Food in the Last Year: Sometimes true    Ran Out of Food in the Last Year: Sometimes true  Transportation Needs: No Transportation Needs (06/21/2019)   PRAPARE - Administrator, Civil Service (Medical): No    Lack of Transportation (Non-Medical): No  Physical Activity: Not on file  Stress: Not on file  Social Connections: Socially Isolated (07/05/2022)   Social Connection and Isolation Panel [NHANES]    Frequency of Communication with Friends and Family: More than three times a week    Frequency of Social Gatherings with Friends and Family: Twice a week    Attends Religious Services: Never    Database administrator or Organizations: No    Attends Banker Meetings: Never    Marital Status: Never married  Intimate Partner Violence: Not At  Risk (07/05/2022)   Humiliation, Afraid, Rape, and Kick questionnaire    Fear of Current or Ex-Partner: No    Emotionally Abused: No    Physically Abused: No    Sexually Abused: No    Review of Systems: ROS negative except for what is noted on the assessment and plan.  Objective:   Vitals:   05/23/23 0911  BP: 132/63  Pulse: 86  Temp: 98.1 F (36.7 C)  TempSrc: Oral  SpO2: 100%  Weight: 210 lb 11.2 oz (95.6 kg)  Height: 5\' 11"  (1.803 m)    Physical Exam: Constitutional: well-appearing, in no acute distress Cardiovascular: regular rate and rhythm, no m/r/g Pulmonary/Chest: normal work of breathing on room air, lungs clear to auscultation bilaterally Abdominal: soft, non-tender, non-distended MSK: normal bulk and tone Neurological: normal  gait Skin: warm and dry   Assessment & Plan:  Essential hypertension Blood pressure is well controlled on new combination pill with losartan- hydrochlorothiazide.  -continue losartan -hydrochlorothiazide 50-12.5mg  -BMP  Hyperlipidemia associated with type 2 diabetes mellitus (HCC) Current medications include Crestor 40 mg.  She has been out of crestor for last month. -Lipid panel  Type 2 diabetes mellitus with hyperglycemia, without long-term current use of insulin (HCC) She is tolerating jardiance and metformin well. She will be due for repeat A1c in 2 months.  GERD Patient reports that acid reflux symptoms are well controlled and she infrequently takes pantoprazole. We talked about switching to H2 receptor blocker for PRN dosing but she would rather remain on pantoprazole as needed at this time. -continue pantoprazole 40 mg PRN  Patient discussed with Dr. Cleda Daub   Ciro Tashiro, D.O. [facility] Internal Medicine  PGY-3 Pager: [phone]  Phone: [phone] Date 05/23/2023  Time 11:50 AM

## 2023-05-23 NOTE — Assessment & Plan Note (Signed)
 She is tolerating jardiance and metformin well. She will be due for repeat A1c in 2 months.

## 2023-05-23 NOTE — Assessment & Plan Note (Signed)
 Patient reports that acid reflux symptoms are well controlled and she infrequently takes pantoprazole. We talked about switching to H2 receptor blocker for PRN dosing but she would rather remain on pantoprazole as needed at this time. -continue pantoprazole 40 mg PRN

## 2023-05-23 NOTE — Assessment & Plan Note (Signed)
 Current medications include Crestor 40 mg.  She has been out of crestor for last month. -Lipid panel

## 2023-05-23 NOTE — Patient Instructions (Signed)
 Thank you, [name] for allowing [place] to provide your care today.   Your blood pressure is well controlled today. Please continue taking medications. I would try calling your insurance company to check in about their requirement to pay for 3 months at at time.  I will call with results of your blood work in the next day or 2.  If you are taking pantoprazole as needed, I think that a medication called famotidine would be more beneficial for you.   I have ordered the following labs for you:  Lab Orders         Lipid Profile         BMP8+Anion Gap      I have ordered the following medication/changed the following medications:   Stop the following medications: Medications Discontinued During This Encounter  Medication Reason   ciclopirox (PENLAC) 8 % solution    rosuvastatin (CRESTOR) 20 MG tablet Reorder   losartan-hydrochlorothiazide (HYZAAR) 50-12.5 MG tablet    empagliflozin (JARDIANCE) 10 MG TABS tablet      Start the following medications: Meds ordered this encounter  Medications   rosuvastatin (CRESTOR) 20 MG tablet    Sig: Take 1 tablet (20 mg total) by mouth daily.    Dispense:  90 tablet    Refill:  3   empagliflozin (JARDIANCE) 10 MG TABS tablet    Sig: Take 1 tablet (10 mg total) by mouth daily before breakfast.    Dispense:  90 tablet    Refill:  3    ok to fill 90ds per ins req   losartan-hydrochlorothiazide (HYZAAR) 50-12.5 MG tablet    Sig: Take 1 tablet by mouth daily.    Dispense:  90 tablet    Refill:  3     Follow up: 2 months   We look forward to seeing you next time. Please call our clinic at [phone] if you have any questions or concerns. The best time to call is Monday-Friday from 9am-4pm, but there is someone available 24/7. If after hours or the weekend, call the main hospital number and ask for the Internal Medicine Resident On-Call. If you need medication refills, please notify your pharmacy one week in advance and they will send [place] a  request.   Thank you for trusting me with your care. Wishing you the best!   Rudene Christians, DO [facility]

## 2023-05-23 NOTE — Assessment & Plan Note (Signed)
 Blood pressure is well controlled on new combination pill with losartan- hydrochlorothiazide.  -continue losartan -hydrochlorothiazide 50-12.5mg  -BMP

## 2023-05-24 ENCOUNTER — Other Ambulatory Visit: Payer: Self-pay

## 2023-05-24 LAB — LIPID PANEL
Chol/HDL Ratio: 2.9 ratio (ref 0.0–4.4)
Cholesterol, Total: 228 mg/dL — ABNORMAL HIGH (ref 100–199)
HDL: 78 mg/dL (ref >39)
LDL Chol Calc (NIH): 122 mg/dL — ABNORMAL HIGH (ref 0–99)
Triglycerides: 162 mg/dL — ABNORMAL HIGH (ref 0–149)
VLDL Cholesterol Cal: 28 mg/dL (ref 5–40)

## 2023-05-24 LAB — BMP8+ANION GAP
Anion Gap: 16 mmol/L (ref 10.0–18.0)
BUN/Creatinine Ratio: 19 (ref 12–28)
BUN: 12 mg/dL (ref 8–27)
CO2: 21 mmol/L (ref 20–29)
Calcium: 9.2 mg/dL (ref 8.7–10.3)
Chloride: 102 mmol/L (ref 96–106)
Creatinine, Ser: 0.64 mg/dL (ref 0.57–1.00)
Glucose: 162 mg/dL — ABNORMAL HIGH (ref 70–99)
Potassium: 4.3 mmol/L (ref 3.5–5.2)
Sodium: 139 mmol/L (ref 134–144)
eGFR: 100 mL/min/{1.73_m2} (ref >59)

## 2023-05-31 ENCOUNTER — Other Ambulatory Visit: Payer: Self-pay

## 2023-06-01 NOTE — Progress Notes (Signed)
 Internal Medicine Clinic Attending  Case discussed with the resident at the time of the visit.  We reviewed the resident's history and exam and pertinent patient test results.  I agree with the assessment, diagnosis, and plan of care documented in the resident's note.

## 2023-06-02 ENCOUNTER — Telehealth: Payer: Self-pay | Admitting: *Deleted

## 2023-06-02 ENCOUNTER — Other Ambulatory Visit (HOSPITAL_COMMUNITY): Payer: Self-pay

## 2023-06-02 ENCOUNTER — Ambulatory Visit: Payer: Self-pay | Admitting: Student

## 2023-06-02 ENCOUNTER — Encounter: Payer: Self-pay | Admitting: Internal Medicine

## 2023-06-02 VITALS — BP 130/76 | HR 79 | Temp 98.7°F | Ht 71.0 in | Wt 210.0 lb

## 2023-06-02 DIAGNOSIS — K219 Gastro-esophageal reflux disease without esophagitis: Secondary | ICD-10-CM | POA: Diagnosis not present

## 2023-06-02 DIAGNOSIS — K5900 Constipation, unspecified: Secondary | ICD-10-CM

## 2023-06-02 NOTE — Telephone Encounter (Signed)
 Mammogram appointment 07/04/2023 @ 8:40 am / arrive 8:20 m . [facility] center. Appointment mailed to the patient and she was contacted and aware of 24 hour prior notice to cancel or reschedule and the 75.00 no show fee.

## 2023-06-02 NOTE — Assessment & Plan Note (Signed)
 Patient has had intermittent constipation for the last couple years.  Most recently, it has been transparent for the last month.  She states that she has a bowel movement every 2 to 3 days, and when she does have a bowel movement very minimum comes out.  This is not affected her diet, as most of her diet is fast food.  She denies any hematochezia or melena.  She denies any abdominal pain.  Overall, I think her constipation may be related to her diet.  Provided patient with handout of high-fiber foods that she can try eating.  Also low threshold for her to start taking MiraLAX for regular bowel movements, and she is aware of this.  Plan: - Increase dietary fiber

## 2023-06-02 NOTE — Patient Instructions (Signed)
 Thank you so much for coming to the clinic today!   For your constipation, I am attaching a pamphlet for a high-fiber diet, if that does not work you can try picking up MiraLAX over-the-counter which should help.  For the bad taste in your mouth, I think is probably related to your reflux and I would recommend starting to take the 40mg  once a day, and seeing if that controls her symptoms.  If it does not we may need to do further studies.   If you have any questions please feel free to the call the clinic at anytime at [phone]. It was a pleasure seeing you!  Best, Dr. Thomasene Ripple

## 2023-06-02 NOTE — Assessment & Plan Note (Signed)
 Patient states she has been having this "bad taste in her mouth" for the last couple weeks.  She has a diagnosis of gastroesophageal reflux disease, for which she is supposed to be taking pantoprazole once daily for it.  She has been taking this medication twice daily, and subsequently run out of it two weeks ago.  She does have a history of H. pylori as well, which was treated properly.    I think her symptoms are most likely in the setting of not being able to take her pantoprazole.  I counseled her on only taking it once a day, and if her symptoms are well-controlled with that.  If she needs pantoprazole 40 mg twice a day to control her symptoms, she may need further evaluation with an endoscopy.  Likely, she is not having any red flag symptoms such as weight loss, or dysphagia.   Plan:  -Pantoprazole 40 mg once a day

## 2023-06-02 NOTE — Progress Notes (Signed)
 CC: Acute visit for constipation  HPI:  [name] is a 63 y.o. female living with a history stated below and presents today for constipation. Please see problem based assessment and plan for additional details.  Past Medical History:  Diagnosis Date   Acute non-recurrent maxillary sinusitis 03/30/2021   Bilateral carpal tunnel syndrome    DIVERTICULAR DISEASE 11/26/2009   Annotation: noted in barium enema  Qualifier: Diagnosis of   By: Daphine Deutscher FNP, Zena Amos      Replacing diagnoses that were inactivated after the 06/07/22 regulatory import   Hypercholesteremia    Hypertension    MENOPAUSE, SURGICAL 03/08/2004   Qualifier: Diagnosis of   By: Daphine Deutscher FNP, [place]       OAB (overactive bladder)    Rheumatoid arthritis (HCC)     Current Outpatient Medications on File Prior to Visit  Medication Sig Dispense Refill   empagliflozin (JARDIANCE) 10 MG TABS tablet Take 1 tablet (10 mg total) by mouth daily before breakfast. 90 tablet 3   losartan-hydrochlorothiazide (HYZAAR) 50-12.5 MG tablet Take 1 tablet by mouth daily. 90 tablet 3   metFORMIN (GLUCOPHAGE) 500 MG tablet Take 2 tablets (1,000 mg total) by mouth daily with breakfast. 120 tablet 5   pantoprazole (PROTONIX) 40 MG tablet Take 1 tablet (40 mg total) by mouth daily as needed. 30 tablet 2   rosuvastatin (CRESTOR) 20 MG tablet Take 1 tablet (20 mg total) by mouth daily. 90 tablet 3   No current facility-administered medications on file prior to visit.    Family History  Problem Relation Age of Onset   Hypertension Mother    Diabetes Mother    Cancer Father    Diabetes Brother    Colon cancer Neg Hx    Rectal cancer Neg Hx    Stomach cancer Neg Hx     Social History   Socioeconomic History   Marital status: Single    Spouse name: Not on file   Number of children: 2   Years of education: Not on file   Highest education level: 11th grade  Occupational History   Not on file  Tobacco Use   Smoking status:  Never   Smokeless tobacco: Never  Vaping Use   Vaping status: Never Used  Substance and Sexual Activity   Alcohol use: Yes    Alcohol/week: 3.0 standard drinks of alcohol    Types: 3 Cans of beer per week   Drug use: Yes    Frequency: 2.0 times per week    Types: Marijuana   Sexual activity: Not Currently    Birth control/protection: Surgical  Other Topics Concern   Not on file  Social History Narrative   Not on file   Social Drivers of Health   Financial Resource Strain: Not on file  Food Insecurity: Food Insecurity Present (07/05/2022)   Hunger Vital Sign    Worried About Running Out of Food in the Last Year: Sometimes true    Ran Out of Food in the Last Year: Sometimes true  Transportation Needs: No Transportation Needs (06/21/2019)   PRAPARE - Administrator, Civil Service (Medical): No    Lack of Transportation (Non-Medical): No  Physical Activity: Not on file  Stress: Not on file  Social Connections: Socially Isolated (07/05/2022)   Social Connection and Isolation Panel [NHANES]    Frequency of Communication with Friends and Family: More than three times a week    Frequency of Social Gatherings with Friends and Family: Twice  a week    Attends Religious Services: Never    Active Member of Clubs or Organizations: No    Attends Banker Meetings: Never    Marital Status: Never married  Intimate Partner Violence: Not At Risk (07/05/2022)   Humiliation, Afraid, Rape, and Kick questionnaire    Fear of Current or Ex-Partner: No    Emotionally Abused: No    Physically Abused: No    Sexually Abused: No    Review of Systems: ROS negative except for what is noted on the assessment and plan.  Vitals:   06/02/23 1334  BP: 130/76  Pulse: 79  Temp: 98.7 F (37.1 C)  TempSrc: Oral  SpO2: 100%  Weight: 210 lb (95.3 kg)  Height: 5\' 11"  (1.803 m)    Physical Exam: Constitutional: well-appearing female in no acute distress Cardiovascular: regular  rate and rhythm, no m/r/g Pulmonary/Chest: normal work of breathing on room air, lungs clear to auscultation bilaterally Abdominal: soft, non-tender, non-distended  Assessment & Plan:   GERD Patient states she has been having this "bad taste in her mouth" for the last couple weeks.  She has a diagnosis of gastroesophageal reflux disease, for which she is supposed to be taking pantoprazole once daily for it.  She has been taking this medication twice daily, and subsequently run out of it two weeks ago.  She does have a history of H. pylori as well, which was treated properly.    I think her symptoms are most likely in the setting of not being able to take her pantoprazole.  I counseled her on only taking it once a day, and if her symptoms are well-controlled with that.  If she needs pantoprazole 40 mg twice a day to control her symptoms, she may need further evaluation with an endoscopy.  Likely, she is not having any red flag symptoms such as weight loss, or dysphagia.   Plan:  -Pantoprazole 40 mg once a day  Constipation Patient has had intermittent constipation for the last couple years.  Most recently, it has been transparent for the last month.  She states that she has a bowel movement every 2 to 3 days, and when she does have a bowel movement very minimum comes out.  This is not affected her diet, as most of her diet is fast food.  She denies any hematochezia or melena.  She denies any abdominal pain.  Overall, I think her constipation may be related to her diet.  Provided patient with handout of high-fiber foods that she can try eating.  Also low threshold for her to start taking MiraLAX for regular bowel movements, and she is aware of this.  Plan: - Increase dietary fiber   Patient discussed with Dr. Dierdre Forth Marcques Wrightsman, M.D. [facility], PGY-2 Pager: [phone] Date 06/02/2023 Time 2:17 PM

## 2023-06-03 NOTE — Progress Notes (Signed)
 Internal Medicine Clinic Attending  Case discussed with the resident at the time of the visit.  We reviewed the resident's history and exam and pertinent patient test results.  I agree with the assessment, diagnosis, and plan of care documented in the resident's note.    Add back Pantoprazole 40mg  daily. If this is not controlling patient's GERD symptoms, I would refer her to GI for EGD to evaluate further. She is not having epigastric pain.

## 2023-06-13 ENCOUNTER — Ambulatory Visit: Payer: Self-pay

## 2023-06-13 NOTE — Telephone Encounter (Signed)
 No available appts this week at this time. Pt has an appt 06/20/23.  I spoke to pt - instructed to go to UC if her symptoms worsen - stated she understands.

## 2023-06-13 NOTE — Telephone Encounter (Signed)
 Chief Complaint: bloating, constipation Symptoms: gas, bloating, constipation Frequency: unsure when this started, states this has bene an ongoing issue Pertinent Negatives: Patient denies fever, abd pain, blood in stool Disposition: [] ED /[] Urgent Care (no appt availability in office) / [x] Appointment(In office/virtual)/ []  [facility]/ [] Home Care/ [] Refused Recommended Disposition /[] [facility]/ []  Follow-up with PCP Additional Notes: Pt states that she has been seen recently and was told to take maalox. Pt states that she is still having a difficulty time using the restroom. Pt states that she has always had problems with constipation. Pt states that she does not remember what normal bowel routines are for her. Pt states she has been using pepto, RN advised to stop using pepto. Pt advised on care instructions per Epic. Call was placed to CAL d/t no appt until 4/14, protocol recommends being seen within 3 days. CAL will be calling the pt back to schedule.  Copied from CRM [id number]. Topic: Clinical - Medical Advice >> Jun 13, 2023  1:15 PM Adrianna P wrote: Reason for CRM: Patient said that maalox did not help her stomach. She is bloated. She is having a hard time going to the bathroom and would like to be contacted to see what else she can try. Reason for Disposition  [1] Constipation persists > 1 week AND [2] no improvement after using Care Advice  Answer Assessment - Initial Assessment Questions 1. STOOL PATTERN OR FREQUENCY: "How often do you have a bowel movement (BM)?"  (Normal range: 3 times a day to every 3 days)  "When was your last BM?"       I can't remember as it has been a long time 2. STRAINING: "Do you have to strain to have a BM?"      yes 3. RECTAL PAIN: "Does your rectum hurt when the stool comes out?" If Yes, ask: "Do you have hemorrhoids? How bad is the pain?"  (Scale 1-10; or mild, moderate, severe)     0 4. STOOL COMPOSITION: "Are the stools hard?"       Not hard hard, but not liquid 5. BLOOD ON STOOLS: "Has there been any blood on the toilet tissue or on the surface of the BM?" If Yes, ask: "When was the last time?"     denies 6. CHRONIC CONSTIPATION: "Is this a new problem for you?"  If No, ask: "How long have you had this problem?" (days, weeks, months)      yes 7. CHANGES IN DIET OR HYDRATION: "Have there been any recent changes in your diet?" "How much fluids are you drinking on a daily basis?"  "How much have you had to drink today?"     Thirsty all the time  8. MEDICINES: "Have you been taking any new medicines?" "Are you taking any narcotic pain medicines?" (e.g., Dilaudid, morphine, Percocet, Vicodin)     Another kind of BP pill, another diabetic pil  9. LAXATIVES: "Have you been using any stool softeners, laxatives, or enemas?"  If Yes, ask "What, how often, and when was the last time?"     Maalox, centri,  10. ACTIVITY:  "How much walking do you do every day?"  "Has your activity level decreased in the past week?"        denies 11. CAUSE: "What do you think is causing the constipation?"        unsure 12. OTHER SYMPTOMS: "Do you have any other symptoms?" (e.g., abdomen pain, bloating, fever, vomiting)  Bloating 13. MEDICAL HISTORY: "Do you have a history of hemorrhoids, rectal fissures, or rectal surgery or rectal abscess?"         denies  Protocols used: Constipation-A-AH

## 2023-06-16 ENCOUNTER — Other Ambulatory Visit (HOSPITAL_COMMUNITY): Payer: Self-pay

## 2023-06-16 ENCOUNTER — Other Ambulatory Visit: Payer: Self-pay

## 2023-06-16 ENCOUNTER — Ambulatory Visit: Admitting: Student

## 2023-06-16 VITALS — BP 123/73 | HR 68 | Temp 98.4°F | Resp 32 | Ht 71.0 in | Wt 209.7 lb

## 2023-06-16 DIAGNOSIS — M549 Dorsalgia, unspecified: Secondary | ICD-10-CM

## 2023-06-16 DIAGNOSIS — M546 Pain in thoracic spine: Secondary | ICD-10-CM | POA: Diagnosis not present

## 2023-06-16 DIAGNOSIS — K5900 Constipation, unspecified: Secondary | ICD-10-CM | POA: Diagnosis not present

## 2023-06-16 DIAGNOSIS — G5602 Carpal tunnel syndrome, left upper limb: Secondary | ICD-10-CM | POA: Diagnosis not present

## 2023-06-16 LAB — POCT URINALYSIS DIPSTICK
Bilirubin, UA: NEGATIVE
Blood, UA: NEGATIVE
Glucose, UA: POSITIVE — AB
Ketones, UA: NEGATIVE
Leukocytes, UA: NEGATIVE
Nitrite, UA: NEGATIVE
Protein, UA: NEGATIVE
Spec Grav, UA: 1.015 (ref 1.010–1.025)
Urobilinogen, UA: 0.2 U/dL
pH, UA: 5.5 (ref 5.0–8.0)

## 2023-06-16 MED ORDER — NAPROXEN 500 MG PO TABS
500.0000 mg | ORAL_TABLET | Freq: Two times a day (BID) | ORAL | 0 refills | Status: DC
  Filled 2023-06-16: qty 14, 7d supply, fill #0

## 2023-06-16 NOTE — Assessment & Plan Note (Signed)
 Patient presents with new acute lumbothoracic back pain on the right side that started 2 days ago.  She said that she woke up with the pain and it has since persisted.  She does have back pain at baseline, but it is usually in the lumbar region on the lumbothoracic region.  She has tried taking 800 mg of ibuprofen, which this does not help.  She denies relief with rest.  She denies fever, chills, hematuria, new shoes, new mattress.  In the past when she has had back pain, ibuprofen generally works for her.  I do suspect that this is MSK in origin; however, with the CVA tenderness on physical exam, will order a UA to evaluate for crystals/hematuria.  If crystals/hematuria present, will order CT scan to evaluate for renal stone. Plan: -Provided patient 1 week supply of naproxen 500 mg twice daily with alternating Tylenol -Patient was instructed to not use ibuprofen while taking naproxen -Patient reported that she will continue gentle stretches of her back -Patient was provided return precautions -U/A ordered to assess for signs of renal stone

## 2023-06-16 NOTE — Patient Instructions (Signed)
 Thank you, [name] for allowing [place] to provide your care today. Today we discussed back pain, carpal tunnel and constipation.    For the back pain: -Take one naproxen in the morning and one at night, please take this with food!  -Take tylenol as needed  -Continue gentle stretches and walking   I have ordered the following labs for you:   Lab Orders         POCT Urinalysis Dipstick (29562)       I have ordered the following medication/changed the following medications:   Stop the following medications: There are no discontinued medications.   Start the following medications: Meds ordered this encounter  Medications   naproxen (NAPROSYN) 500 MG tablet    Sig: Take 1 tablet (500 mg total) by mouth 2 (two) times daily with a meal.    Dispense:  14 tablet    Refill:  0       Remember: To call us if you have new fever, chills, the back pain does not improve and worsens.   Should you have any questions or concerns please call the internal medicine clinic at [phone].     Please note that our late policy has changed.  If you are more than 15 minutes late to your appointment, you may be asked to reschedule your appointment.  Dr. Hessie Diener, D.O. [facility]

## 2023-06-16 NOTE — Assessment & Plan Note (Signed)
 Patient's Symptoms have persisted despite wearing a left wrist splint for 2 months. She would like to see a hand surgeon to discuss additional options. -referral ordered

## 2023-06-16 NOTE — Progress Notes (Signed)
 Established Patient Office Visit  Subjective   Patient ID: [name], female    DOB: 1960-06-05  Age: 63 y.o. MRN: [id number]  Chief Complaint  Patient presents with   Follow-up    BM    [name] is a 63 y.o. who presents to the clinic for a two week follow up for constipation and back pain. Please see problem based assessment and plan for additional details.   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   Type 2 diabetes mellitus with hyperglycemia, without long-term current use of insulin (HCC) 03/30/2021   Hyperlipidemia associated with type 2 diabetes mellitus (HCC) 03/30/2021   Rheumatoid factor positive 12/10/2016   Chronic arthralgias of knees and hips 12/10/2016   Allergic rhinitis 10/16/2009   Constipation 03/11/2009   Essential hypertension 02/19/2009   GERD 02/19/2009      Objective:     BP 123/73 (BP Location: Right Arm, Patient Position: Sitting, Cuff Size: Normal)   Pulse 68   Temp 98.4 F (36.9 C) (Oral)   Resp (!) 32   Ht 5\' 11"  (1.803 m)   Wt 209 lb 11.2 oz (95.1 kg)   SpO2 100% Comment: room air  BMI 29.25 kg/m  BP Readings from Last 3 Encounters:  06/16/23 123/73  06/02/23 130/76  05/23/23 132/63   Wt Readings from Last 3 Encounters:  06/16/23 209 lb 11.2 oz (95.1 kg)  06/02/23 210 lb (95.3 kg)  05/23/23 210 lb 11.2 oz (95.6 kg)      Physical Exam Vitals reviewed.  Cardiovascular:     Rate and Rhythm: Normal rate and regular rhythm.     Heart sounds: Normal heart sounds.  Pulmonary:     Effort: Pulmonary effort is normal.     Breath sounds: Normal breath sounds.  Abdominal:     Tenderness: There is  right CVA tenderness.  Musculoskeletal:     Comments: No thenar atrophy present   Skin:    General: Skin is warm and dry.  Neurological:     Mental Status: She is alert.  Psychiatric:        Mood and Affect: Mood and affect normal.      Last metabolic panel Lab Results  Component Value Date   GLUCOSE 162 (H) 05/23/2023   NA 139 05/23/2023   K 4.3 05/23/2023   CL 102 05/23/2023   CO2 21 05/23/2023   BUN 12 05/23/2023   CREATININE 0.64 05/23/2023   EGFR 100 05/23/2023   CALCIUM 9.2 05/23/2023   PROT 7.7 07/05/2022   ALBUMIN 4.8 07/05/2022   LABGLOB 2.9 07/05/2022   AGRATIO 1.7 07/05/2022   BILITOT 0.3 07/05/2022   ALKPHOS 77 07/05/2022   AST 21 07/05/2022   ALT 25 07/05/2022   ANIONGAP 11 09/20/2020   Last lipids Lab Results  Component Value Date   CHOL 228 (H) 05/23/2023   HDL 78 05/23/2023   LDLCALC 122 (H) 05/23/2023   TRIG 162 (H) 05/23/2023   CHOLHDL 2.9 05/23/2023   Last hemoglobin A1c Lab Results  Component Value Date   HGBA1C 7.6 (A) 04/14/2023      The 10-year ASCVD risk score (Arnett DK, et  al., 2019) is: 14.1%    Assessment & Plan:   Problem List Items Addressed This Visit       Nervous and Auditory   Carpal tunnel syndrome   Patient's Symptoms have persisted despite wearing a left wrist splint for 2 months. She would like to see a hand surgeon to discuss additional options. -referral ordered       Relevant Orders   Ambulatory referral to Hand Surgery     Other   Constipation   Patient's constipation has resolved since using MiraLAX.  We reviewed the proper use of MiraLAX including mixing MiraLAX with water.  Patient was asked to use MiraLAX daily for regular bowel movements and was instructed that she is able to increase to twice a day if needed.  Patient is aware that she may increase/decrease the dose of MiraLAX as needed for a regular bowel movement.      Right Back pain, CVA tenderness   Patient presents with new acute  lumbothoracic back pain on the right side that started 2 days ago.  She said that she woke up with the pain and it has since persisted.  She does have back pain at baseline, but it is usually in the lumbar region on the lumbothoracic region.  She has tried taking 800 mg of ibuprofen, which this does not help.  She denies relief with rest.  She denies fever, chills, hematuria, new shoes, new mattress.  In the past when she has had back pain, ibuprofen generally works for her.  I do suspect that this is MSK in origin; however, with the CVA tenderness on physical exam, will order a UA to evaluate for crystals/hematuria.  If crystals/hematuria present, will order CT scan to evaluate for renal stone. Plan: -Provided patient 1 week supply of naproxen 500 mg twice daily with alternating Tylenol -Patient was instructed to not use ibuprofen while taking naproxen -Patient reported that she will continue gentle stretches of her back -Patient was provided return precautions -U/A ordered to assess for signs of renal stone        Relevant Medications   naproxen (NAPROSYN) 500 MG tablet   Other Visit Diagnoses       Costovertebral angle tenderness    -  Primary   Relevant Orders   POCT Urinalysis Dipstick (91478) (Completed)   Urinalysis, Reflex Microscopic       Return if symptoms worsen or fail to improve, has appointment next month.    Faith Rogue, DO

## 2023-06-16 NOTE — Assessment & Plan Note (Signed)
 Patient's constipation has resolved since using MiraLAX.  We reviewed the proper use of MiraLAX including mixing MiraLAX with water.  Patient was asked to use MiraLAX daily for regular bowel movements and was instructed that she is able to increase to twice a day if needed.  Patient is aware that she may increase/decrease the dose of MiraLAX as needed for a regular bowel movement.

## 2023-06-17 ENCOUNTER — Encounter: Payer: Self-pay | Admitting: Student

## 2023-06-17 LAB — URINALYSIS, ROUTINE W REFLEX MICROSCOPIC
Bilirubin, UA: NEGATIVE
Ketones, UA: NEGATIVE
Leukocytes,UA: NEGATIVE
Nitrite, UA: NEGATIVE
Protein,UA: NEGATIVE
RBC, UA: NEGATIVE
Specific Gravity, UA: 1.016 (ref 1.005–1.030)
Urobilinogen, Ur: 0.2 mg/dL (ref 0.2–1.0)
pH, UA: 5 (ref 5.0–7.5)

## 2023-06-18 IMAGING — DX DG RIBS W/ CHEST 3+V*R*
5 series · 5 of 5 positions shown · non-contrast
Comparison: September 13, 2018.

CLINICAL DATA: Right upper back pain.

EXAM:
RIGHT RIBS AND CHEST - 3+ VIEW

[chest pa]
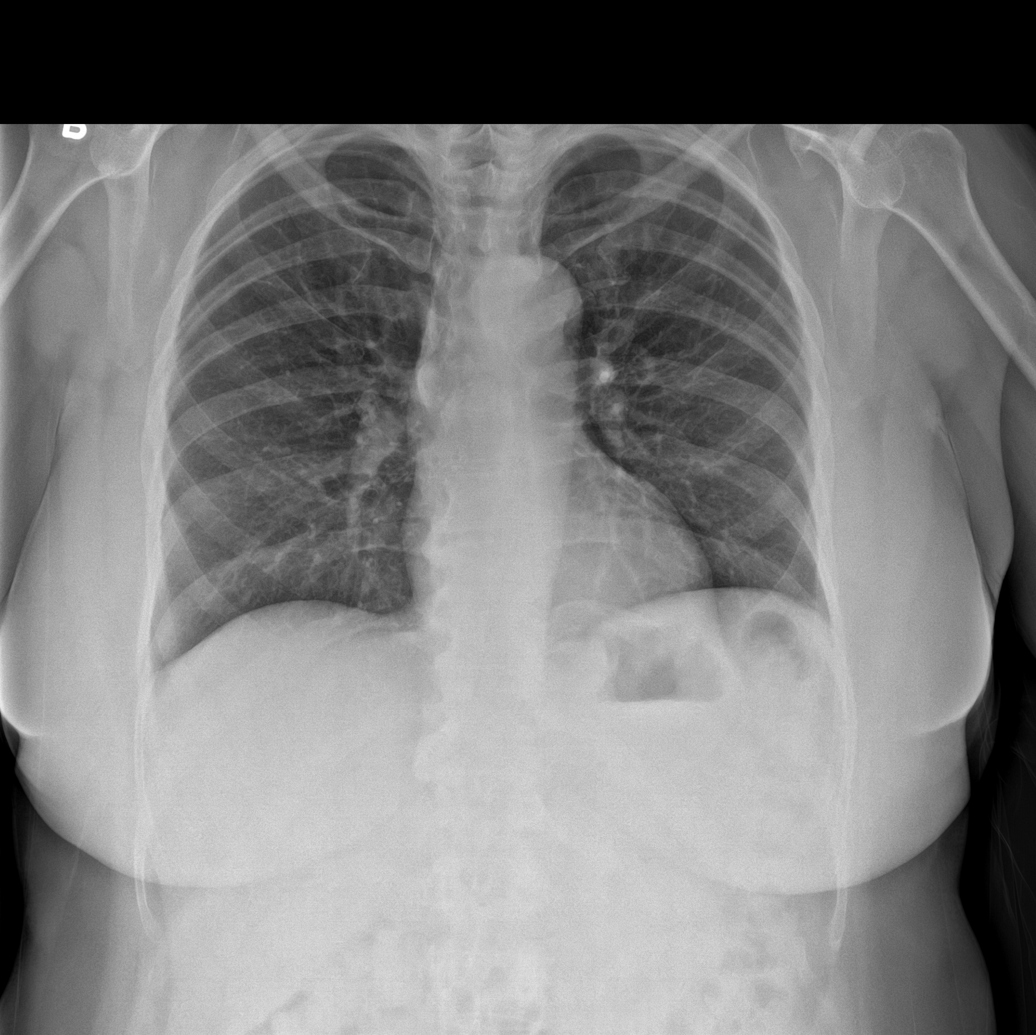

[rib ap (1 of 2)]
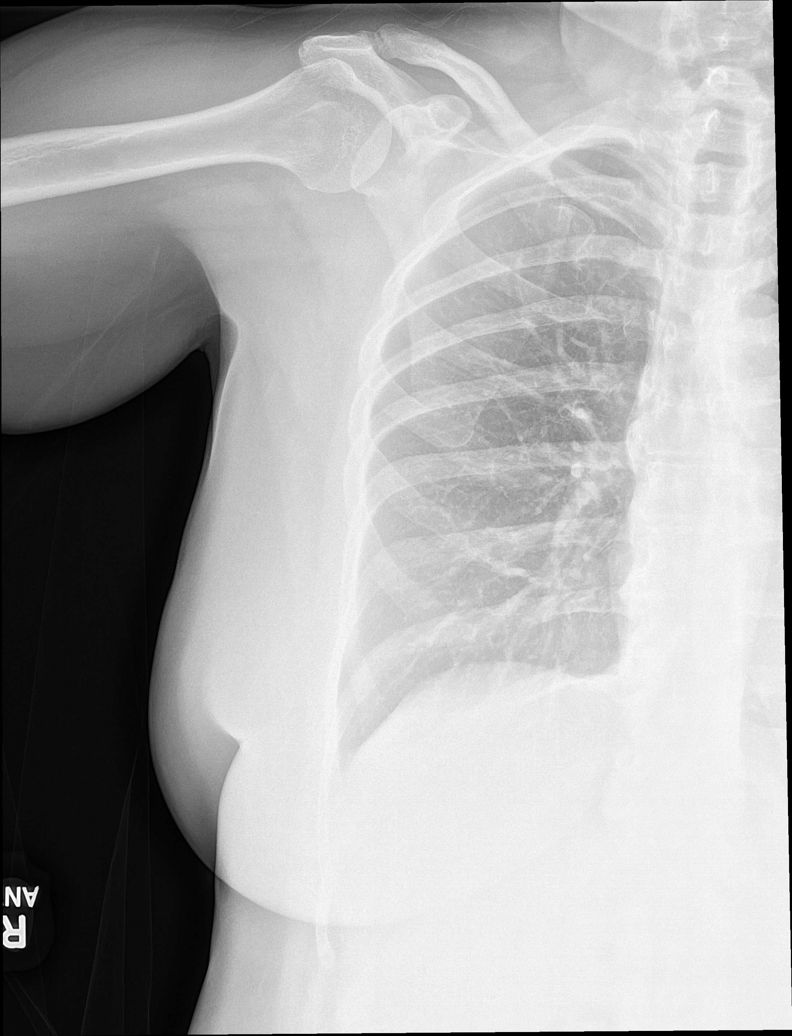

[rib ap (2 of 2)]
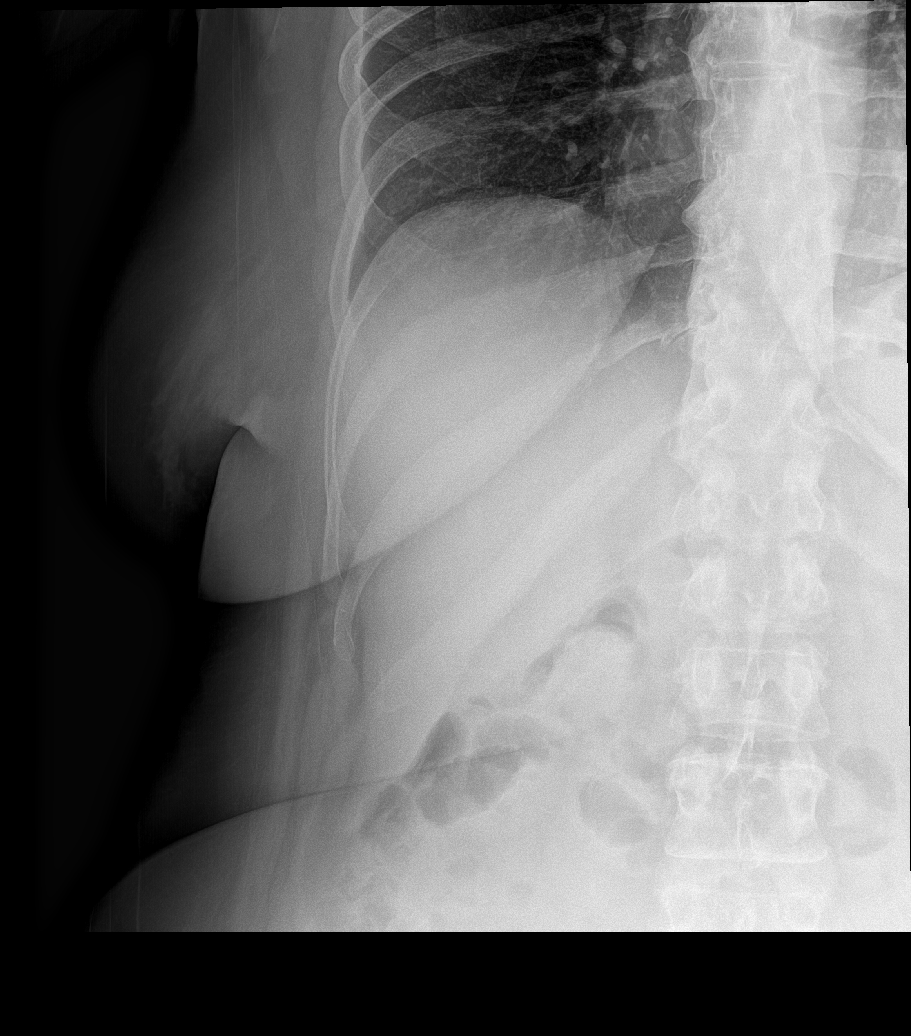

[rib obl (1 of 2)]
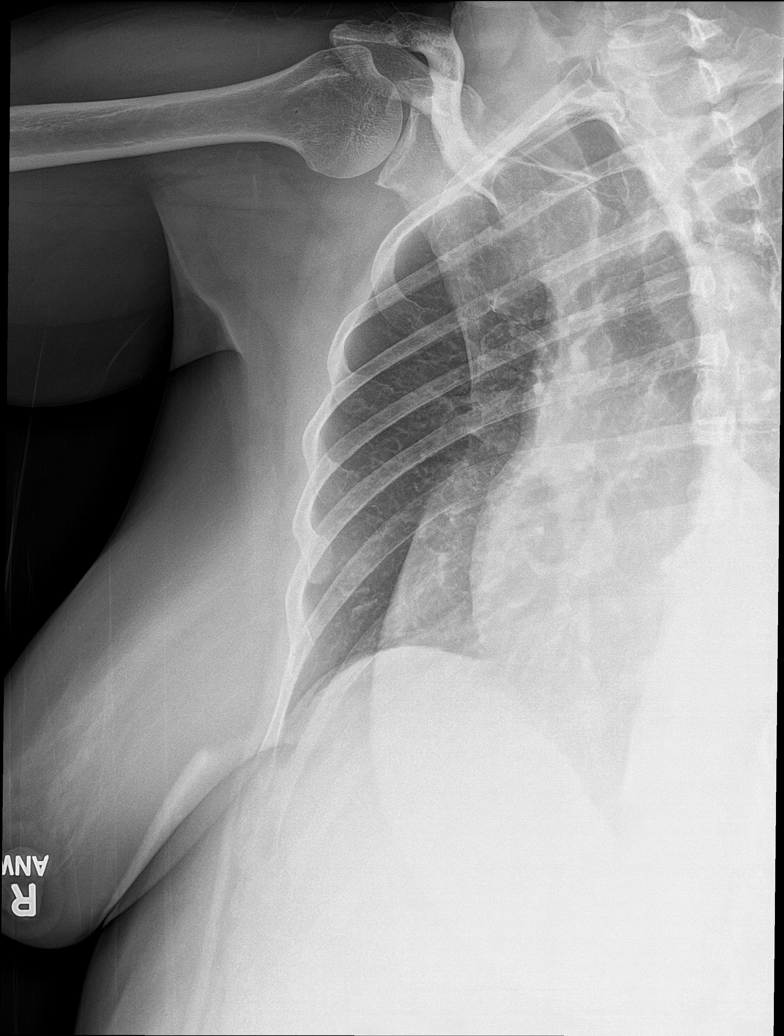

[rib obl (2 of 2)]
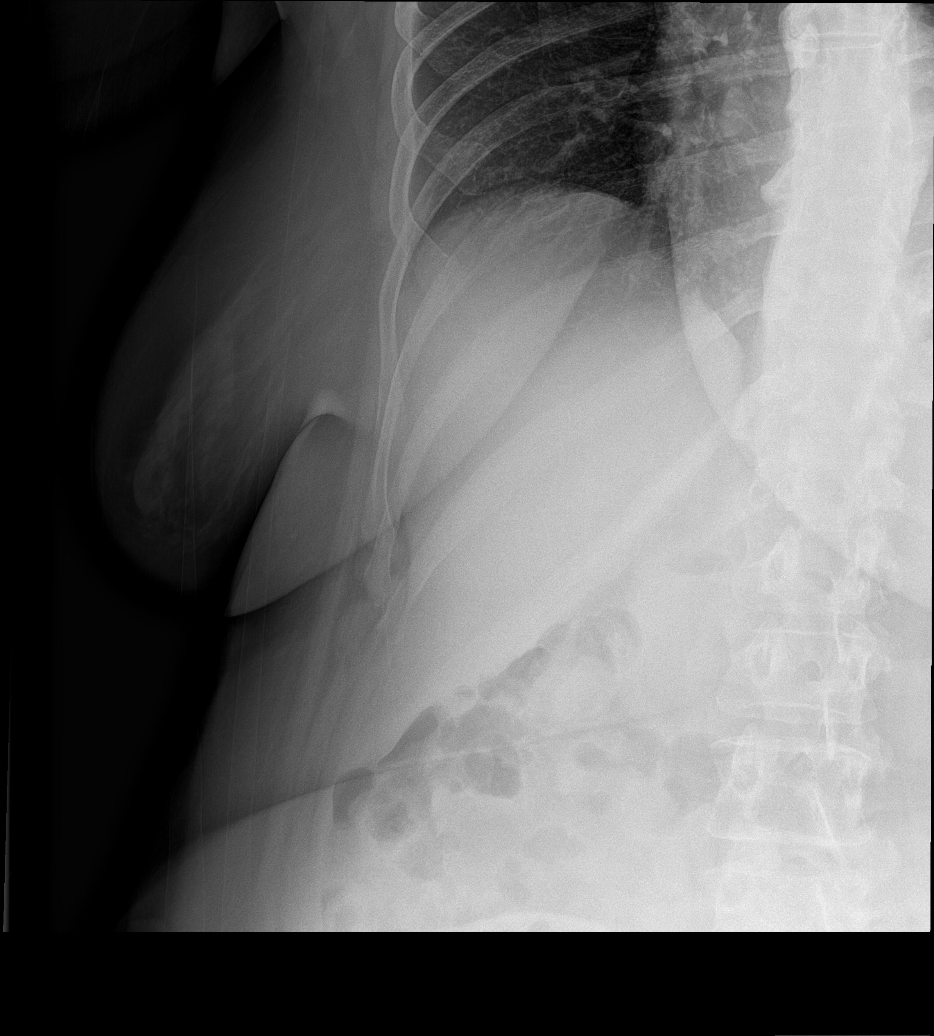

[5 of 5 positions shown; findings below may reference images not displayed]

FINDINGS: No fracture or other bone lesions are seen involving the ribs. There
is no evidence of pneumothorax or pleural effusion. Both lungs are
clear. Heart size and mediastinal contours are within normal limits.
IMPRESSION: Negative.

## 2023-06-19 NOTE — Progress Notes (Signed)
 Internal Medicine Clinic Attending  Case discussed with the resident at the time of the visit.  We reviewed the resident's history and exam and pertinent patient test results.  I agree with the assessment, diagnosis, and plan of care documented in the resident's note.

## 2023-06-20 ENCOUNTER — Other Ambulatory Visit (HOSPITAL_COMMUNITY): Payer: Self-pay

## 2023-06-20 ENCOUNTER — Encounter: Admitting: Student

## 2023-06-20 ENCOUNTER — Ambulatory Visit: Payer: Self-pay | Admitting: Student

## 2023-06-20 NOTE — Telephone Encounter (Signed)
 Copied from CRM [id number]. Topic: Clinical - Medication Question >> Jun 20, 2023 12:41 PM Karole Pacer C wrote: Reason for CRM: Patient is asking if she can get back on her traMADol (ULTRAM) 50 MG tablet. The patient states empagliflozin (JARDIANCE) 10 MG TABS tablet is too expensive and she can't afford to continue to pay $25.00 for the medication.

## 2023-06-21 ENCOUNTER — Telehealth: Payer: Self-pay | Admitting: Student

## 2023-06-21 NOTE — Telephone Encounter (Signed)
 Opened in error

## 2023-06-21 NOTE — Telephone Encounter (Signed)
 Called pt about scheduling an appt to discuss Tramadol. Pt stated she does not need Tramadol. She had called about Jardiance being too expensive,$25.00 with insurance. Then she stated she continues to have back pain, bloating and irregular BM's. Call transferred to front office to schedule an appt pt has an appt tomorrow 4/16.

## 2023-06-22 ENCOUNTER — Encounter: Admitting: Student

## 2023-07-04 ENCOUNTER — Other Ambulatory Visit (HOSPITAL_COMMUNITY): Payer: Self-pay

## 2023-07-04 ENCOUNTER — Ambulatory Visit
Admission: RE | Admit: 2023-07-04 | Discharge: 2023-07-04 | Disposition: A | Discharge status: Home / Self Care | Source: Ambulatory Visit | Attending: Internal Medicine | Admitting: Internal Medicine

## 2023-07-04 DIAGNOSIS — Z1231 Encounter for screening mammogram for malignant neoplasm of breast: Secondary | ICD-10-CM

## 2023-07-12 ENCOUNTER — Ambulatory Visit: Payer: Self-pay | Admitting: Orthopaedic Surgery

## 2023-07-12 ENCOUNTER — Other Ambulatory Visit: Payer: Self-pay

## 2023-07-12 DIAGNOSIS — G5602 Carpal tunnel syndrome, left upper limb: Secondary | ICD-10-CM

## 2023-07-12 DIAGNOSIS — G5601 Carpal tunnel syndrome, right upper limb: Secondary | ICD-10-CM

## 2023-07-12 NOTE — Progress Notes (Signed)
 Office Visit Note   Patient: [name]           Date of Birth: October 20, 1960           MRN: [id number] Visit Date: 07/12/2023              Requested by: Jeffie Mingle, MD [address], [address] [place],  [place] [address] PCP: Jonelle Neri, DO   Assessment & Plan: Visit Diagnoses:  1. Left carpal tunnel syndrome   2. Right carpal tunnel syndrome     Plan: History of Present Illness [name] is a 63 year old female with carpal tunnel syndrome who presents with worsening symptoms in her left hand.  She experiences numbness in the thumb, index, and middle fingers of her left hand, persisting throughout the day, with aching in the wrist at night. She often shakes her hand to relieve discomfort. A brace worn for two weeks did not alleviate symptoms and increased pain. Numbness does not affect the ring and small fingers. Nerve conduction studies in 2018 showed mild carpal tunnel syndrome, primarily on the right side, but her left hand is now more affected. She works at [employer] in [place], [employer].  Physical Exam CARDIOVASCULAR: Circulation and capillary refill normal. MUSCULOSKELETAL: No muscle atrophy in hands. Thumb abduction normal. Pulmonary abduction normal. Positive Durkin's sign in left hand. NEUROLOGICAL: Numbness in left thumb, index, and middle fingers.  Results DIAGNOSTIC Nerve conduction studies: Mild carpal tunnel syndrome, worse on the right side (2018)  Assessment and Plan Bilateral carpal tunnel syndrome worse on the left. Chronic left hand carpal tunnel syndrome with nocturnal pain and numbness. Previous studies showed mild severity, now more severe on the left. Bracing ineffective. - Order updated nerve conduction studies for both hands. - Schedule follow-up post-studies to discuss results and potential surgery.  Follow-Up Instructions: No follow-ups on file.   Orders:  No orders of the defined types were placed in this  encounter.  No orders of the defined types were placed in this encounter.   Subjective: Chief Complaint  Patient presents with   Left Wrist - Pain, Numbness    HPI  Review of Systems  Constitutional: Negative.   HENT: Negative.    Eyes: Negative.   Respiratory: Negative.    Cardiovascular: Negative.   Endocrine: Negative.   Musculoskeletal: Negative.   Neurological: Negative.   Hematological: Negative.   Psychiatric/Behavioral: Negative.    All other systems reviewed and are negative.    Objective: Vital Signs: There were no vitals taken for this visit.  Physical Exam Vitals and nursing note reviewed.  Constitutional:      Appearance: She is well-developed.  HENT:     Head: Atraumatic.     Nose: Nose normal.  Eyes:     Extraocular Movements: Extraocular movements intact.  Cardiovascular:     Pulses: Normal pulses.  Pulmonary:     Effort: Pulmonary effort is normal.  Abdominal:     Palpations: Abdomen is soft.  Musculoskeletal:     Cervical back: Neck supple.  Skin:    General: Skin is warm.     Capillary Refill: Capillary refill takes less than 2 seconds.  Neurological:     Mental Status: She is alert. Mental status is at baseline.  Psychiatric:        Behavior: Behavior normal.        Thought Content: Thought content normal.        Judgment: Judgment normal.  Ortho Exam  Specialty Comments:  No specialty comments available.  Imaging: No results found.   PMFS History: Patient Active Problem List   Diagnosis Date Noted   Right Back pain, CVA tenderness 06/16/2023   Flu vaccine refused 04/14/2023   Carpal tunnel syndrome 03/22/2023   Vitamin D  deficiency 09/14/2022   Healthcare maintenance 07/08/2022   RUQ abdominal pain 07/08/2022   Abdominal mass, left upper quadrant 07/08/2022   Osteoarthritis of ankle and foot 05/03/2022   Type 2 diabetes mellitus with hyperglycemia, without long-term current use of insulin (HCC) 03/30/2021    Hyperlipidemia associated with type 2 diabetes mellitus (HCC) 03/30/2021   Rheumatoid factor positive 12/10/2016   Chronic arthralgias of knees and hips 12/10/2016   Allergic rhinitis 10/16/2009   Constipation 03/11/2009   Essential hypertension 02/19/2009   GERD 02/19/2009   Past Medical History:  Diagnosis Date   Acute non-recurrent maxillary sinusitis 03/30/2021   Bilateral carpal tunnel syndrome    DIVERTICULAR DISEASE 11/26/2009   Annotation: noted in barium enema  Qualifier: Diagnosis of   By: Gaylyn Keas FNP, Davene Ernst      Replacing diagnoses that were inactivated after the 06/07/22 regulatory import   Hypercholesteremia    Hypertension    MENOPAUSE, SURGICAL 03/08/2004   Qualifier: Diagnosis of   By: Gaylyn Keas FNP, Nykedtra       OAB (overactive bladder)    Rheumatoid arthritis (HCC)     Family History  Problem Relation Age of Onset   Hypertension Mother    Diabetes Mother    Cancer Father    Diabetes Brother    Colon cancer Neg Hx    Rectal cancer Neg Hx    Stomach cancer Neg Hx     Past Surgical History:  Procedure Laterality Date   ABDOMINAL HYSTERECTOMY     ANKLE FRACTURE SURGERY Left    Social History   Occupational History   Not on file  Tobacco Use   Smoking status: Never   Smokeless tobacco: Never  Vaping Use   Vaping status: Never Used  Substance and Sexual Activity   Alcohol use: Yes    Alcohol/week: 3.0 standard drinks of alcohol    Types: 3 Cans of beer per week   Drug use: Yes    Frequency: 2.0 times per week    Types: Marijuana   Sexual activity: Not Currently    Birth control/protection: Surgical

## 2023-07-25 ENCOUNTER — Encounter: Admitting: Student

## 2023-07-25 NOTE — Progress Notes (Deleted)
   CC: ***  HPI:  [name] is a 63 y.o. female with a past medical history of hyperlipidemia, type 2 diabetes mellitus, hypertension who presents for follow-up appointment.  Occasions: Diabetes: Jardiance  10 mg daily, metformin  1000 mg daily Hyperlipidemia: Crestor  20 mg daily GERD: Protonix  40 mg daily Hypertension: Losartan -HCTZ 50-12.5 mg daily  Foot exam ACR Ophthalmology exam  Last A1c 3 months ago was 7.6.   Past Medical History:  Diagnosis Date   Acute non-recurrent maxillary sinusitis 03/30/2021   Bilateral carpal tunnel syndrome    DIVERTICULAR DISEASE 11/26/2009   Annotation: noted in barium enema  Qualifier: Diagnosis of   By: Gaylyn Keas FNP, Davene Ernst      Replacing diagnoses that were inactivated after the 06/07/22 regulatory import   Hypercholesteremia    Hypertension    MENOPAUSE, SURGICAL 03/08/2004   Qualifier: Diagnosis of   By: Gaylyn Keas FNP, Nykedtra       OAB (overactive bladder)    Rheumatoid arthritis (HCC)      Current Outpatient Medications:    empagliflozin  (JARDIANCE ) 10 MG TABS tablet, Take 1 tablet (10 mg total) by mouth daily before breakfast., Disp: 90 tablet, Rfl: 3   losartan -hydrochlorothiazide  (HYZAAR ) 50-12.5 MG tablet, Take 1 tablet by mouth daily., Disp: 90 tablet, Rfl: 3   metFORMIN  (GLUCOPHAGE ) 500 MG tablet, Take 2 tablets (1,000 mg total) by mouth daily with breakfast., Disp: 120 tablet, Rfl: 5   naproxen  (NAPROSYN ) 500 MG tablet, Take 1 tablet (500 mg total) by mouth 2 (two) times daily with a meal., Disp: 14 tablet, Rfl: 0   pantoprazole  (PROTONIX ) 40 MG tablet, Take 1 tablet (40 mg total) by mouth daily as needed., Disp: 30 tablet, Rfl: 2   rosuvastatin  (CRESTOR ) 20 MG tablet, Take 1 tablet (20 mg total) by mouth daily., Disp: 90 tablet, Rfl: 3  Review of Systems:  ***  Constitutional: Eye: Respiratory: Cardiovascular: GI: MSK: GU: Skin: Neuro: Endocrine:   Physical Exam:  There were no vitals filed for this  visit. *** General: Patient is sitting comfortably in the room  Eyes: Pupils equal and reactive to light, EOM intact  Head: Normocephalic, atraumatic  Neck: Supple, nontender, full range of motion, No JVD Cardio: Regular rate and rhythm, no murmurs, rubs or gallops. 2+ pulses to bilateral upper and lower extremities  Chest: No chest tenderness Pulmonary: Clear to ausculation bilaterally with no rales, rhonchi, and crackles  Abdomen: Soft, nontender with normoactive bowel sounds with no rebound or guarding  Neuro: Alert and orientated x3. CN II-XII intact. Sensation intact to upper and lower extremities. 2+ patellar reflex.  Back: No midline tenderness, no step off or deformities noted. No paraspinal muscle tenderness.  Skin: No rashes noted  MSK: 5/5 strength to upper and lower extremities.    Assessment & Plan:   No problem-specific Assessment & Plan notes found for this encounter.    Patient {GC/GE:3044014::"discussed with","seen with"} Dr. {NAMES:[id number]::"Guilloud","Hoffman","Mullen","Narendra","Williams","Vincent"}  Jonelle Neri, DO PGY-2 Internal Medicine Resident

## 2023-07-26 ENCOUNTER — Ambulatory Visit (INDEPENDENT_AMBULATORY_CARE_PROVIDER_SITE_OTHER): Payer: Self-pay | Admitting: Physical Medicine and Rehabilitation

## 2023-07-26 ENCOUNTER — Encounter: Payer: Self-pay | Admitting: Physical Medicine and Rehabilitation

## 2023-07-26 DIAGNOSIS — R202 Paresthesia of skin: Secondary | ICD-10-CM | POA: Diagnosis not present

## 2023-07-26 NOTE — Progress Notes (Unsigned)
 [name] - 63 y.o. female MRN [id number]  Date of birth: 1960/06/19  Office Visit Note: Visit Date: 07/26/2023 PCP: Jonelle Neri, DO Referred by: Wes Hamman, MD  Subjective: No chief complaint on file.  HPI: [name] is a 63 y.o. female who comes in today at the request of Dr. Claria Crofts for evaluation and management of chronic, worsening and severe pain, numbness and tingling in the Bilateral upper extremities.  Patient is Right hand dominant.  She experiences numbness in the thumb, index, and middle fingers of her left hand, persisting throughout the day, with aching in the wrist at night. She often shakes her hand to relieve discomfort. A brace worn for two weeks did not alleviate symptoms and increased pain. Numbness does not affect the ring and small fingers. Nerve conduction studies in 2018 showed mild carpal tunnel syndrome, primarily on the right side, but her left hand is now more affected. She works at [employer] in [place], [employer].         ROS Otherwise per HPI.  Assessment & Plan: Visit Diagnoses:    ICD-10-CM   1. Paresthesia of skin  R20.2 NCV with EMG (electromyography)       Plan: No additional findings.   Meds & Orders: No orders of the defined types were placed in this encounter.   Orders Placed This Encounter  Procedures   NCV with EMG (electromyography)    Follow-up: No follow-ups on file.   Procedures: No procedures performed      Clinical History: No specialty comments available.   She reports that she has never smoked. She has never used smokeless tobacco.  Recent Labs    04/14/23 1501  HGBA1C 7.6*    Objective:  VS:  HT:    WT:   BMI:     BP:   HR: bpm  TEMP: ( )  RESP:  Physical Exam  Ortho Exam  Imaging: No results found.  Past Medical/Family/Surgical/Social History: Medications & Allergies reviewed per EMR, new medications updated. Patient Active Problem List   Diagnosis Date Noted   Right  Back pain, CVA tenderness 06/16/2023   Flu vaccine refused 04/14/2023   Carpal tunnel syndrome 03/22/2023   Vitamin D  deficiency 09/14/2022   Healthcare maintenance 07/08/2022   RUQ abdominal pain 07/08/2022   Abdominal mass, left upper quadrant 07/08/2022   Osteoarthritis of ankle and foot 05/03/2022   Type 2 diabetes mellitus with hyperglycemia, without long-term current use of insulin (HCC) 03/30/2021   Hyperlipidemia associated with type 2 diabetes mellitus (HCC) 03/30/2021   Rheumatoid factor positive 12/10/2016   Chronic arthralgias of knees and hips 12/10/2016   Allergic rhinitis 10/16/2009   Constipation 03/11/2009   Essential hypertension 02/19/2009   GERD 02/19/2009   Past Medical History:  Diagnosis Date   Acute non-recurrent maxillary sinusitis 03/30/2021   Bilateral carpal tunnel syndrome    DIVERTICULAR DISEASE 11/26/2009   Annotation: noted in barium enema  Qualifier: Diagnosis of   By: Gaylyn Keas FNP, Davene Ernst      Replacing diagnoses that were inactivated after the 06/07/22 regulatory import   Hypercholesteremia    Hypertension    MENOPAUSE, SURGICAL 03/08/2004   Qualifier: Diagnosis of   By: Gaylyn Keas FNP, Nykedtra       OAB (overactive bladder)    Rheumatoid arthritis (HCC)    Family History  Problem Relation Age of Onset   Hypertension Mother    Diabetes Mother    Cancer Father  Diabetes Brother    Colon cancer Neg Hx    Rectal cancer Neg Hx    Stomach cancer Neg Hx    Past Surgical History:  Procedure Laterality Date   ABDOMINAL HYSTERECTOMY     ANKLE FRACTURE SURGERY Left    Social History   Occupational History   Not on file  Tobacco Use   Smoking status: Never   Smokeless tobacco: Never  Vaping Use   Vaping status: Never Used  Substance and Sexual Activity   Alcohol use: Yes    Alcohol/week: 3.0 standard drinks of alcohol    Types: 3 Cans of beer per week   Drug use: Yes    Frequency: 2.0 times per week    Types: Marijuana   Sexual  activity: Not Currently    Birth control/protection: Surgical

## 2023-07-26 NOTE — Progress Notes (Unsigned)
 Pain Scale   Average Pain 7  Pain, tingling, numbness in both hands. At night it goes up into arms. Fingers stay numb. Difficultly grasping. Rt handed

## 2023-07-27 NOTE — Procedures (Unsigned)
 EMG & NCV Findings: Evaluation of the left median motor and the right median motor nerves showed prolonged distal onset latency (L7.0, R5.9 ms) and decreased conduction velocity (Elbow-Wrist, L42, R46 m/s).  The left ulnar motor nerve showed decreased conduction velocity (A Elbow-B Elbow, 50 m/s).  The right ulnar motor nerve showed decreased conduction velocity (B Elbow-Wrist, 49 m/s).  The left median (across palm) sensory and the right median (across palm) sensory nerves showed prolonged distal peak latency (Wrist, L12.2, R12.0 ms), reduced amplitude (L3.4, R4.3 V), and prolonged distal peak latency (Palm, L6.1, R4.6 ms).  The right ulnar sensory nerve showed prolonged distal peak latency (6.0 ms), reduced amplitude (11.9 V), and decreased conduction velocity (Wrist-5th Digit, 23 m/s).  All remaining nerves (as indicated in the following tables) were within normal limits.  Left vs. Right side comparison data for the median motor nerve indicates abnormal L-R latency difference (1.1 ms).  The ulnar sensory nerve indicates abnormal L-R latency difference (2.5 ms).  All remaining left vs. right side differences were within normal limits.    All examined muscles (as indicated in the following table) showed no evidence of electrical instability.    Impression: The above electrodiagnostic study is ABNORMAL and reveals evidence of a moderate to severe bilateral median nerve entrapment at the wrist (carpal tunnel syndrome) affecting sensory and motor components.   Recommendations: 1.  Follow-up with referring physician. 2.  Continue current management of symptoms. 3.  Suggest surgical evaluation.  ___________________________ [name] FAAPMR Board Certified, American Board of Physical Medicine and Rehabilitation    Nerve Conduction Studies Anti Sensory Summary Table   Stim Site NR Peak (ms) Norm Peak (ms) P-T Amp (V) Norm P-T Amp Site1 Site2 Delta-P (ms) Dist (cm) Vel (m/s) Norm Vel (m/s)  Left  Median Acr Palm Anti Sensory (2nd Digit)  31.1C  Wrist    *12.2 <3.6 *3.4 >10 Wrist Palm 6.1 0.0    Palm    *6.1 <2.0 3.6         Right Median Acr Palm Anti Sensory (2nd Digit)  30.4C  Wrist    *12.0 <3.6 *4.3 >10 Wrist Palm 7.4 0.0    Palm    *4.6 <2.0 13.1         Left Radial Anti Sensory (Base 1st Digit)  30.4C  Wrist    2.1 <3.1 15.0  Wrist Base 1st Digit 2.1 0.0    Right Radial Anti Sensory (Base 1st Digit)  29.6C  Wrist    2.1 <3.1 25.7  Wrist Base 1st Digit 2.1 0.0    Site 2    2.2  31.4         Left Ulnar Anti Sensory (5th Digit)  31.2C  Wrist    3.5 <3.7 16.2 >15.0 Wrist 5th Digit 3.5 14.0 40 >38  Right Ulnar Anti Sensory (5th Digit)  31C  Wrist    *6.0 <3.7 *11.9 >15.0 Wrist 5th Digit 6.0 14.0 *23 >38   Motor Summary Table   Stim Site NR Onset (ms) Norm Onset (ms) O-P Amp (mV) Norm O-P Amp Site1 Site2 Delta-0 (ms) Dist (cm) Vel (m/s) Norm Vel (m/s)  Left Median Motor (Abd Poll Brev)  30.9C  Wrist    *7.0 <4.2 6.0 >5 Elbow Wrist 5.8 24.5 *42 >50  Elbow    12.8  3.5         Right Median Motor (Abd Poll Brev)  30C  Wrist    *5.9 <4.2 5.1 >5 Elbow Wrist 5.0 23.0 *  46 >50  Elbow    10.9  0.7         Left Ulnar Motor (Abd Dig Min)  31C  Wrist    3.0 <4.2 7.8 >3 B Elbow Wrist 4.3 23.0 53 >53  B Elbow    7.3  7.8  A Elbow B Elbow 2.0 10.0 *50 >53  A Elbow    9.3  7.8         Right Ulnar Motor (Abd Dig Min)  30.3C  Wrist    3.0 <4.2 7.0 >3 B Elbow Wrist 4.7 23.0 *49 >53  B Elbow    7.7  7.0  A Elbow B Elbow 1.7 10.0 59 >53  A Elbow    9.4  6.5          EMG   Side Muscle Nerve Root Ins Act Fibs Psw Amp Dur Poly Recrt Int Deatra Face Comment  Left Abd Poll Brev Median C8-T1 Nml Nml Nml Nml Nml 0 Nml Nml   Left 1stDorInt Ulnar C8-T1 Nml Nml Nml Nml Nml 0 Nml Nml   Left PronatorTeres Median C6-7 Nml Nml Nml Nml Nml 0 Nml Nml   Left Biceps Musculocut C5-6 Nml Nml Nml Nml Nml 0 Nml Nml   Left Deltoid Axillary C5-6 Nml Nml Nml Nml Nml 0 Nml Nml     Nerve Conduction  Studies Anti Sensory Left/Right Comparison   Stim Site L Lat (ms) R Lat (ms) L-R Lat (ms) L Amp (V) R Amp (V) L-R Amp (%) Site1 Site2 L Vel (m/s) R Vel (m/s) L-R Vel (m/s)  Median Acr Palm Anti Sensory (2nd Digit)  31.1C  Wrist *12.2 *12.0 0.2 *3.4 *4.3 20.9 Wrist Palm     Palm *6.1 *4.6 1.5 3.6 13.1 72.5       Radial Anti Sensory (Base 1st Digit)  30.4C  Wrist 2.1 2.1 0.0 15.0 25.7 41.6 Wrist Base 1st Digit     Ulnar Anti Sensory (5th Digit)  31.2C  Wrist 3.5 *6.0 *2.5 16.2 *11.9 26.5 Wrist 5th Digit 40 *23 17   Motor Left/Right Comparison   Stim Site L Lat (ms) R Lat (ms) L-R Lat (ms) L Amp (mV) R Amp (mV) L-R Amp (%) Site1 Site2 L Vel (m/s) R Vel (m/s) L-R Vel (m/s)  Median Motor (Abd Poll Brev)  30.9C  Wrist *7.0 *5.9 *1.1 6.0 5.1 15.0 Elbow Wrist *42 *46 4  Elbow 12.8 10.9 1.9 3.5 0.7 80.0       Ulnar Motor (Abd Dig Min)  31C  Wrist 3.0 3.0 0.0 7.8 7.0 10.3 B Elbow Wrist 53 *49 4  B Elbow 7.3 7.7 0.4 7.8 7.0 10.3 A Elbow B Elbow *50 59 9  A Elbow 9.3 9.4 0.1 7.8 6.5 16.7          Waveforms:

## 2023-08-02 ENCOUNTER — Other Ambulatory Visit (HOSPITAL_COMMUNITY): Payer: Self-pay

## 2023-08-03 ENCOUNTER — Other Ambulatory Visit (HOSPITAL_COMMUNITY): Payer: Self-pay

## 2023-08-03 ENCOUNTER — Encounter: Payer: Self-pay | Admitting: Physical Medicine and Rehabilitation

## 2023-08-09 ENCOUNTER — Ambulatory Visit: Admitting: Orthopaedic Surgery

## 2023-08-09 DIAGNOSIS — G5601 Carpal tunnel syndrome, right upper limb: Secondary | ICD-10-CM

## 2023-08-09 DIAGNOSIS — G5602 Carpal tunnel syndrome, left upper limb: Secondary | ICD-10-CM | POA: Diagnosis not present

## 2023-08-09 NOTE — Progress Notes (Signed)
 Office Visit Note   Patient: [name]           Date of Birth: Oct 22, 1960           MRN: [id number] Visit Date: 08/09/2023              Requested by: Jonelle Neri, DO [address], [address] [place],  [place] [address] PCP: Jonelle Neri, DO   Assessment & Plan: Visit Diagnoses:  1. Left carpal tunnel syndrome   2. Right carpal tunnel syndrome     Plan: History of Present Illness [name] is a 63 year old female who presents for follow-up on nerve studies.  She experiences more pain in her left hand compared to her right, with significant discomfort, especially at night. The pain is severe and impacts her ability to work, as she uses her hands extensively in her occupation.  She also has an inability to lift her arm, because of her shoulder.  Results LABS HbA1c: 7.6% (04/11/2023)  DIAGNOSTIC Nerve conduction study: Moderate to severe carpal tunnel syndrome in both hands  Assessment and Plan Carpal tunnel syndrome, bilateral Moderate to severe bilateral carpal tunnel syndrome, left hand more symptomatic. She elected for surgery.  Detailed surgical plan discussed including risks, benefits, prognosis.  Simultaneous surgery not advised due to recovery limitations. - Initiate authorization for left hand carpal tunnel release surgery. - Schedule surgery within weeks. - Provide work note for absence due to hand pain.  Type 2 diabetes mellitus without complications Diabetes management crucial for surgical outcomes. Recent A1c 7.6, indicating reasonable control. - Ensure diabetes well-controlled prior to surgery.  Follow-Up Instructions: No follow-ups on file.   Orders:  No orders of the defined types were placed in this encounter.  No orders of the defined types were placed in this encounter.     Subjective: Chief Complaint  Patient presents with   Right Wrist - Follow-up    EMG review   Left Wrist - Follow-up    HPI  Review of Systems   Constitutional: Negative.   HENT: Negative.    Eyes: Negative.   Respiratory: Negative.    Cardiovascular: Negative.   Endocrine: Negative.   Musculoskeletal: Negative.   Neurological: Negative.   Hematological: Negative.   Psychiatric/Behavioral: Negative.    All other systems reviewed and are negative.    Objective: Vital Signs: There were no vitals taken for this visit.  Physical Exam Vitals and nursing note reviewed.  Constitutional:      Appearance: She is well-developed.  HENT:     Head: Normocephalic and atraumatic.  Pulmonary:     Effort: Pulmonary effort is normal.  Abdominal:     Palpations: Abdomen is soft.  Musculoskeletal:     Cervical back: Neck supple.  Skin:    General: Skin is warm.     Capillary Refill: Capillary refill takes less than 2 seconds.  Neurological:     Mental Status: She is alert and oriented to person, place, and time.  Psychiatric:        Behavior: Behavior normal.        Thought Content: Thought content normal.        Judgment: Judgment normal.     Ortho Exam  Specialty Comments:  12/09/16 EMG/NCS IMPRESSION:    Abnormal study demonstrate: - Mild bilateral median neuropathies at the wrist consistent with mild bilateral carpal tunnel syndrome. This is slightly more affected on the right side than left side.  INTERPRETING PHYSICIAN:  Omega Bible, MD  Imaging: No results found.   PMFS History: Patient Active Problem List   Diagnosis Date Noted   Right Back pain, CVA tenderness 06/16/2023   Flu vaccine refused 04/14/2023   Carpal tunnel syndrome 03/22/2023   Vitamin D  deficiency 09/14/2022   Healthcare maintenance 07/08/2022   RUQ abdominal pain 07/08/2022   Abdominal mass, left upper quadrant 07/08/2022   Osteoarthritis of ankle and foot 05/03/2022   Type 2 diabetes mellitus with hyperglycemia, without long-term current use of insulin (HCC) 03/30/2021   Hyperlipidemia associated with type 2 diabetes  mellitus (HCC) 03/30/2021   Rheumatoid factor positive 12/10/2016   Chronic arthralgias of knees and hips 12/10/2016   Allergic rhinitis 10/16/2009   Constipation 03/11/2009   Essential hypertension 02/19/2009   GERD 02/19/2009   Past Medical History:  Diagnosis Date   Acute non-recurrent maxillary sinusitis 03/30/2021   Bilateral carpal tunnel syndrome    Diabetes (HCC)    DIVERTICULAR DISEASE 11/26/2009   Annotation: noted in barium enema  Qualifier: Diagnosis of   By: Gaylyn Keas FNP, Davene Ernst      Replacing diagnoses that were inactivated after the 06/07/22 regulatory import   Hypercholesteremia    Hypertension    MENOPAUSE, SURGICAL 03/08/2004   Qualifier: Diagnosis of   By: Gaylyn Keas FNP, Nykedtra       OAB (overactive bladder)    Rheumatoid arthritis (HCC)     Family History  Problem Relation Age of Onset   Hypertension Mother    Diabetes Mother    Cancer Father    Diabetes Brother    Colon cancer Neg Hx    Rectal cancer Neg Hx    Stomach cancer Neg Hx     Past Surgical History:  Procedure Laterality Date   ABDOMINAL HYSTERECTOMY     ANKLE FRACTURE SURGERY Left    Social History   Occupational History   Not on file  Tobacco Use   Smoking status: Never   Smokeless tobacco: Never  Vaping Use   Vaping status: Never Used  Substance and Sexual Activity   Alcohol use: Yes    Alcohol/week: 3.0 standard drinks of alcohol    Types: 3 Cans of beer per week   Drug use: Yes    Frequency: 2.0 times per week    Types: Marijuana   Sexual activity: Not Currently    Birth control/protection: Surgical

## 2023-08-10 ENCOUNTER — Encounter (HOSPITAL_BASED_OUTPATIENT_CLINIC_OR_DEPARTMENT_OTHER): Payer: Self-pay | Admitting: Orthopaedic Surgery

## 2023-08-10 ENCOUNTER — Other Ambulatory Visit: Payer: Self-pay

## 2023-08-10 ENCOUNTER — Other Ambulatory Visit (HOSPITAL_COMMUNITY): Payer: Self-pay

## 2023-08-11 ENCOUNTER — Encounter (HOSPITAL_BASED_OUTPATIENT_CLINIC_OR_DEPARTMENT_OTHER)
Admission: RE | Admit: 2023-08-11 | Discharge: 2023-08-11 | Disposition: A | Discharge status: Home / Self Care | Source: Ambulatory Visit | Attending: Orthopaedic Surgery | Admitting: Orthopaedic Surgery

## 2023-08-11 ENCOUNTER — Other Ambulatory Visit: Payer: Self-pay

## 2023-08-11 DIAGNOSIS — E1165 Type 2 diabetes mellitus with hyperglycemia: Secondary | ICD-10-CM | POA: Diagnosis not present

## 2023-08-11 DIAGNOSIS — Z01818 Encounter for other preprocedural examination: Secondary | ICD-10-CM | POA: Insufficient documentation

## 2023-08-11 DIAGNOSIS — I10 Essential (primary) hypertension: Secondary | ICD-10-CM | POA: Insufficient documentation

## 2023-08-11 LAB — BASIC METABOLIC PANEL WITH GFR
Anion gap: 12 (ref 5–15)
BUN: 12 mg/dL (ref 8–23)
CO2: 21 mmol/L — ABNORMAL LOW (ref 22–32)
Calcium: 9.8 mg/dL (ref 8.9–10.3)
Chloride: 103 mmol/L (ref 98–111)
Creatinine, Ser: 0.69 mg/dL (ref 0.44–1.00)
GFR, Estimated: >60 mL/min (ref >60)
Glucose, Bld: 159 mg/dL — ABNORMAL HIGH (ref 70–99)
Potassium: 3.8 mmol/L (ref 3.5–5.1)
Sodium: 136 mmol/L (ref 135–145)

## 2023-08-11 NOTE — Progress Notes (Signed)
 Provided patient with presurgical drink and instructions. Patient verbalized understanding

## 2023-08-17 ENCOUNTER — Other Ambulatory Visit: Payer: Self-pay

## 2023-08-17 ENCOUNTER — Other Ambulatory Visit: Payer: Self-pay | Admitting: Physician Assistant

## 2023-08-17 ENCOUNTER — Encounter (HOSPITAL_BASED_OUTPATIENT_CLINIC_OR_DEPARTMENT_OTHER)
Admission: RE | Disposition: A | Discharge status: Home / Self Care | Payer: Self-pay | Source: Home / Self Care | Attending: Orthopaedic Surgery

## 2023-08-17 ENCOUNTER — Ambulatory Visit (HOSPITAL_BASED_OUTPATIENT_CLINIC_OR_DEPARTMENT_OTHER): Admitting: Anesthesiology

## 2023-08-17 ENCOUNTER — Ambulatory Visit (HOSPITAL_BASED_OUTPATIENT_CLINIC_OR_DEPARTMENT_OTHER)
Admission: RE | Admit: 2023-08-17 | Discharge: 2023-08-17 | Disposition: A | Discharge status: Home / Self Care | Attending: Orthopaedic Surgery | Admitting: Orthopaedic Surgery

## 2023-08-17 ENCOUNTER — Encounter (HOSPITAL_BASED_OUTPATIENT_CLINIC_OR_DEPARTMENT_OTHER): Payer: Self-pay | Admitting: Orthopaedic Surgery

## 2023-08-17 ENCOUNTER — Other Ambulatory Visit (HOSPITAL_COMMUNITY): Payer: Self-pay

## 2023-08-17 DIAGNOSIS — E119 Type 2 diabetes mellitus without complications: Secondary | ICD-10-CM | POA: Diagnosis not present

## 2023-08-17 DIAGNOSIS — Z79899 Other long term (current) drug therapy: Secondary | ICD-10-CM | POA: Insufficient documentation

## 2023-08-17 DIAGNOSIS — G5602 Carpal tunnel syndrome, left upper limb: Secondary | ICD-10-CM | POA: Diagnosis not present

## 2023-08-17 DIAGNOSIS — E785 Hyperlipidemia, unspecified: Secondary | ICD-10-CM

## 2023-08-17 DIAGNOSIS — K219 Gastro-esophageal reflux disease without esophagitis: Secondary | ICD-10-CM | POA: Insufficient documentation

## 2023-08-17 DIAGNOSIS — E1165 Type 2 diabetes mellitus with hyperglycemia: Secondary | ICD-10-CM

## 2023-08-17 DIAGNOSIS — I10 Essential (primary) hypertension: Secondary | ICD-10-CM | POA: Diagnosis not present

## 2023-08-17 HISTORY — DX: Gastro-esophageal reflux disease without esophagitis: K21.9

## 2023-08-17 HISTORY — DX: Constipation, unspecified: K59.00

## 2023-08-17 HISTORY — PX: CARPAL TUNNEL RELEASE: SHX101

## 2023-08-17 LAB — GLUCOSE, CAPILLARY
Glucose-Capillary: 113 mg/dL — ABNORMAL HIGH (ref 70–99)
Glucose-Capillary: 117 mg/dL — ABNORMAL HIGH (ref 70–99)

## 2023-08-17 SURGERY — CARPAL TUNNEL RELEASE
Anesthesia: Monitor Anesthesia Care | Site: Wrist | Laterality: Left

## 2023-08-17 MED ORDER — DEXMEDETOMIDINE HCL IN NACL 80 MCG/20ML IV SOLN
INTRAVENOUS | Status: AC
  Filled 2023-08-17: qty 20

## 2023-08-17 MED ORDER — MIDAZOLAM HCL 2 MG/2ML IJ SOLN
INTRAMUSCULAR | Status: AC
  Filled 2023-08-17: qty 2

## 2023-08-17 MED ORDER — MIDAZOLAM HCL 2 MG/2ML IJ SOLN
INTRAMUSCULAR | Status: DC | PRN
  Administered 2023-08-17: 2 mg via INTRAVENOUS

## 2023-08-17 MED ORDER — OXYCODONE HCL 5 MG/5ML PO SOLN: 5.0000 mg | Freq: Once | ORAL | Status: DC | PRN

## 2023-08-17 MED ORDER — ONDANSETRON HCL 4 MG PO TABS
4.0000 mg | ORAL_TABLET | Freq: Three times a day (TID) | ORAL | 0 refills | Status: DC | PRN
  Filled 2023-08-17: qty 15, 5d supply, fill #0

## 2023-08-17 MED ORDER — ACETAMINOPHEN 500 MG PO TABS
ORAL_TABLET | ORAL | Status: AC
  Filled 2023-08-17: qty 1

## 2023-08-17 MED ORDER — LIDOCAINE 2% (20 MG/ML) 5 ML SYRINGE
INTRAMUSCULAR | Status: AC
  Filled 2023-08-17: qty 15

## 2023-08-17 MED ORDER — DEXAMETHASONE SODIUM PHOSPHATE 10 MG/ML IJ SOLN
INTRAMUSCULAR | Status: AC
  Filled 2023-08-17: qty 3

## 2023-08-17 MED ORDER — LIDOCAINE-EPINEPHRINE (PF) 1 %-1:200000 IJ SOLN
INTRAMUSCULAR | Status: DC | PRN
  Administered 2023-08-17: 10 mL

## 2023-08-17 MED ORDER — PROPOFOL 10 MG/ML IV BOLUS
INTRAVENOUS | Status: DC | PRN
  Administered 2023-08-17 (×2): 10 mg via INTRAVENOUS
  Administered 2023-08-17: 20 mg via INTRAVENOUS

## 2023-08-17 MED ORDER — HYDROMORPHONE HCL 1 MG/ML IJ SOLN: 0.2500 mg | INTRAMUSCULAR | Status: DC | PRN

## 2023-08-17 MED ORDER — LACTATED RINGERS IV SOLN: INTRAVENOUS | Status: DC

## 2023-08-17 MED ORDER — HYDROCODONE-ACETAMINOPHEN 5-325 MG PO TABS
1.0000 | ORAL_TABLET | Freq: Three times a day (TID) | ORAL | 0 refills | Status: DC | PRN
  Filled 2023-08-17: qty 21, 7d supply, fill #0

## 2023-08-17 MED ORDER — ONDANSETRON HCL 4 MG/2ML IJ SOLN
INTRAMUSCULAR | Status: AC
  Filled 2023-08-17: qty 6

## 2023-08-17 MED ORDER — ONDANSETRON HCL 4 MG/2ML IJ SOLN
INTRAMUSCULAR | Status: DC | PRN
  Administered 2023-08-17: 4 mg via INTRAVENOUS

## 2023-08-17 MED ORDER — KETOROLAC TROMETHAMINE 30 MG/ML IJ SOLN
INTRAMUSCULAR | Status: DC | PRN
  Administered 2023-08-17: 15 mg via INTRAVENOUS

## 2023-08-17 MED ORDER — SODIUM CHLORIDE 0.9 % IV SOLN: 12.5000 mg | INTRAVENOUS | Status: DC | PRN

## 2023-08-17 MED ORDER — PROPOFOL 500 MG/50ML IV EMUL
INTRAVENOUS | Status: AC
  Filled 2023-08-17: qty 50

## 2023-08-17 MED ORDER — CEFAZOLIN SODIUM-DEXTROSE 2-4 GM/100ML-% IV SOLN
2.0000 g | INTRAVENOUS | Status: AC
Start: 2023-08-18 — End: 2023-08-17
  Administered 2023-08-17: 2 g via INTRAVENOUS

## 2023-08-17 MED ORDER — PROPOFOL 1000 MG/100ML IV EMUL
INTRAVENOUS | Status: AC
  Filled 2023-08-17: qty 100

## 2023-08-17 MED ORDER — 0.9 % SODIUM CHLORIDE (POUR BTL) OPTIME
TOPICAL | Status: DC | PRN
  Administered 2023-08-17: 1000 mL

## 2023-08-17 MED ORDER — DEXMEDETOMIDINE HCL IN NACL 80 MCG/20ML IV SOLN
INTRAVENOUS | Status: DC | PRN
  Administered 2023-08-17 (×3): 4 ug via INTRAVENOUS

## 2023-08-17 MED ORDER — LIDOCAINE HCL (CARDIAC) PF 100 MG/5ML IV SOSY
PREFILLED_SYRINGE | INTRAVENOUS | Status: DC | PRN
  Administered 2023-08-17: 60 mg via INTRAVENOUS
  Administered 2023-08-17: 40 mg via INTRAVENOUS

## 2023-08-17 MED ORDER — FENTANYL CITRATE (PF) 100 MCG/2ML IJ SOLN
INTRAMUSCULAR | Status: AC
Start: 2023-08-17 — End: 2023-08-17
  Filled 2023-08-17: qty 2

## 2023-08-17 MED ORDER — CEFAZOLIN SODIUM-DEXTROSE 2-4 GM/100ML-% IV SOLN
INTRAVENOUS | Status: AC
  Filled 2023-08-17: qty 100

## 2023-08-17 MED ORDER — PROPOFOL 500 MG/50ML IV EMUL
INTRAVENOUS | Status: DC | PRN
  Administered 2023-08-17: 75 ug/kg/min via INTRAVENOUS

## 2023-08-17 MED ORDER — LIDOCAINE-EPINEPHRINE (PF) 1 %-1:200000 IJ SOLN
INTRAMUSCULAR | Status: AC
  Filled 2023-08-17: qty 30

## 2023-08-17 MED ORDER — OXYCODONE HCL 5 MG PO TABS: 5.0000 mg | ORAL_TABLET | Freq: Once | ORAL | Status: DC | PRN

## 2023-08-17 MED ORDER — FENTANYL CITRATE (PF) 100 MCG/2ML IJ SOLN
INTRAMUSCULAR | Status: DC | PRN
  Administered 2023-08-17 (×2): 25 ug via INTRAVENOUS

## 2023-08-17 SURGICAL SUPPLY — 39 items
BAND RUBBER #18 3X1/16 STRL (MISCELLANEOUS) ×2 IMPLANT
BLADE MINI RND TIP GREEN BEAV (BLADE) ×1 IMPLANT
BLADE SURG 15 STRL LF DISP TIS (BLADE) ×1 IMPLANT
BNDG ELASTIC 3INX 5YD STR LF (GAUZE/BANDAGES/DRESSINGS) ×1 IMPLANT
BNDG ESMARK 4X9 LF (GAUZE/BANDAGES/DRESSINGS) ×1 IMPLANT
BRUSH SCRUB EZ PLAIN DRY (MISCELLANEOUS) ×1 IMPLANT
CANISTER SUCT 1200ML W/VALVE (MISCELLANEOUS) ×1 IMPLANT
CORD BIPOLAR FORCEPS 12FT (ELECTRODE) ×1 IMPLANT
COVER BACK TABLE 60X90IN (DRAPES) ×1 IMPLANT
CUFF TOURN SGL QUICK 18X4 (TOURNIQUET CUFF) ×1 IMPLANT
DRAPE EXTREMITY T 121X128X90 (DISPOSABLE) ×1 IMPLANT
DRAPE SURG 17X23 STRL (DRAPES) ×1 IMPLANT
DRAPE U-SHAPE 47X51 STRL (DRAPES) ×1 IMPLANT
DURAPREP 26ML APPLICATOR (WOUND CARE) ×1 IMPLANT
GAUZE SPONGE 4X4 12PLY STRL (GAUZE/BANDAGES/DRESSINGS) ×1 IMPLANT
GAUZE XEROFORM 1X8 LF (GAUZE/BANDAGES/DRESSINGS) ×1 IMPLANT
GLOVE BIOGEL PI IND STRL 7.5 (GLOVE) ×1 IMPLANT
GLOVE ECLIPSE 7.0 STRL STRAW (GLOVE) ×1 IMPLANT
GLOVE INDICATOR 7.0 STRL GRN (GLOVE) ×1 IMPLANT
GLOVE SURG SYN 7.5 E (GLOVE) ×2 IMPLANT
GLOVE SURG SYN 7.5 PF PI (GLOVE) ×2 IMPLANT
GOWN STRL REUS W/ TWL LRG LVL3 (GOWN DISPOSABLE) ×1 IMPLANT
GOWN STRL SURGICAL XL XLNG (GOWN DISPOSABLE) ×2 IMPLANT
NDL HYPO 25X1 1.5 SAFETY (NEEDLE) ×1 IMPLANT
NEEDLE HYPO 25X1 1.5 SAFETY (NEEDLE) ×1 IMPLANT
NS IRRIG 1000ML POUR BTL (IV SOLUTION) ×1 IMPLANT
PACK BASIN DAY SURGERY FS (CUSTOM PROCEDURE TRAY) ×1 IMPLANT
PAD CAST 3X4 CTTN HI CHSV (CAST SUPPLIES) ×1 IMPLANT
SHEET MEDIUM DRAPE 40X70 STRL (DRAPES) ×1 IMPLANT
SPIKE FLUID TRANSFER (MISCELLANEOUS) IMPLANT
SPONGE T-LAP 18X18 ~~LOC~~+RFID (SPONGE) ×1 IMPLANT
STOCKINETTE 4X48 STRL (DRAPES) ×1 IMPLANT
SUT ETHILON 3 0 PS 1 (SUTURE) ×1 IMPLANT
SYR 10ML LL (SYRINGE) IMPLANT
SYR BULB EAR ULCER 3OZ GRN STR (SYRINGE) ×1 IMPLANT
SYR CONTROL 10ML LL (SYRINGE) IMPLANT
TOWEL GREEN STERILE FF (TOWEL DISPOSABLE) ×1 IMPLANT
TRAY DSU PREP LF (CUSTOM PROCEDURE TRAY) ×1 IMPLANT
UNDERPAD 30X36 HEAVY ABSORB (UNDERPADS AND DIAPERS) ×1 IMPLANT

## 2023-08-17 NOTE — Anesthesia Procedure Notes (Signed)
 Procedure Name: MAC Date/Time: 08/17/2023 2:25 PM  Performed by: Lucky Sable, CRNAPre-anesthesia Checklist: Timeout performed, Patient being monitored, Suction available, Emergency Drugs available and Patient identified Patient Re-evaluated:Patient Re-evaluated prior to induction Oxygen Delivery Method: Simple face mask Preoxygenation: Pre-oxygenation with 100% oxygen Induction Type: IV induction Placement Confirmation: breath sounds checked- equal and bilateral, positive ETCO2 and CO2 detector

## 2023-08-17 NOTE — Op Note (Signed)
   Carpal tunnel op note  DATE OF SURGERY:08/17/2023  PREOPERATIVE DIAGNOSIS:  Left carpal tunnel syndrome  POSTOPERATIVE DIAGNOSIS: same  PROCEDURE: Left carpal tunnel release. CPT 91478  SURGEON: Edison Gore, M.D.  ASSIST: Sharran Decent, [place]  ANESTHESIA:  Local and MAC  TOURNIQUET TIME: less than 10 minutes  BLOOD LOSS: Minimal.  COMPLICATIONS: None.  PATHOLOGY: None.  INDICATIONS: The patient is a 63 y.o. -year-old female who presented with carpal tunnel syndrome failing nonsurgical management, indicated for surgical release.  DESCRIPTION OF PROCEDURE: The patient was identified in the preoperative holding area.  The operative site was marked by the surgeon and confirmed by the patient.  The patient was brought back to the operating room.  MAC anesthesia was administered.  Local anesthetic with epi was injected into the operative site.  A well padded nonsterile tourniquet was placed. The operative extremity was prepped and draped in standard sterile fashion.  A timeout was performed.  Preoperative antibiotics were given.   A palmar incision was made about 5 mm ulnar to the thenar crease.  The palmar aponeurosis was exposed and divided in line with the skin incision. The palmaris brevis was visualized and divided.  The distal edge of the transcarpal ligament was identified. A hemostat was inserted into the carpal tunnel to protect the median nerve and the flexor tendons. Then, the transverse carpal ligament was released under direct visualization. Proximally, a subcutaneous tunnel was made allowing a [place]ell retractor to be placed. Then, the distal portion of the antebrachial fascia was released. Distally, all fibrous bands were released. The median nerve was visualized, and the fat pad was exposed. Wound was irrigated and closed with 4-0 nylon sutures. Sterile dressing applied. The patient was transferred to the recovery room in stable condition after all counts were  correct.  POSTOPERATIVE PLAN: To start nerve gliding exercises as tolerated and no heavy lifting for four weeks.  Claria Crofts, M.D. [facility]  2:43 PM

## 2023-08-17 NOTE — Anesthesia Preprocedure Evaluation (Signed)
 Anesthesia Evaluation  Patient identified by MRN, date of birth, ID band Patient awake    Reviewed: Allergy & Precautions, H&P , NPO status , Patient's Chart, lab work & pertinent test results  Airway Mallampati: II  TM Distance: >3 FB Neck ROM: Full    Dental no notable dental hx.    Pulmonary neg pulmonary ROS   Pulmonary exam normal breath sounds clear to auscultation       Cardiovascular hypertension, Pt. on medications negative cardio ROS Normal cardiovascular exam Rhythm:Regular Rate:Normal     Neuro/Psych negative neurological ROS  negative psych ROS   GI/Hepatic Neg liver ROS,GERD  ,,  Endo/Other  negative endocrine ROSdiabetes, Type 2    Renal/GU negative Renal ROS  negative genitourinary   Musculoskeletal  (+) Arthritis , Osteoarthritis,    Abdominal   Peds negative pediatric ROS (+)  Hematology negative hematology ROS (+)   Anesthesia Other Findings   Reproductive/Obstetrics negative OB ROS                             Anesthesia Physical Anesthesia Plan  ASA: 3  Anesthesia Plan: MAC   Post-op Pain Management: Minimal or no pain anticipated   Induction: Intravenous  PONV Risk Score and Plan: 2 and Propofol infusion and Treatment may vary due to age or medical condition  Airway Management Planned: Simple Face Mask  Additional Equipment:   Intra-op Plan:   Post-operative Plan:   Informed Consent: I have reviewed the patients History and Physical, chart, labs and discussed the procedure including the risks, benefits and alternatives for the proposed anesthesia with the patient or authorized representative who has indicated his/her understanding and acceptance.     Dental advisory given  Plan Discussed with: CRNA  Anesthesia Plan Comments:        Anesthesia Quick Evaluation

## 2023-08-17 NOTE — Transfer of Care (Signed)
 Immediate Anesthesia Transfer of Care Note  Patient: [name]  Procedure(s) Performed: Procedure(s) (LRB): CARPAL TUNNEL RELEASE (Left)  Patient Location: PACU  Anesthesia Type: MAC  Level of Consciousness: awake, sedated, patient cooperative and responds to stimulation, sleepy stable   Airway & Oxygen Therapy: Patient Spontanous Breathing and Patient connected to [place] oxygen  Post-op Assessment: Report given to PACU RN, Post -op Vital signs reviewed and stable and Patient sleepy   Post vital signs: Reviewed and stable  Complications: No apparent anesthesia complications

## 2023-08-17 NOTE — Anesthesia Postprocedure Evaluation (Signed)
 Anesthesia Post Note  Patient: [name]  Procedure(s) Performed: CARPAL TUNNEL RELEASE (Left: Wrist)     Patient location during evaluation: PACU Anesthesia Type: MAC Level of consciousness: awake and alert Pain management: pain level controlled Vital Signs Assessment: post-procedure vital signs reviewed and stable Respiratory status: spontaneous breathing, nonlabored ventilation and respiratory function stable Cardiovascular status: blood pressure returned to baseline and stable Postop Assessment: no apparent nausea or vomiting Anesthetic complications: no   No notable events documented.  Last Vitals:  Vitals:   08/17/23 1510 08/17/23 1515  BP:  123/70  Pulse:  (!) 59  Resp:  12  Temp: (!) 36.2 C   SpO2:  94%    Last Pain:  Vitals:   08/17/23 1302  TempSrc: Oral  PainSc: 0-No pain                 Earvin Goldberg

## 2023-08-17 NOTE — H&P (Signed)
 PREOPERATIVE H&P  Chief Complaint: left carpal tunnel syndrome  HPI: [name] is a 63 y.o. female who presents for surgical treatment of left carpal tunnel syndrome.  She denies any changes in medical history.  Past Surgical History:  Procedure Laterality Date   ABDOMINAL HYSTERECTOMY     ANKLE FRACTURE SURGERY Left    Social History   Socioeconomic History   Marital status: Single    Spouse name: Not on file   Number of children: 2   Years of education: Not on file   Highest education level: 11th grade  Occupational History   Not on file  Tobacco Use   Smoking status: Never   Smokeless tobacco: Never  Vaping Use   Vaping status: Never Used  Substance and Sexual Activity   Alcohol use: Yes    Alcohol/week: 3.0 standard drinks of alcohol    Types: 3 Cans of beer per week    Comment: social   Drug use: Yes    Frequency: 2.0 times per week    Types: Marijuana    Comment: last smoked 2 wks ago   Sexual activity: Not Currently    Birth control/protection: Surgical    Comment: hyst  Other Topics Concern   Not on file  Social History Narrative   Not on file   Social Drivers of Health   Financial Resource Strain: Not on file  Food Insecurity: Food Insecurity Present (07/05/2022)   Hunger Vital Sign    Worried About Running Out of Food in the Last Year: Sometimes true    Ran Out of Food in the Last Year: Sometimes true  Transportation Needs: No Transportation Needs (06/21/2019)   PRAPARE - Administrator, Civil Service (Medical): No    Lack of Transportation (Non-Medical): No  Physical Activity: Not on file  Stress: Not on file  Social Connections: Socially Isolated (07/05/2022)   Social Connection and Isolation Panel [NHANES]    Frequency of Communication with Friends and Family: More than three times a week    Frequency of Social Gatherings with Friends and Family: Twice a week    Attends Religious Services: Never    Database administrator or  Organizations: No    Attends Engineer, structural: Never    Marital Status: Never married   Family History  Problem Relation Age of Onset   Hypertension Mother    Diabetes Mother    Cancer Father    Diabetes Brother    Colon cancer Neg Hx    Rectal cancer Neg Hx    Stomach cancer Neg Hx    No Known Allergies Prior to Admission medications   Medication Sig Start Date End Date Taking? Authorizing Provider  Cholecalciferol (VITAMIN D3) 50 MCG (2000 UT) capsule Take 2,000 Units by mouth daily.   Yes [provider]  empagliflozin  (JARDIANCE ) 10 MG TABS tablet Take 1 tablet (10 mg total) by mouth daily before breakfast. 05/23/23  Yes Masters, Katie, DO  losartan -hydrochlorothiazide  (HYZAAR ) 50-12.5 MG tablet Take 1 tablet by mouth daily. 05/23/23  Yes Masters, Katie, DO  metFORMIN  (GLUCOPHAGE ) 500 MG tablet Take 2 tablets (1,000 mg total) by mouth daily with breakfast. 07/05/22 08/28/23 Yes Jackolyn Masker, MD  pantoprazole  (PROTONIX ) 40 MG tablet Take 1 tablet (40 mg total) by mouth daily as needed. 05/23/23  Yes Masters, Katie, DO  polyethylene glycol (MIRALAX  / GLYCOLAX ) 17 g packet Take 17 g by mouth daily.   Yes [provider]  rosuvastatin  (CRESTOR ) 20 MG tablet Take 1 tablet (20 mg total) by mouth daily. 05/23/23  Yes Masters, Katie, DO  HYDROcodone -acetaminophen  (NORCO/VICODIN) 5-325 MG tablet Take 1 tablet by mouth 3 (three) times daily as needed for moderate pain (pain score 4-6). 08/17/23   Sandie Cross, PA-C  ondansetron  (ZOFRAN ) 4 MG tablet Take 1 tablet (4 mg total) by mouth every 8 (eight) hours as needed for nausea or vomiting. 08/17/23   Sandie Cross, PA-C     Positive ROS: All other systems have been reviewed and were otherwise negative with the exception of those mentioned in the HPI and as above.  Physical Exam: General: Alert, no acute distress Cardiovascular: No pedal edema Respiratory: No cyanosis, no use of accessory musculature GI:  abdomen soft Skin: No lesions in the area of chief complaint Neurologic: Sensation intact distally Psychiatric: Patient is competent for consent with normal mood and affect Lymphatic: no lymphedema  MUSCULOSKELETAL: exam stable  Assessment: left carpal tunnel syndrome  Plan: Plan for Procedure(s): CARPAL TUNNEL RELEASE  The risks benefits and alternatives were discussed with the patient including but not limited to the risks of nonoperative treatment, versus surgical intervention including infection, bleeding, nerve injury,  blood clots, cardiopulmonary complications, morbidity, mortality, among others, and they were willing to proceed.   Claria Crofts, MD 08/17/2023 2:09 PM

## 2023-08-17 NOTE — Discharge Instructions (Addendum)
 Postoperative instructions:  Weightbearing instructions: don't lift more than 10 lbs for 4 weeks  Dressing instructions: Keep your dressing and/or splint clean and dry at all times.  It will be removed at your first post-operative appointment.  Your stitches and/or staples will be removed at this visit.  Incision instructions:  Do not soak your incision for 3 weeks after surgery.  If the incision gets wet, pat dry and do not scrub the incision.  Pain control:  You have been given a prescription to be taken as directed for post-operative pain control.  In addition, elevate the operative extremity above the heart at all times to prevent swelling and throbbing pain.  Take over-the-counter Colace, 100mg  by mouth twice a day while taking narcotic pain medications to help prevent constipation.  Follow up appointments: 1) 10 days for suture removal and wound check. 2) Dr. Christiane Cowing as scheduled.   -------------------------------------------------------------------------------------------------------------  After Surgery Pain Control:  After your surgery, post-surgical discomfort or pain is likely. This discomfort can last several days to a few weeks. At certain times of the day your discomfort may be more intense.  Did you receive a nerve block?  A nerve block can provide pain relief for one hour to two days after your surgery. As long as the nerve block is working, you will experience little or no sensation in the area the surgeon operated on.  As the nerve block wears off, you will begin to experience pain or discomfort. It is very important that you begin taking your prescribed pain medication before the nerve block fully wears off. Treating your pain at the first sign of the block wearing off will ensure your pain is better controlled and more tolerable when full-sensation returns. Do not wait until the pain is intolerable, as the medicine will be less effective. It is better to treat pain in  advance than to try and catch up.  General Anesthesia:  If you did not receive a nerve block during your surgery, you will need to start taking your pain medication shortly after your surgery and should continue to do so as prescribed by your surgeon.  Pain Medication:  Most commonly we prescribe Vicodin and Percocet for post-operative pain. Both of these medications contain a combination of acetaminophen  (Tylenol ) and a narcotic to help control pain.   It takes between 30 and 45 minutes before pain medication starts to work. It is important to take your medication before your pain level gets too intense.   Nausea is a common side effect of many pain medications. You will want to eat something before taking your pain medicine to help prevent nausea.   If you are taking a prescription pain medication that contains acetaminophen , we recommend that you do not take additional over the counter acetaminophen  (Tylenol ).  Other pain relieving options:   Using a cold pack to ice the affected area a few times a day (15 to 20 minutes at a time) can help to relieve pain, reduce swelling and bruising.   Elevation of the affected area can also help to reduce pain and swelling.  Per [facility] clinic policy, our goal is ensure optimal postoperative pain control with a multimodal pain management strategy. For all [facility] patients, our goal is to wean post-operative narcotic medications by 6 weeks post-operatively. If this is not possible due to utilization of pain medication prior to surgery, your [facility] doctor will support your acute post-operative pain control for the first 6 weeks postoperatively, with a plan  to transition you back to your primary pain team following that. Max Spain will work to ensure a Therapist, occupational.   Post Anesthesia Home Care Instructions  Activity: Get plenty of rest for the remainder of the day. A responsible individual must stay with you for 24 hours following the procedure.   For the next 24 hours, DO NOT: -Drive a car -Advertising copywriter -Drink alcoholic beverages -Take any medication unless instructed by your physician -Make any legal decisions or sign important papers.  Meals: Start with liquid foods such as gelatin or soup. Progress to regular foods as tolerated. Avoid greasy, spicy, heavy foods. If nausea and/or vomiting occur, drink only clear liquids until the nausea and/or vomiting subsides. Call your physician if vomiting continues.  Special Instructions/Symptoms: Your throat may feel dry or sore from the anesthesia or the breathing tube placed in your throat during surgery. If this causes discomfort, gargle with warm salt water. The discomfort should disappear within 24 hours.  If you had a scopolamine patch placed behind your ear for the management of post- operative nausea and/or vomiting:  1. The medication in the patch is effective for 72 hours, after which it should be removed.  Wrap patch in a tissue and discard in the trash. Wash hands thoroughly with soap and water. 2. You may remove the patch earlier than 72 hours if you experience unpleasant side effects which may include dry mouth, dizziness or visual disturbances. 3. Avoid touching the patch. Wash your hands with soap and water after contact with the patch.    Next dose of ibuprofen  will be at 10:50pm

## 2023-08-18 ENCOUNTER — Encounter (HOSPITAL_BASED_OUTPATIENT_CLINIC_OR_DEPARTMENT_OTHER): Payer: Self-pay | Admitting: Orthopaedic Surgery

## 2023-08-22 ENCOUNTER — Ambulatory Visit: Admitting: Student

## 2023-08-22 ENCOUNTER — Other Ambulatory Visit (HOSPITAL_COMMUNITY): Payer: Self-pay

## 2023-08-22 ENCOUNTER — Encounter: Payer: Self-pay | Admitting: Student

## 2023-08-22 VITALS — BP 125/78 | HR 65 | Temp 98.4°F | Ht 71.0 in | Wt 210.2 lb

## 2023-08-22 DIAGNOSIS — E785 Hyperlipidemia, unspecified: Secondary | ICD-10-CM | POA: Diagnosis not present

## 2023-08-22 DIAGNOSIS — E1165 Type 2 diabetes mellitus with hyperglycemia: Secondary | ICD-10-CM | POA: Diagnosis not present

## 2023-08-22 DIAGNOSIS — Z7984 Long term (current) use of oral hypoglycemic drugs: Secondary | ICD-10-CM | POA: Diagnosis not present

## 2023-08-22 DIAGNOSIS — L84 Corns and callosities: Secondary | ICD-10-CM

## 2023-08-22 DIAGNOSIS — E1169 Type 2 diabetes mellitus with other specified complication: Secondary | ICD-10-CM

## 2023-08-22 DIAGNOSIS — I10 Essential (primary) hypertension: Secondary | ICD-10-CM

## 2023-08-22 LAB — POCT GLYCOSYLATED HEMOGLOBIN (HGB A1C): Hemoglobin A1C: 8 % — AB (ref 4.0–5.6)

## 2023-08-22 LAB — GLUCOSE, CAPILLARY: Glucose-Capillary: 139 mg/dL — ABNORMAL HIGH (ref 70–99)

## 2023-08-22 MED ORDER — METFORMIN HCL 500 MG PO TABS
1000.0000 mg | ORAL_TABLET | Freq: Two times a day (BID) | ORAL | 5 refills | Status: AC
  Filled 2023-08-22: qty 360, 90d supply, fill #0
  Filled 2023-12-12: qty 360, 90d supply, fill #1

## 2023-08-22 NOTE — Assessment & Plan Note (Signed)
 Patient is currently on Crestor  20 mg daily.  Most recent LDL 122.  Will trial lifestyle modifications.  If not better then we will need to increase to 40.  Plan: - Continue Crestor  20 mg daily - Initiate lifestyle modification - At next visit obtain lipid panel, and if LDL greater than 100, will need to increase to 40 mg of Crestor 

## 2023-08-22 NOTE — Assessment & Plan Note (Addendum)
 Patient has a past medical history of type 2 diabetes mellitus.  A1c today is 8.0.  This is up from 7.6 3 months ago.  Patient current medications include Jardiance  10 mg daily and metformin  1000 mg daily.  She states that she has not been watching what she has been eating and has not been exercising.  I did talk to her about potentially adding a new medication (GLP-1), but patient was against this.  She states she would like to make lifestyle modifications.  She knows that she has to eat better and exercise.  I updated foot exam today.  Patient has been referred for ophthalmology exam already.  Instructed her that we can try lifestyle changes but, but if this does not work we will likely have to add a new medication.  Plan: - Increase metformin  to 1000 mg twice daily - Continue Jardiance  10 mg daily - Handout provided for lifestyle changes - Urine microalbumin creatinine ratio pending - Referral for ophthalmology pending - Follow-up in 3 months, if A1c remains uncontrolled, could potentially try GLP-1 -Patient referred to [facility] program for exercise

## 2023-08-22 NOTE — Progress Notes (Signed)
 CC: Diabetes follow-up  HPI:  [name] is a 63 y.o. female with a past medical history of hyperlipidemia, type 2 diabetes mellitus, hypertension who presents for follow-up appointment.  Please see assessment and plan for full HPI  Medications: Diabetes: Jardiance  10 mg daily, metformin  1000 mg twice daily Hyperlipidemia: Crestor  20 mg daily GERD: Protonix  40 mg daily Hypertension: Losartan -HCTZ 50-12.5 mg daily   Past Medical History:  Diagnosis Date   Acute non-recurrent maxillary sinusitis 03/30/2021   Bilateral carpal tunnel syndrome    Constipation    Diabetes (HCC)    DIVERTICULAR DISEASE 11/26/2009   Annotation: noted in barium enema  Qualifier: Diagnosis of   By: Gaylyn Keas FNP, Davene Ernst      Replacing diagnoses that were inactivated after the 06/07/22 regulatory import   GERD (gastroesophageal reflux disease)    Hypercholesteremia    Hypertension    MENOPAUSE, SURGICAL 03/08/2004   Qualifier: Diagnosis of   By: Gaylyn Keas FNP, [place]       OAB (overactive bladder)    Rheumatoid arthritis (HCC)      Current Outpatient Medications:    Cholecalciferol (VITAMIN D3) 50 MCG (2000 UT) capsule, Take 2,000 Units by mouth daily., Disp: , Rfl:    empagliflozin  (JARDIANCE ) 10 MG TABS tablet, Take 1 tablet (10 mg total) by mouth daily before breakfast., Disp: 90 tablet, Rfl: 3   HYDROcodone -acetaminophen  (NORCO/VICODIN) 5-325 MG tablet, Take 1 tablet by mouth 3 (three) times daily as needed for moderate pain (pain score 4-6)., Disp: 21 tablet, Rfl: 0   losartan -hydrochlorothiazide  (HYZAAR ) 50-12.5 MG tablet, Take 1 tablet by mouth daily., Disp: 90 tablet, Rfl: 3   metFORMIN  (GLUCOPHAGE ) 500 MG tablet, Take 2 tablets (1,000 mg total) by mouth 2 (two) times daily with a meal., Disp: 120 tablet, Rfl: 5   ondansetron  (ZOFRAN ) 4 MG tablet, Take 1 tablet (4 mg total) by mouth every 8 (eight) hours as needed for nausea or vomiting., Disp: 15 tablet, Rfl: 0   pantoprazole  (PROTONIX ) 40  MG tablet, Take 1 tablet (40 mg total) by mouth daily as needed., Disp: 30 tablet, Rfl: 2   polyethylene glycol (MIRALAX  / GLYCOLAX ) 17 g packet, Take 17 g by mouth daily., Disp: , Rfl:    rosuvastatin  (CRESTOR ) 20 MG tablet, Take 1 tablet (20 mg total) by mouth daily., Disp: 90 tablet, Rfl: 3  Review of Systems:    Endocrine: Patient endorses polyuria  Physical Exam:  Vitals:   08/22/23 0851  BP: 125/78  Pulse: 65  Temp: 98.4 F (36.9 C)  TempSrc: Oral  SpO2: 95%  Weight: 210 lb 3.2 oz (95.3 kg)  Height: 5' 11 (1.803 m)   General: Patient is sitting comfortably in the room  Head: Normocephalic, atraumatic  Cardio: Regular rate and rhythm, no murmurs, rubs or gallops Pulmonary: Clear to ausculation bilaterally with no rales, rhonchi, and crackles  Extremities: Bilateral lower extremities with calluses noted to plantar surface of both lower extremities.  No obvious ulcerations.  No obvious signs of infection.  2+ pedal pulses  Assessment & Plan:   Essential hypertension Patient has a past medical history of hypertension.  Blood pressure today 125/78.  Is well-controlled.  Continue current medications.  Plan: - Continue losartan -HCTZ 50-12.5 mg daily  Type 2 diabetes mellitus with hyperglycemia, without long-term current use of insulin (HCC) Patient has a past medical history of type 2 diabetes mellitus.  A1c today is 8.0.  This is up from 7.6 3 months ago.  Patient current medications  include Jardiance  10 mg daily and metformin  1000 mg daily.  She states that she has not been watching what she has been eating and has not been exercising.  I did talk to her about potentially adding a new medication (GLP-1), but patient was against this.  She states she would like to make lifestyle modifications.  She knows that she has to eat better and exercise.  I updated foot exam today.  Patient has been referred for ophthalmology exam already.  Instructed her that we can try lifestyle changes  but, but if this does not work we will likely have to add a new medication.  Plan: - Increase metformin  to 1000 mg twice daily - Continue Jardiance  10 mg daily - Handout provided for lifestyle changes - Urine microalbumin creatinine ratio pending - Referral for ophthalmology pending - Follow-up in 3 months, if A1c remains uncontrolled, could potentially try GLP-1 -Patient referred to [facility] program for exercise  Hyperlipidemia associated with type 2 diabetes mellitus (HCC) Patient is currently on Crestor  20 mg daily.  Most recent LDL 122.  Will trial lifestyle modifications.  If not better then we will need to increase to 40.  Plan: - Continue Crestor  20 mg daily - Initiate lifestyle modification - At next visit obtain lipid panel, and if LDL greater than 100, will need to increase to 40 mg of Crestor   Patient discussed with Dr. Teola Felling, DO PGY-2 Internal Medicine Resident

## 2023-08-22 NOTE — Assessment & Plan Note (Addendum)
 Patient has a past medical history of hypertension.  Blood pressure today 125/78.  Is well-controlled.  Continue current medications.  Plan: - Continue losartan -HCTZ 50-12.5 mg daily

## 2023-08-22 NOTE — Patient Instructions (Addendum)
 [name],Thank you for allowing me to take part in your care today.  Here are your instructions.  1. Regarding your diabetes, your A1c is a 8.0. I want you to start focusing on your lifestyle changes.   2. I have given you a handout for this.  3. Increase your metformin  to take 2 tablets in the morning and 2 in the evening.   4. Please wait for phone call to get appointment with the foot doctor.   5. Come back in 3 months. At that time we need to discuss your diabetes and we need to discuss your cholesterol.    PLEASE BRING YOUR MEDICATIONS TO EVERY APPOINTMENT  Thank you, Dr. Lydia Sams  If you have any other questions please contact the internal medicine clinic at [phone] If it is after hours, please call the [place] at [phone] and then ask the person who picks up for the resident on call.

## 2023-08-23 NOTE — Progress Notes (Signed)
 Internal Medicine Clinic Attending  Case discussed with the resident at the time of the visit.  We reviewed the resident's history and exam and pertinent patient test results.  I agree with the assessment, diagnosis, and plan of care documented in the resident's note.

## 2023-08-24 ENCOUNTER — Ambulatory Visit (INDEPENDENT_AMBULATORY_CARE_PROVIDER_SITE_OTHER): Admitting: Physician Assistant

## 2023-08-24 ENCOUNTER — Other Ambulatory Visit (HOSPITAL_COMMUNITY): Payer: Self-pay

## 2023-08-24 DIAGNOSIS — G5602 Carpal tunnel syndrome, left upper limb: Secondary | ICD-10-CM

## 2023-08-24 DIAGNOSIS — Z9889 Other specified postprocedural states: Secondary | ICD-10-CM

## 2023-08-24 NOTE — Progress Notes (Signed)
 Post-Op Visit Note   Patient: [name]           Date of Birth: 1960/12/20           MRN: [id number] Visit Date: 08/24/2023 PCP: Jonelle Neri, DO   Assessment & Plan:  Chief Complaint:  Chief Complaint  Patient presents with   Left Wrist - Follow-up    Left carpal tunnel release 08/17/2023   Visit Diagnoses:  1. Carpal tunnel syndrome on left   2. S/P carpal tunnel release     Plan: Patient is a pleasant 63 year old female comes in today 1 week status post left carpal tunnel release 08/17/2023.  She is doing well.  She still notes paresthesias throughout the median nerve distribution.  Some achiness to her hand which is relieved with ibuprofen  and Norco.  Examination of her left hand reveals a well-healing surgical scar with nylon sutures in place.  No evidence of infection or cellulitis.  Fingers warm well-perfused.  Decree sensation to the thumb, index and long fingers.  Today, her wound was cleaned and recovered.  Velcro splint applied for which she will wear at all times for the next week.  She may begin nerve gliding exercises.  No heavy lifting or submerging her hand underwater until she is 4 weeks postop.  Work note provided today.  Follow-up next week for suture removal.  Call with concerns or questions.  Follow-Up Instructions: Return in about 1 week (around 08/31/2023).   Orders:  No orders of the defined types were placed in this encounter.  No orders of the defined types were placed in this encounter.   Imaging: No new imaging  PMFS History: Patient Active Problem List   Diagnosis Date Noted   Right Back pain, CVA tenderness 06/16/2023   Flu vaccine refused 04/14/2023   Carpal tunnel syndrome on left 03/22/2023   Vitamin D  deficiency 09/14/2022   Healthcare maintenance 07/08/2022   RUQ abdominal pain 07/08/2022   Abdominal mass, left upper quadrant 07/08/2022   Osteoarthritis of ankle and foot 05/03/2022   Type 2 diabetes mellitus with hyperglycemia,  without long-term current use of insulin (HCC) 03/30/2021   Hyperlipidemia associated with type 2 diabetes mellitus (HCC) 03/30/2021   Rheumatoid factor positive 12/10/2016   Chronic arthralgias of knees and hips 12/10/2016   Allergic rhinitis 10/16/2009   Constipation 03/11/2009   Essential hypertension 02/19/2009   GERD 02/19/2009   Past Medical History:  Diagnosis Date   Acute non-recurrent maxillary sinusitis 03/30/2021   Bilateral carpal tunnel syndrome    Constipation    Diabetes (HCC)    DIVERTICULAR DISEASE 11/26/2009   Annotation: noted in barium enema  Qualifier: Diagnosis of   By: Gaylyn Keas FNP, Davene Ernst      Replacing diagnoses that were inactivated after the 06/07/22 regulatory import   GERD (gastroesophageal reflux disease)    Hypercholesteremia    Hypertension    MENOPAUSE, SURGICAL 03/08/2004   Qualifier: Diagnosis of   By: Gaylyn Keas FNP, Nykedtra       OAB (overactive bladder)    Rheumatoid arthritis (HCC)     Family History  Problem Relation Age of Onset   Hypertension Mother    Diabetes Mother    Cancer Father    Diabetes Brother    Colon cancer Neg Hx    Rectal cancer Neg Hx    Stomach cancer Neg Hx     Past Surgical History:  Procedure Laterality Date   ABDOMINAL HYSTERECTOMY     ANKLE FRACTURE  SURGERY Left    CARPAL TUNNEL RELEASE Left 08/17/2023   Procedure: CARPAL TUNNEL RELEASE;  Surgeon: Wes Hamman, MD;  Location: [facility];  Service: Orthopedics;  Laterality: Left;   Social History   Occupational History   Not on file  Tobacco Use   Smoking status: Never   Smokeless tobacco: Never  Vaping Use   Vaping status: Never Used  Substance and Sexual Activity   Alcohol use: Yes    Alcohol/week: 3.0 standard drinks of alcohol    Types: 3 Cans of beer per week    Comment: social   Drug use: Yes    Frequency: 2.0 times per week    Types: Marijuana    Comment: last smoked 2 wks ago   Sexual activity: Not Currently    Birth  control/protection: Surgical    Comment: hyst

## 2023-08-25 ENCOUNTER — Telehealth: Payer: Self-pay

## 2023-08-25 ENCOUNTER — Ambulatory Visit: Payer: Self-pay | Admitting: Student

## 2023-08-25 LAB — MICROALBUMIN / CREATININE URINE RATIO
Creatinine, Urine: 26.2 mg/dL
Microalb/Creat Ratio: <11 mg/g{creat} (ref 0–29)
Microalbumin, Urine: <3 ug/mL

## 2023-08-25 NOTE — Telephone Encounter (Signed)
 Called  re: PREP program referral, left voicemail requesting return call.

## 2023-08-31 ENCOUNTER — Ambulatory Visit (INDEPENDENT_AMBULATORY_CARE_PROVIDER_SITE_OTHER): Admitting: Physician Assistant

## 2023-08-31 DIAGNOSIS — Z9889 Other specified postprocedural states: Secondary | ICD-10-CM

## 2023-08-31 DIAGNOSIS — G5602 Carpal tunnel syndrome, left upper limb: Secondary | ICD-10-CM

## 2023-08-31 NOTE — Progress Notes (Signed)
 Post-Op Visit Note   Patient: [name]           Date of Birth: [birth year]           MRN: [id number] Visit Date: 08/31/2023 PCP: Tobie Gaines, DO   Assessment & Plan:  Chief Complaint:  Chief Complaint  Patient presents with   Left Hand - Pain, Routine Post Op   Visit Diagnoses:  1. S/P carpal tunnel release   2. Carpal tunnel syndrome on left     Plan: Patient is a pleasant 63 year old female who comes in today 2 weeks status post left carpal tunnel release for moderate to severe carpal tunnel syndrome, date of surgery 08/17/2023.  She has been doing well in regards to pain.  Not taking anything for this.  She still notes decree sensation throughout median nerve distribution.  Examination of her left hand reveals a well-healed surgical incision with nylon sutures in place.  No evidence of infection or cellulitis.  Decree sensation to the thumb, index and long fingers.  Fingers are warm well-perfused.  Today, sutures were removed and Steri-Strips applied.  No heavy lifting or submerging her hand underwater for 2 more weeks.  She will continue with nerve gliding exercises.  Follow-up in 2 weeks for recheck.  Call with concerns or questions.  Follow-Up Instructions: Return in about 2 weeks (around 09/14/2023).   Orders:  No orders of the defined types were placed in this encounter.  No orders of the defined types were placed in this encounter.   Imaging: Imaging  PMFS History: Patient Active Problem List   Diagnosis Date Noted   Right Back pain, CVA tenderness 06/16/2023   Flu vaccine refused 04/14/2023   Carpal tunnel syndrome on left 03/22/2023   Vitamin D  deficiency 09/14/2022   Healthcare maintenance 07/08/2022   RUQ abdominal pain 07/08/2022   Abdominal mass, left upper quadrant 07/08/2022   Osteoarthritis of ankle and foot 05/03/2022   Type 2 diabetes mellitus with hyperglycemia, without long-term current use of insulin (HCC) 03/30/2021   Hyperlipidemia  associated with type 2 diabetes mellitus (HCC) 03/30/2021   Rheumatoid factor positive 12/10/2016   Chronic arthralgias of knees and hips 12/10/2016   Allergic rhinitis 10/16/2009   Constipation 03/11/2009   Essential hypertension 02/19/2009   GERD 02/19/2009   Past Medical History:  Diagnosis Date   Acute non-recurrent maxillary sinusitis 03/30/2021   Bilateral carpal tunnel syndrome    Constipation    Diabetes (HCC)    DIVERTICULAR DISEASE 11/26/2009   Annotation: noted in barium enema  Qualifier: Diagnosis of   By: Gladis FNP, Delorise      Replacing diagnoses that were inactivated after the 06/07/22 regulatory import   GERD (gastroesophageal reflux disease)    Hypercholesteremia    Hypertension    MENOPAUSE, SURGICAL 03/08/2004   Qualifier: Diagnosis of   By: Gladis FNP, Nykedtra       OAB (overactive bladder)    Rheumatoid arthritis (HCC)     Family History  Problem Relation Age of Onset   Hypertension Mother    Diabetes Mother    Cancer Father    Diabetes Brother    Colon cancer Neg Hx    Rectal cancer Neg Hx    Stomach cancer Neg Hx     Past Surgical History:  Procedure Laterality Date   ABDOMINAL HYSTERECTOMY     ANKLE FRACTURE SURGERY Left    CARPAL TUNNEL RELEASE Left 08/17/2023   Procedure: CARPAL TUNNEL RELEASE;  Surgeon: Jerri,  Kay HERO, MD;  Location: [facility];  Service: Orthopedics;  Laterality: Left;   Social History   Occupational History   Not on file  Tobacco Use   Smoking status: Never   Smokeless tobacco: Never  Vaping Use   Vaping status: Never Used  Substance and Sexual Activity   Alcohol use: Yes    Alcohol/week: 3.0 standard drinks of alcohol    Types: 3 Cans of beer per week    Comment: social   Drug use: Yes    Frequency: 2.0 times per week    Types: Marijuana    Comment: last smoked 2 wks ago   Sexual activity: Not Currently    Birth control/protection: Surgical    Comment: hyst

## 2023-09-01 ENCOUNTER — Telehealth: Payer: Self-pay | Admitting: Orthopaedic Surgery

## 2023-09-01 NOTE — Telephone Encounter (Signed)
 Sure that's fine.  She had CTR

## 2023-09-01 NOTE — Telephone Encounter (Signed)
 Pt called requesting a letter stating when she had surgery and what type of surgery she had. Pt is asking to pick letter up today. Please call pt when ready for pick up at [phone]

## 2023-09-01 NOTE — Telephone Encounter (Signed)
 Note made. Ready for pick up at the front desk.  Sent to her Mychart as well.

## 2023-09-05 ENCOUNTER — Telehealth: Payer: Self-pay

## 2023-09-05 NOTE — Telephone Encounter (Signed)
Called again re: PREP program referral, left voicemail requesting return call

## 2023-09-08 ENCOUNTER — Other Ambulatory Visit (HOSPITAL_COMMUNITY): Payer: Self-pay

## 2023-09-13 ENCOUNTER — Ambulatory Visit (INDEPENDENT_AMBULATORY_CARE_PROVIDER_SITE_OTHER): Admitting: Physician Assistant

## 2023-09-13 DIAGNOSIS — Z9889 Other specified postprocedural states: Secondary | ICD-10-CM

## 2023-09-13 DIAGNOSIS — G5602 Carpal tunnel syndrome, left upper limb: Secondary | ICD-10-CM

## 2023-09-13 NOTE — Progress Notes (Signed)
 Post-Op Visit Note   Patient: [name]           Date of Birth: [birth year]           MRN: [id number] Visit Date: 09/13/2023 PCP: Tobie Gaines, DO   Assessment & Plan:  Chief Complaint:  Chief Complaint  Patient presents with   Left Wrist - Follow-up    Left carpal tunnel release 08/17/2023   Visit Diagnoses:  1. Carpal tunnel syndrome on left   2. S/P carpal tunnel release     Plan: Patient is a pleasant 63 year old female who comes in today 4 weeks status post left carpal tunnel release.  She has been doing okay.  She notes some soreness as well as continued decreased sensation to the thumb, index and long fingers.  Overall, she feels as though she has improved.  Examination of her left hand reveals a fully healed surgical scar without complication.  She does have slight decrease sensation to the thumb, index and long fingertips.  Fingers are warm well-perfused.  Today, I have discussed that with moderate to severe compression of the median nerve that her sensation may take a while to return.  She understands and agrees.  She will continue with nerve gliding exercises and follow-up with us  as needed.  Follow-Up Instructions: Return if symptoms worsen or fail to improve.   Orders:  No orders of the defined types were placed in this encounter.  No orders of the defined types were placed in this encounter.   Imaging: No new imaging  PMFS History: Patient Active Problem List   Diagnosis Date Noted   Right Back pain, CVA tenderness 06/16/2023   Flu vaccine refused 04/14/2023   Carpal tunnel syndrome on left 03/22/2023   Vitamin D  deficiency 09/14/2022   Healthcare maintenance 07/08/2022   RUQ abdominal pain 07/08/2022   Abdominal mass, left upper quadrant 07/08/2022   Osteoarthritis of ankle and foot 05/03/2022   Type 2 diabetes mellitus with hyperglycemia, without long-term current use of insulin (HCC) 03/30/2021   Hyperlipidemia associated with type 2 diabetes  mellitus (HCC) 03/30/2021   Rheumatoid factor positive 12/10/2016   Chronic arthralgias of knees and hips 12/10/2016   Allergic rhinitis 10/16/2009   Constipation 03/11/2009   Essential hypertension 02/19/2009   GERD 02/19/2009   Past Medical History:  Diagnosis Date   Acute non-recurrent maxillary sinusitis 03/30/2021   Bilateral carpal tunnel syndrome    Constipation    Diabetes (HCC)    DIVERTICULAR DISEASE 11/26/2009   Annotation: noted in barium enema  Qualifier: Diagnosis of   By: Gladis FNP, Delorise      Replacing diagnoses that were inactivated after the 06/07/22 regulatory import   GERD (gastroesophageal reflux disease)    Hypercholesteremia    Hypertension    MENOPAUSE, SURGICAL 03/08/2004   Qualifier: Diagnosis of   By: Gladis FNP, Nykedtra       OAB (overactive bladder)    Rheumatoid arthritis (HCC)     Family History  Problem Relation Age of Onset   Hypertension Mother    Diabetes Mother    Cancer Father    Diabetes Brother    Colon cancer Neg Hx    Rectal cancer Neg Hx    Stomach cancer Neg Hx     Past Surgical History:  Procedure Laterality Date   ABDOMINAL HYSTERECTOMY     ANKLE FRACTURE SURGERY Left    CARPAL TUNNEL RELEASE Left 08/17/2023   Procedure: CARPAL TUNNEL RELEASE;  Surgeon: Jerri,  Kay HERO, MD;  Location: [facility];  Service: Orthopedics;  Laterality: Left;   Social History   Occupational History   Not on file  Tobacco Use   Smoking status: Never   Smokeless tobacco: Never  Vaping Use   Vaping status: Never Used  Substance and Sexual Activity   Alcohol use: Yes    Alcohol/week: 3.0 standard drinks of alcohol    Types: 3 Cans of beer per week    Comment: social   Drug use: Yes    Frequency: 2.0 times per week    Types: Marijuana    Comment: last smoked 2 wks ago   Sexual activity: Not Currently    Birth control/protection: Surgical    Comment: hyst

## 2023-09-26 ENCOUNTER — Other Ambulatory Visit: Payer: Self-pay

## 2023-09-26 ENCOUNTER — Ambulatory Visit: Admitting: Student

## 2023-09-26 ENCOUNTER — Ambulatory Visit: Payer: Self-pay

## 2023-09-26 ENCOUNTER — Encounter: Payer: Self-pay | Admitting: Student

## 2023-09-26 ENCOUNTER — Other Ambulatory Visit (HOSPITAL_COMMUNITY): Payer: Self-pay

## 2023-09-26 ENCOUNTER — Ambulatory Visit (HOSPITAL_COMMUNITY)
Admission: RE | Admit: 2023-09-26 | Discharge: 2023-09-26 | Disposition: A | Discharge status: Home / Self Care | Source: Ambulatory Visit | Attending: Internal Medicine | Admitting: Internal Medicine

## 2023-09-26 VITALS — BP 136/65 | HR 70 | Temp 98.2°F | Ht 71.0 in | Wt 208.8 lb

## 2023-09-26 DIAGNOSIS — M25552 Pain in left hip: Secondary | ICD-10-CM

## 2023-09-26 DIAGNOSIS — G8929 Other chronic pain: Secondary | ICD-10-CM

## 2023-09-26 DIAGNOSIS — M25561 Pain in right knee: Secondary | ICD-10-CM

## 2023-09-26 DIAGNOSIS — M25551 Pain in right hip: Secondary | ICD-10-CM | POA: Diagnosis present

## 2023-09-26 DIAGNOSIS — M79603 Pain in arm, unspecified: Secondary | ICD-10-CM | POA: Insufficient documentation

## 2023-09-26 DIAGNOSIS — M19042 Primary osteoarthritis, left hand: Secondary | ICD-10-CM | POA: Insufficient documentation

## 2023-09-26 DIAGNOSIS — M25562 Pain in left knee: Secondary | ICD-10-CM | POA: Diagnosis present

## 2023-09-26 DIAGNOSIS — M79602 Pain in left arm: Secondary | ICD-10-CM | POA: Diagnosis not present

## 2023-09-26 DIAGNOSIS — M79601 Pain in right arm: Secondary | ICD-10-CM

## 2023-09-26 MED ORDER — DULOXETINE HCL 30 MG PO CPEP
30.0000 mg | ORAL_CAPSULE | Freq: Every day | ORAL | 1 refills | Status: DC
  Filled 2023-09-26: qty 30, 30d supply, fill #0

## 2023-09-26 NOTE — Patient Instructions (Addendum)
 Thank you, [name] for allowing us  to provide your care today. Today we discussed your aching pain in your arms and knuckles.    Take Duloxetine  30 mg Daily. I will see you again in 2 weeks Advil  as needed Voltaren  gel 4 times daily   I have ordered the following labs for you:  Lab Orders  No laboratory test(s) ordered today     Tests ordered today:    Referrals ordered today:   Referral Orders  No referral(s) requested today     I have ordered the following medication/changed the following medications:   Stop the following medications: There are no discontinued medications.   Start the following medications: Meds ordered this encounter  Medications   DULoxetine  (CYMBALTA ) 30 MG capsule    Sig: Take 1 capsule (30 mg total) by mouth daily.    Dispense:  30 capsule    Refill:  1     Follow up: 3 months   Remember:   Should you have any questions or concerns please call the internal medicine clinic at [phone].   Chelsea Lisa Grow MD 09/26/2023, 11:42 AM   [facility]

## 2023-09-26 NOTE — Assessment & Plan Note (Addendum)
 [name] presented to the clinic with concerns of diffuse upper extremity pain affecting both her left and right sides. She reports that the pain worsens in the mornings. The pain is not new and has been intermittent for many years. She also notes pain in her hips and occasionally in her knees. Review of her chart reveals an extensive workup for rheumatoid arthritis, which has been negative to date. This lowers suspicion for RA, and therefore, disease-modifying antirheumatic drugs are not indicated at this time. The patient reports that her pain has been refractory to Tylenol  and ibuprofen . On examination, there is no swelling or deformity noted in the knuckles of her hands bilaterally. It is important to note that she recently underwent carpal tunnel release surgery about a month ago with orthopedics. Given the chronicity and widespread nature of her musculoskeletal pain, I am concerned about fibromyalgia. After a shared decision-making discussion, I will order repeat X-rays of both hands to assess for any changes from prior imaging. Additionally, I will initiate duloxetine  and monitor her response to treatment. The patient was also advised to take Tylenol  1000 mg three times daily, rather than as needed. If she does not respond adequately to duloxetine , physical therapy will be considered as the next step in her management. - XR hand bilaterally   - Prescribe duloxetine  30 mg daily - Take Tylenol  1000 mg 3 times daily - Avoid NSAIDs if possible - Consider physical therapy in future

## 2023-09-26 NOTE — Telephone Encounter (Signed)
 Call was dropped during warm transfer, patient did answer return call, lvmtcb   Copied from CRM [id number]. Topic: Clinical - Red Word Triage >> Sep 26, 2023  9:09 AM Laurier BROCKS wrote: Red Word that prompted transfer to Nurse Triage: Patient is having some arm and leg pain. Patient rates the pain at a level 10, the pain is worse at night. She has taken OTC meds but nothing is helping.

## 2023-09-26 NOTE — Progress Notes (Signed)
 CC: Diffuse upper extremity pain  HPI:  [name] is a 63 y.o. female living with a history stated below and presents today for upper extremity pain. Please see problem based assessment and plan for additional details.  Past Medical History:  Diagnosis Date   Acute non-recurrent maxillary sinusitis 03/30/2021   Bilateral carpal tunnel syndrome    Constipation    Diabetes (HCC)    DIVERTICULAR DISEASE 11/26/2009   Annotation: noted in barium enema  Qualifier: Diagnosis of   By: Gladis FNP, Delorise      Replacing diagnoses that were inactivated after the 06/07/22 regulatory import   GERD (gastroesophageal reflux disease)    Hypercholesteremia    Hypertension    MENOPAUSE, SURGICAL 03/08/2004   Qualifier: Diagnosis of   By: Gladis FNP, Nykedtra       OAB (overactive bladder)    Rheumatoid arthritis (HCC)     Current Outpatient Medications on File Prior to Visit  Medication Sig Dispense Refill   Cholecalciferol (VITAMIN D3) 50 MCG (2000 UT) capsule Take 2,000 Units by mouth daily.     empagliflozin  (JARDIANCE ) 10 MG TABS tablet Take 1 tablet (10 mg total) by mouth daily before breakfast. 90 tablet 3   HYDROcodone -acetaminophen  (NORCO/VICODIN) 5-325 MG tablet Take 1 tablet by mouth 3 (three) times daily as needed for moderate pain (pain score 4-6). 21 tablet 0   losartan -hydrochlorothiazide  (HYZAAR ) 50-12.5 MG tablet Take 1 tablet by mouth daily. 90 tablet 3   metFORMIN  (GLUCOPHAGE ) 500 MG tablet Take 2 tablets (1,000 mg total) by mouth 2 (two) times daily with a meal. 120 tablet 5   ondansetron  (ZOFRAN ) 4 MG tablet Take 1 tablet (4 mg total) by mouth every 8 (eight) hours as needed for nausea or vomiting. 15 tablet 0   pantoprazole  (PROTONIX ) 40 MG tablet Take 1 tablet (40 mg total) by mouth daily as needed. 30 tablet 2   polyethylene glycol (MIRALAX  / GLYCOLAX ) 17 g packet Take 17 g by mouth daily.     rosuvastatin  (CRESTOR ) 20 MG tablet Take 1 tablet (20 mg total) by mouth  daily. 90 tablet 3   No current facility-administered medications on file prior to visit.    Family History  Problem Relation Age of Onset   Hypertension Mother    Diabetes Mother    Cancer Father    Diabetes Brother    Colon cancer Neg Hx    Rectal cancer Neg Hx    Stomach cancer Neg Hx     Social History   Socioeconomic History   Marital status: Single    Spouse name: Not on file   Number of children: 2   Years of education: Not on file   Highest education level: 11th grade  Occupational History   Not on file  Tobacco Use   Smoking status: Never   Smokeless tobacco: Never  Vaping Use   Vaping status: Never Used  Substance and Sexual Activity   Alcohol use: Yes    Alcohol/week: 3.0 standard drinks of alcohol    Types: 3 Cans of beer per week    Comment: social   Drug use: Yes    Frequency: 2.0 times per week    Types: Marijuana    Comment: last smoked 2 wks ago   Sexual activity: Not Currently    Birth control/protection: Surgical    Comment: hyst  Other Topics Concern   Not on file  Social History Narrative   Not on file   Social  Drivers of Corporate investment banker Strain: Not on file  Food Insecurity: Food Insecurity Present (07/05/2022)   Hunger Vital Sign    Worried About Running Out of Food in the Last Year: Sometimes true    Ran Out of Food in the Last Year: Sometimes true  Transportation Needs: No Transportation Needs (06/21/2019)   PRAPARE - Administrator, Civil Service (Medical): No    Lack of Transportation (Non-Medical): No  Physical Activity: Not on file  Stress: Not on file  Social Connections: Socially Isolated (07/05/2022)   Social Connection and Isolation Panel    Frequency of Communication with Friends and Family: More than three times a week    Frequency of Social Gatherings with Friends and Family: Twice a week    Attends Religious Services: Never    Database administrator or Organizations: No    Attends Tax inspector Meetings: Never    Marital Status: Never married  Intimate Partner Violence: Not At Risk (07/05/2022)   Humiliation, Afraid, Rape, and Kick questionnaire    Fear of Current or Ex-Partner: No    Emotionally Abused: No    Physically Abused: No    Sexually Abused: No    Review of Systems: ROS negative except for what is noted on the assessment and plan.  Vitals:   09/26/23 1047  BP: 136/65  Pulse: 70  Temp: 98.2 F (36.8 C)  TempSrc: Oral  SpO2: 100%  Weight: 208 lb 12.8 oz (94.7 kg)  Height: 5' 11 (1.803 m)    Physical Exam: Constitutional: well-appearing woman HENT: normocephalic atraumatic, mucous membranes moist Eyes: conjunctiva non-erythematous Cardiovascular: regular rate and rhythm, no m/r/g Pulmonary/Chest: normal work of breathing on room air, lungs clear to auscultation bilaterally Abdominal: soft, non-tender, non-distended MSK: Upper extremity knuckles are without swelling or nodes Psych: normal mood and behavior  Assessment & Plan:   Upper limb pain, diffuse, unspecified laterality [name] presented to the clinic with concerns of diffuse upper extremity pain affecting both her left and right sides. She reports that the pain worsens in the mornings. The pain is not new and has been intermittent for many years. She also notes pain in her hips and occasionally in her knees. Review of her chart reveals an extensive workup for rheumatoid arthritis, which has been negative to date. This lowers suspicion for RA, and therefore, disease-modifying antirheumatic drugs are not indicated at this time. The patient reports that her pain has been refractory to Tylenol  and ibuprofen . On examination, there is no swelling or deformity noted in the knuckles of her hands bilaterally. It is important to note that she recently underwent carpal tunnel release surgery about a month ago with orthopedics. Given the chronicity and widespread nature of her musculoskeletal pain, I  am concerned about fibromyalgia. After a shared decision-making discussion, I will order repeat X-rays of both hands to assess for any changes from prior imaging. Additionally, I will initiate duloxetine  and monitor her response to treatment. The patient was also advised to take Tylenol  1000 mg three times daily, rather than as needed. If she does not respond adequately to duloxetine , physical therapy will be considered as the next step in her management. - XR hand bilaterally   - Prescribe duloxetine  30 mg daily - Take Tylenol  1000 mg 3 times daily - Avoid NSAIDs if possible - Consider physical therapy in future     Patient discussed with Dr. Karna Drue Grow, M.D [facility]  Internal Medicine Phone: [phone] Date 09/26/2023 Time 5:20 PM

## 2023-09-26 NOTE — Telephone Encounter (Signed)
 FYI Only or Action Required?: FYI only for provider.  Patient was last seen in primary care on 08/22/2023 by Chelsea Gaines, DO.  Called Nurse Triage reporting Pain.  Symptoms began a week ago.  Interventions attempted: OTC medications: Tylenol  arthritis and Rest, hydration, or home remedies.  Symptoms are: unchanged.  Triage Disposition: See HCP Within 4 Hours (Or PCP Triage)  Patient/caregiver understands and will follow disposition?: Yes   Reason for Disposition  [1] SEVERE pain (e.g., excruciating, unable to do any normal activities) AND [2] not improved after 2 hours of pain medicine  Answer Assessment - Initial Assessment Questions 1. ONSET: When did the pain start?      One week 2. LOCATION: Where is the pain located?      Bilateral legs and arms 3. PAIN: How bad is the pain?    (Scale 1-10; or mild, moderate, severe)     Can go up to 10/10  4. WORK OR EXERCISE: Has there been any recent work or exercise that involved this part of the body?       5. CAUSE: What do you think is causing the leg pain?     unsure 6. OTHER SYMPTOMS: Do you have any other symptoms? (e.g., chest pain, back pain, breathing difficulty, swelling, rash, fever, numbness, weakness)     Joint pain  Additional info: Tylenol  arthritis is helpful but only for a short time.  No injuries  Protocols used: Leg Pain-A-AH

## 2023-09-30 NOTE — Progress Notes (Signed)
 Internal Medicine Clinic Attending  Case discussed with the resident at the time of the visit.  We reviewed the resident's history and exam and pertinent patient test results.  I agree with the assessment, diagnosis, and plan of care documented in the resident's note. Prior imaging and evaluation consistent with OA, negative for RA.

## 2023-10-10 ENCOUNTER — Other Ambulatory Visit (HOSPITAL_COMMUNITY): Payer: Self-pay

## 2023-10-10 ENCOUNTER — Encounter: Admitting: Student

## 2023-10-10 NOTE — Assessment & Plan Note (Deleted)
 [name] reports chronic, diffuse upper extremity pain, also involving hips and knees, with symptoms worse in the mornings and unresponsive to Tylenol  or ibuprofen . Prior rheumatologic workup has been negative, lowering suspicion for RA; fibromyalgia is now the leading consideration. She was started on duloxetine , hand X-rays were ordered, and scheduled Tylenol  was recommended, with physical therapy as a possible next step if symptoms persist. X-rays of both hands consistent with degenerative osteoarthritis.  - Prescribe duloxetine  30 mg daily - Take Tylenol  1000 mg 3 times daily - Avoid NSAIDs if possible - Consider physical therapy in future

## 2023-10-10 NOTE — Progress Notes (Deleted)
 CC: Diffuse painful.  HPI:  [name] is a 63 y.o. female living with a history stated below and presents today for follow-up of diffuse pain.  Patient was last seen in resident [facility] 2 weeks ago.  Was started on duloxetine  for fibromyalgia.   Please see problem based assessment and plan for additional details.  Past Medical History:  Diagnosis Date   Acute non-recurrent maxillary sinusitis 03/30/2021   Bilateral carpal tunnel syndrome    Constipation    Diabetes (HCC)    DIVERTICULAR DISEASE 11/26/2009   Annotation: noted in barium enema  Qualifier: Diagnosis of   By: Gladis FNP, Delorise      Replacing diagnoses that were inactivated after the 06/07/22 regulatory import   GERD (gastroesophageal reflux disease)    Hypercholesteremia    Hypertension    MENOPAUSE, SURGICAL 03/08/2004   Qualifier: Diagnosis of   By: Gladis FNP, Nykedtra       OAB (overactive bladder)    Rheumatoid arthritis (HCC)     Current Outpatient Medications on File Prior to Visit  Medication Sig Dispense Refill   Cholecalciferol (VITAMIN D3) 50 MCG (2000 UT) capsule Take 2,000 Units by mouth daily.     DULoxetine  (CYMBALTA ) 30 MG capsule Take 1 capsule (30 mg total) by mouth daily. 30 capsule 1   empagliflozin  (JARDIANCE ) 10 MG TABS tablet Take 1 tablet (10 mg total) by mouth daily before breakfast. 90 tablet 3   HYDROcodone -acetaminophen  (NORCO/VICODIN) 5-325 MG tablet Take 1 tablet by mouth 3 (three) times daily as needed for moderate pain (pain score 4-6). 21 tablet 0   losartan -hydrochlorothiazide  (HYZAAR ) 50-12.5 MG tablet Take 1 tablet by mouth daily. 90 tablet 3   metFORMIN  (GLUCOPHAGE ) 500 MG tablet Take 2 tablets (1,000 mg total) by mouth 2 (two) times daily with a meal. 120 tablet 5   ondansetron  (ZOFRAN ) 4 MG tablet Take 1 tablet (4 mg total) by mouth every 8 (eight) hours as needed for nausea or vomiting. 15 tablet 0   pantoprazole  (PROTONIX ) 40 MG tablet Take 1 tablet (40 mg total) by  mouth daily as needed. 30 tablet 2   polyethylene glycol (MIRALAX  / GLYCOLAX ) 17 g packet Take 17 g by mouth daily.     rosuvastatin  (CRESTOR ) 20 MG tablet Take 1 tablet (20 mg total) by mouth daily. 90 tablet 3   No current facility-administered medications on file prior to visit.    Review of Systems: ROS negative except for what is noted on the assessment and plan.  There were no vitals filed for this visit. {Labs (Optional):23779} {Vitals History (Optional):23777}  Physical Exam: Constitutional: NAD Cardiovascular: RRR, no murmurs. Pulmonary/Chest: Clear bilateral lungs Abdominal: soft, non-tender, non-distended.  Assessment & Plan:   Patient {GC/GE:[id number]::discussed with,seen with} Dr. {WJFZD:[id number]::Tpoopjfd,Z. Hoffman,Chambliss, Winfrey,Lau,Machen}  Assessment & Plan Upper limb pain, diffuse, unspecified laterality [name] reports chronic, diffuse upper extremity pain, also involving hips and knees, with symptoms worse in the mornings and unresponsive to Tylenol  or ibuprofen . Prior rheumatologic workup has been negative, lowering suspicion for RA; fibromyalgia is now the leading consideration. She was started on duloxetine , hand X-rays were ordered, and scheduled Tylenol  was recommended, with physical therapy as a possible next step if symptoms persist. X-rays of both hands consistent with degenerative osteoarthritis.  - Prescribe duloxetine  30 mg daily - Take Tylenol  1000 mg 3 times daily - Avoid NSAIDs if possible - Consider physical therapy in future  No orders of the defined types were placed in this encounter.  Missy Sandhoff, MD [facility] Internal Medicine, PGY-2  Date 10/10/2023 Time 12:21 PM

## 2023-10-11 ENCOUNTER — Other Ambulatory Visit: Payer: Self-pay

## 2023-10-11 ENCOUNTER — Encounter: Admitting: Student

## 2023-10-11 ENCOUNTER — Ambulatory Visit: Admitting: Student

## 2023-10-11 VITALS — BP 139/77 | HR 69 | Temp 99.1°F | Resp 32 | Ht 71.0 in | Wt 207.2 lb

## 2023-10-11 DIAGNOSIS — E669 Obesity, unspecified: Secondary | ICD-10-CM

## 2023-10-11 DIAGNOSIS — E785 Hyperlipidemia, unspecified: Secondary | ICD-10-CM

## 2023-10-11 DIAGNOSIS — E1165 Type 2 diabetes mellitus with hyperglycemia: Secondary | ICD-10-CM

## 2023-10-11 DIAGNOSIS — M79603 Pain in arm, unspecified: Secondary | ICD-10-CM

## 2023-10-11 DIAGNOSIS — Z7984 Long term (current) use of oral hypoglycemic drugs: Secondary | ICD-10-CM

## 2023-10-11 DIAGNOSIS — E1169 Type 2 diabetes mellitus with other specified complication: Secondary | ICD-10-CM | POA: Diagnosis not present

## 2023-10-11 NOTE — Progress Notes (Unsigned)
 CC: Follow-up on diffuse MSK pain and X ray imaging results.  HPI:  [name] is a 63 y.o. female living with a history stated below and presents today for follow-up  on diffuse MSK pain and X ray imaging results.  Patient was last seen in resident [facility] 2 weeks ago.  Was started on duloxetine  for fibromyalgia.  Please see problem based assessment and plan for additional details.  Past Medical History:  Diagnosis Date   Acute non-recurrent maxillary sinusitis 03/30/2021   Bilateral carpal tunnel syndrome    Constipation    Diabetes (HCC)    DIVERTICULAR DISEASE 11/26/2009   Annotation: noted in barium enema  Qualifier: Diagnosis of   By: Gladis FNP, Delorise      Replacing diagnoses that were inactivated after the 06/07/22 regulatory import   GERD (gastroesophageal reflux disease)    Hypercholesteremia    Hypertension    MENOPAUSE, SURGICAL 03/08/2004   Qualifier: Diagnosis of   By: Gladis FNP, Nykedtra       OAB (overactive bladder)    Rheumatoid arthritis (HCC)     Current Outpatient Medications on File Prior to Visit  Medication Sig Dispense Refill   Cholecalciferol (VITAMIN D3) 50 MCG (2000 UT) capsule Take 2,000 Units by mouth daily.     DULoxetine  (CYMBALTA ) 30 MG capsule Take 1 capsule (30 mg total) by mouth daily. 30 capsule 1   empagliflozin  (JARDIANCE ) 10 MG TABS tablet Take 1 tablet (10 mg total) by mouth daily before breakfast. 90 tablet 3   HYDROcodone -acetaminophen  (NORCO/VICODIN) 5-325 MG tablet Take 1 tablet by mouth 3 (three) times daily as needed for moderate pain (pain score 4-6). 21 tablet 0   losartan -hydrochlorothiazide  (HYZAAR ) 50-12.5 MG tablet Take 1 tablet by mouth daily. 90 tablet 3   metFORMIN  (GLUCOPHAGE ) 500 MG tablet Take 2 tablets (1,000 mg total) by mouth 2 (two) times daily with a meal. 120 tablet 5   ondansetron  (ZOFRAN ) 4 MG tablet Take 1 tablet (4 mg total) by mouth every 8 (eight) hours as needed for nausea or vomiting. 15 tablet 0    pantoprazole  (PROTONIX ) 40 MG tablet Take 1 tablet (40 mg total) by mouth daily as needed. 30 tablet 2   polyethylene glycol (MIRALAX  / GLYCOLAX ) 17 g packet Take 17 g by mouth daily.     rosuvastatin  (CRESTOR ) 20 MG tablet Take 1 tablet (20 mg total) by mouth daily. 90 tablet 3   No current facility-administered medications on file prior to visit.    Review of Systems: ROS negative except for what is noted on the assessment and plan.  Vitals:   10/11/23 1443 10/11/23 1449  BP: (!) 150/81 139/77  Pulse: 65 69  Resp: (!) 32   Temp: 99.1 F (37.3 C)   TempSrc: Oral   SpO2: 96%   Weight: 207 lb 3.2 oz (94 kg)   Height: 5' 11 (1.803 m)    Physical Exam: Constitutional: Well-appearing. Cardiovascular: RRR, no murmurs. Pulmonary/Chest: Clear bilateral lungs Abdominal: Obese abdomen, soft, nontender. MSK: Diffuse muscle tenderness, without edema/inflammation of joints.  Assessment & Plan:   Patient discussed with Dr. Dr. Shawn  Assessment & Plan Upper limb pain, diffuse, unspecified laterality Chronic widespread musculoskeletal pain, stiffness, and fatigue consistent with fibromyalgia. Hand X-rays showed degenerative changes consistent with osteoarthritis. - Continue duloxetine  30 mg daily and Tylenol  1000 mg TID for pain management - Referral to physical therapy - Encourage weight loss and regular exercise  Type 2 diabetes mellitus with obesity (HCC)  A1c was 8.0 a month ago. Patient is currently on Jardiance  10 mg and metformin  1000 mg BID. Will increase Jardiance  to 25 mg due to uncontrolled diabetes. If A1c remains elevated at next visit, will consider adding a GLP-1 agonist for glycemic control and weight loss, which may also benefit her osteoarthritis. - A1c check in 2 months - Increase Jardiance  to 25 mg - Continue with metformin  1000 mg twice daily - Referral for yearly eye exam placed. Hyperlipidemia associated with type 2 diabetes mellitus (HCC) LDL remains above  goal. Patient is currently on Crestor  20 mg. Will repeat lipid panel; if still elevated, plan to increase Crestor  to 40 mg.  Orders Placed This Encounter  Procedures   Lipid Profile   HM Diabetes Eye Exam    Missy Sandhoff, MD [facility] Internal Medicine, PGY-2  Date 10/11/2023 Time 3:39 PM

## 2023-10-11 NOTE — Assessment & Plan Note (Signed)
 Right: Minimal degenerative changes of the right first [facility] joint. Left: Moderate degenerative osteoarthritis of the left first [facility] joint at the wrist.  Any hip/knee pain.  [name] reports chronic, diffuse upper extremity pain, also involving hips and knees, with symptoms worse in the mornings and unresponsive to Tylenol  or ibuprofen . Prior rheumatologic workup has been negative, lowering suspicion for RA; fibromyalgia is now the leading consideration. She was started on duloxetine , hand X-rays were ordered, and scheduled Tylenol  was recommended, with physical therapy as a possible next step if symptoms persist. X-rays of both hands consistent with degenerative osteoarthritis.  - Prescribe duloxetine  30 mg daily - Take Tylenol  1000 mg 3 times daily - Weight loss and exercise - Therapy.

## 2023-10-11 NOTE — Patient Instructions (Addendum)
 It was a pleasure taking care of you today!    I strongly recommend you get the pneumococcal vaccine. You can get it from the pharmacy.  2. I have placed a referral for you to see an eye Dr.  3. Continue to exercise and lose weight, this will improve the pain.   I have ordered the following labs for you:  Lab Orders  No laboratory test(s) ordered today      Follow up: 2-3 months   Should you have any questions or concerns please call the internal medicine clinic at [phone].     Missy Sandhoff, MD  [facility]

## 2023-10-12 ENCOUNTER — Other Ambulatory Visit (HOSPITAL_COMMUNITY): Payer: Self-pay

## 2023-10-12 ENCOUNTER — Encounter: Payer: Self-pay | Admitting: Student

## 2023-10-12 LAB — LIPID PANEL
Chol/HDL Ratio: 2.3 ratio (ref 0.0–4.4)
Cholesterol, Total: 150 mg/dL (ref 100–199)
HDL: 65 mg/dL (ref >39)
LDL Chol Calc (NIH): 58 mg/dL (ref 0–99)
Triglycerides: 161 mg/dL — ABNORMAL HIGH (ref 0–149)
VLDL Cholesterol Cal: 27 mg/dL (ref 5–40)

## 2023-10-12 MED ORDER — EMPAGLIFLOZIN 25 MG PO TABS
25.0000 mg | ORAL_TABLET | Freq: Every day | ORAL | 3 refills | Status: DC
  Filled 2023-10-12: qty 30, 30d supply, fill #0
  Filled 2023-11-07: qty 30, 30d supply, fill #1
  Filled 2023-12-12: qty 30, 30d supply, fill #2
  Filled 2024-01-17: qty 30, 30d supply, fill #3

## 2023-10-12 NOTE — Addendum Note (Signed)
 Addended by: Persais Ethridge on: 10/12/2023 01:41 PM   Modules accepted: Level of Service

## 2023-10-12 NOTE — Assessment & Plan Note (Signed)
 LDL remains above goal. Patient is currently on Crestor  20 mg. Will repeat lipid panel; if still elevated, plan to increase Crestor  to 40 mg.

## 2023-10-12 NOTE — Assessment & Plan Note (Addendum)
 A1c was 8.0 a month ago. Patient is currently on Jardiance  10 mg and metformin  1000 mg BID. Will increase Jardiance  to 25 mg due to uncontrolled diabetes. If A1c remains elevated at next visit, will consider adding a GLP-1 agonist for glycemic control and weight loss, which may also benefit her osteoarthritis. - A1c check in 2 months - Increase Jardiance  to 25 mg - Continue with metformin  1000 mg twice daily - Referral for yearly eye exam placed.

## 2023-10-12 NOTE — Progress Notes (Signed)
 Internal Medicine Clinic Attending  Case discussed with the resident at the time of the visit.  We reviewed the resident's history and exam and pertinent patient test results.  I agree with the assessment, diagnosis, and plan of care documented in the resident's note.

## 2023-10-13 ENCOUNTER — Ambulatory Visit: Payer: Self-pay | Admitting: Student

## 2023-10-13 NOTE — Progress Notes (Signed)
 Lipid panel with normal LDL.  Patient notified.

## 2023-10-19 ENCOUNTER — Other Ambulatory Visit: Payer: Self-pay | Admitting: Student

## 2023-10-19 DIAGNOSIS — K219 Gastro-esophageal reflux disease without esophagitis: Secondary | ICD-10-CM

## 2023-10-19 NOTE — Telephone Encounter (Signed)
 Copied from CRM [id number]. Topic: Clinical - Medication Refill >> Oct 19, 2023  1:14 PM Adrianna P wrote: Medication: pantoprazole  (PROTONIX ) 40 MG tablet  Has the patient contacted their pharmacy? No (Agent: If no, request that the patient contact the pharmacy for the refill. If patient does not wish to contact the pharmacy document the reason why and proceed with request.) (Agent: If yes, when and what did the pharmacy advise?)  This is the patient's preferred pharmacy:  [facility] [address] [place] [address] Phone: [phone] Fax: [phone]    Is this the correct pharmacy for this prescription? Yes If no, delete pharmacy and type the correct one.   Has the prescription been filled recently? No  Is the patient out of the medication? Yes  Has the patient been seen for an appointment in the last year OR does the patient have an upcoming appointment? Yes  Can we respond through MyChart? No  Agent: Please be advised that Rx refills may take up to 3 business days. We ask that you follow-up with your pharmacy.

## 2023-10-20 ENCOUNTER — Other Ambulatory Visit (HOSPITAL_COMMUNITY): Payer: Self-pay

## 2023-10-20 MED ORDER — PANTOPRAZOLE SODIUM 40 MG PO TBEC
40.0000 mg | DELAYED_RELEASE_TABLET | Freq: Every day | ORAL | 2 refills | Status: DC | PRN
Start: 2023-10-20 — End: 2023-11-28
  Filled 2023-10-20: qty 30, 30d supply, fill #0

## 2023-10-25 ENCOUNTER — Telehealth: Payer: Self-pay

## 2023-10-25 NOTE — Telephone Encounter (Signed)
 Returned call. Explained PREP she prefers [facility], needs M/W after 1:00, contact when classes available

## 2023-10-31 ENCOUNTER — Other Ambulatory Visit (HOSPITAL_COMMUNITY): Payer: Self-pay

## 2023-11-09 ENCOUNTER — Other Ambulatory Visit (HOSPITAL_COMMUNITY): Payer: Self-pay

## 2023-11-11 ENCOUNTER — Other Ambulatory Visit (HOSPITAL_COMMUNITY): Payer: Self-pay

## 2023-11-14 ENCOUNTER — Other Ambulatory Visit (HOSPITAL_COMMUNITY): Payer: Self-pay

## 2023-11-28 ENCOUNTER — Encounter: Payer: Self-pay | Admitting: Student

## 2023-11-28 ENCOUNTER — Other Ambulatory Visit (HOSPITAL_COMMUNITY): Payer: Self-pay

## 2023-11-28 ENCOUNTER — Ambulatory Visit: Admitting: Student

## 2023-11-28 VITALS — BP 136/79 | HR 75 | Temp 98.1°F | Ht 71.0 in | Wt 204.8 lb

## 2023-11-28 DIAGNOSIS — Z6828 Body mass index (BMI) 28.0-28.9, adult: Secondary | ICD-10-CM

## 2023-11-28 DIAGNOSIS — E669 Obesity, unspecified: Secondary | ICD-10-CM

## 2023-11-28 DIAGNOSIS — K219 Gastro-esophageal reflux disease without esophagitis: Secondary | ICD-10-CM

## 2023-11-28 DIAGNOSIS — Z833 Family history of diabetes mellitus: Secondary | ICD-10-CM

## 2023-11-28 DIAGNOSIS — E1169 Type 2 diabetes mellitus with other specified complication: Secondary | ICD-10-CM

## 2023-11-28 DIAGNOSIS — I10 Essential (primary) hypertension: Secondary | ICD-10-CM | POA: Diagnosis not present

## 2023-11-28 DIAGNOSIS — Z7984 Long term (current) use of oral hypoglycemic drugs: Secondary | ICD-10-CM

## 2023-11-28 DIAGNOSIS — Z79899 Other long term (current) drug therapy: Secondary | ICD-10-CM

## 2023-11-28 DIAGNOSIS — Z8249 Family history of ischemic heart disease and other diseases of the circulatory system: Secondary | ICD-10-CM

## 2023-11-28 DIAGNOSIS — M19041 Primary osteoarthritis, right hand: Secondary | ICD-10-CM

## 2023-11-28 DIAGNOSIS — M19042 Primary osteoarthritis, left hand: Secondary | ICD-10-CM

## 2023-11-28 DIAGNOSIS — Z7185 Encounter for immunization safety counseling: Secondary | ICD-10-CM | POA: Insufficient documentation

## 2023-11-28 LAB — POCT GLYCOSYLATED HEMOGLOBIN (HGB A1C): HbA1c, POC (controlled diabetic range): 7.3 % — AB (ref 0.0–7.0)

## 2023-11-28 LAB — GLUCOSE, CAPILLARY: Glucose-Capillary: 138 mg/dL — ABNORMAL HIGH (ref 70–99)

## 2023-11-28 MED ORDER — PANTOPRAZOLE SODIUM 40 MG PO TBEC
40.0000 mg | DELAYED_RELEASE_TABLET | Freq: Every day | ORAL | 6 refills | Status: AC | PRN
  Filled 2023-11-28: qty 30, 30d supply, fill #0
  Filled 2024-01-17: qty 30, 30d supply, fill #1
  Filled 2024-04-03: qty 30, 30d supply, fill #2

## 2023-11-28 MED ORDER — DULOXETINE HCL 30 MG PO CPEP
30.0000 mg | ORAL_CAPSULE | Freq: Every day | ORAL | 5 refills | Status: AC
  Filled 2023-11-28: qty 30, 30d supply, fill #0
  Filled 2024-02-27: qty 30, 30d supply, fill #1

## 2023-11-28 MED ORDER — DICLOFENAC SODIUM 1 % EX GEL
4.0000 g | Freq: Four times a day (QID) | CUTANEOUS | 2 refills | Status: AC
  Filled 2023-11-28: qty 100, 10d supply, fill #0

## 2023-11-28 NOTE — Assessment & Plan Note (Signed)
 Patient was contemplating getting flu vaccine and pneumococcal vaccine.  She agreed, but that left the clinic prior to getting vaccination.  Attempted to call patient, but no answer.  Will send MyChart message.

## 2023-11-28 NOTE — Patient Instructions (Signed)
 [name],Thank you for allowing me to take part in your care today.  Here are your instructions.  1. I am going to send the Cymbalta  in for you. Please take this daily this will help you with your chronic pain. I sent a Gel in for you to help with the pain.   2. Please call the physical therapist to get schedule [facility] Physical therapist in [place], [place]  Address: [address] [place], [place], [place] [address] Phone: [phone]  3. Your A1c is close to goal. The goal is less than 7. You are at 7.3. Try to eat well and exercise. This will help you get below 7.0.   PLEASE BRING YOUR MEDICATIONS TO EVERY APPOINTMENT  Thank you, Dr. Tobie  If you have any other questions please contact the internal medicine clinic at [phone] If it is after hours, please call the [facility] at [phone] and then ask the person who picks up for the resident on call.

## 2023-11-28 NOTE — Assessment & Plan Note (Signed)
 No acute concerns at this time.  Refill Protonix .  Plan: - Continue Protonix  40 mg daily -Trial off in 6 months

## 2023-11-28 NOTE — Assessment & Plan Note (Signed)
 Patient has past medical history of hypertension.  Blood pressure today was elevated at 131/74.  Recheck was 136/79.  Current medication includes losartan -HCTZ 50-12.5 mg daily.  Instructed patient to keep blood pressure log and bring back in next visit.  Plan: - Blood pressure log at next visit - Continue losartan -HCTZ 50-12.5 mg daily

## 2023-11-28 NOTE — Assessment & Plan Note (Signed)
 Patient has a past medical history of diabetes.  Her current medications include Jardiance  25 mg daily and metformin  1000 mg twice daily.  A1c today is 7.3, down from 8.0 on June 2025.  Congratulated her on this.  Instructed that she needs a little bit more medicine to bring down to less than 7.  Offered GLP-1.  She declined.  She states she would like to try lifestyle medications first.  Plan: - Continue Jardiance  25 mg daily - Continue metformin  1000 mg twice daily - Foot exam updated - Return in 3 months for A1c check

## 2023-11-28 NOTE — Assessment & Plan Note (Addendum)
 Continues to have hand pain.  Will reach out to her physical therapist.  Wrote her Voltaren  gel.  She is also going to go to her [employer] prep class.  Hopefully this pain improves with time.  She was to try Cymbalta , but this away because she felt was not helping.  Counseled her on the importance of staying adherent to her medications.  She agreed to restart Cymbalta .  Plan: - Continue Cymbalta  30 mg daily - Voltaren  gel - Physical therapy

## 2023-11-28 NOTE — Progress Notes (Signed)
 CC: Diabetes follow-up  HPI:  [name] is a 63 y.o. female with a past medical history of hypertension, GERD, type 2 diabetes, osteoarthritis who presents for follow-up appointment.  Please see assessment and plan for full HPI.  Medications: Diabetes: Jardiance  25 mg daily, metformin  1000 mg twice daily Hyperlipidemia: Crestor  20 mg daily GERD: Protonix  40 mg daily Hypertension: Losartan -HCTZ 50-12.5 mg daily Chronic pain: Cymbalta  30 mg daily, Voltaren  gel  Past Medical History:  Diagnosis Date   Acute non-recurrent maxillary sinusitis 03/30/2021   Bilateral carpal tunnel syndrome    Constipation    Diabetes (HCC)    DIVERTICULAR DISEASE 11/26/2009   Annotation: noted in barium enema  Qualifier: Diagnosis of   By: [name]      Replacing diagnoses that were inactivated after the 06/07/22 regulatory import   GERD (gastroesophageal reflux disease)    Hypercholesteremia    Hypertension    MENOPAUSE, SURGICAL 03/08/2004   Qualifier: Diagnosis of   By: Gladis FNP, [name]       OAB (overactive bladder)    Rheumatoid arthritis (HCC)      Current Outpatient Medications:    diclofenac  Sodium (VOLTAREN ) 1 % GEL, Apply 4 grams topically 4 (four) times daily., Disp: 100 g, Rfl: 2   Cholecalciferol (VITAMIN D3) 50 MCG (2000 UT) capsule, Take 2,000 Units by mouth daily., Disp: , Rfl:    DULoxetine  (CYMBALTA ) 30 MG capsule, Take 1 capsule (30 mg total) by mouth daily., Disp: 30 capsule, Rfl: 5   empagliflozin  (JARDIANCE ) 25 MG TABS tablet, Take 1 tablet (25 mg total) by mouth daily before breakfast., Disp: 30 tablet, Rfl: 3   losartan -hydrochlorothiazide  (HYZAAR ) 50-12.5 MG tablet, Take 1 tablet by mouth daily., Disp: 90 tablet, Rfl: 3   metFORMIN  (GLUCOPHAGE ) 500 MG tablet, Take 2 tablets (1,000 mg total) by mouth 2 (two) times daily with a meal., Disp: 120 tablet, Rfl: 5   pantoprazole  (PROTONIX ) 40 MG tablet, Take 1 tablet (40 mg total) by mouth daily as needed.,  Disp: 30 tablet, Rfl: 6   polyethylene glycol (MIRALAX  / GLYCOLAX ) 17 g packet, Take 17 g by mouth daily., Disp: , Rfl:    rosuvastatin  (CRESTOR ) 20 MG tablet, Take 1 tablet (20 mg total) by mouth daily., Disp: 90 tablet, Rfl: 3  Review of Systems:    MSK: Patient endorses hand pain  Physical Exam:  Vitals:   11/28/23 1028 11/28/23 1120  BP: 131/74 136/79  Pulse: 70 75  Temp: 98.1 F (36.7 C)   TempSrc: Oral   SpO2: 100%   Weight: 204 lb 12.8 oz (92.9 kg)   Height: 5' 11 (1.803 m)    General: Patient is sitting comfortably in the room  Head: Normocephalic, atraumatic  Cardio: Regular rate and rhythm, no murmurs, rubs or gallops Pulmonary: Clear to ausculation bilaterally with no rales, rhonchi, and crackles    Assessment & Plan:   Essential hypertension Patient has past medical history of hypertension.  Blood pressure today was elevated at 131/74.  Recheck was 136/79.  Current medication includes losartan -HCTZ 50-12.5 mg daily.  Instructed patient to keep blood pressure log and bring back in next visit.  Plan: - Blood pressure log at next visit - Continue losartan -HCTZ 50-12.5 mg daily  GERD No acute concerns at this time.  Refill Protonix .  Plan: - Continue Protonix  40 mg daily -Trial off in 6 months  Type 2 diabetes mellitus with obesity (HCC) Patient has a past medical history of diabetes.  Her  current medications include Jardiance  25 mg daily and metformin  1000 mg twice daily.  A1c today is 7.3, down from 8.0 on June 2025.  Congratulated her on this.  Instructed that she needs a little bit more medicine to bring down to less than 7.  Offered GLP-1.  She declined.  She states she would like to try lifestyle medications first.  Plan: - Continue Jardiance  25 mg daily - Continue metformin  1000 mg twice daily - Foot exam updated - Return in 3 months for A1c check  Vaccine counseling Patient was contemplating getting flu vaccine and pneumococcal vaccine.  She  agreed, but that left the clinic prior to getting vaccination.  Attempted to call patient, but no answer.  Will send MyChart message.  Arthritis of both hands Continues to have hand pain.  Will reach out to her physical therapist.  Wrote her Voltaren  gel.  She is also going to go to her [employer] prep class.  Hopefully this pain improves with time.  She was to try Cymbalta , but this away because she felt was not helping.  Counseled her on the importance of staying adherent to her medications.  She agreed to restart Cymbalta .  Plan: - Continue Cymbalta  30 mg daily - Voltaren  gel - Physical therapy  Patient discussed with Dr. Mliss Foot  Libby Blanch, DO Internal Medicine Resident PGY-3

## 2023-11-29 NOTE — Progress Notes (Signed)
 Internal Medicine Clinic Attending  Case discussed with the resident at the time of the visit.  We reviewed the resident's history and exam and pertinent patient test results.  I agree with the assessment, diagnosis, and plan of care documented in the resident's note.

## 2023-12-10 ENCOUNTER — Other Ambulatory Visit: Payer: Self-pay

## 2023-12-10 ENCOUNTER — Encounter (HOSPITAL_COMMUNITY): Payer: Self-pay | Admitting: *Deleted

## 2023-12-10 ENCOUNTER — Ambulatory Visit (HOSPITAL_COMMUNITY)
Admission: EM | Admit: 2023-12-10 | Discharge: 2023-12-10 | Disposition: A | Discharge status: Home / Self Care | Attending: Internal Medicine | Admitting: Internal Medicine

## 2023-12-10 DIAGNOSIS — J0110 Acute frontal sinusitis, unspecified: Secondary | ICD-10-CM

## 2023-12-10 MED ORDER — PROMETHAZINE-DM 6.25-15 MG/5ML PO SYRP: 5.0000 mL | ORAL_SOLUTION | Freq: Three times a day (TID) | ORAL | 0 refills | Status: AC | PRN

## 2023-12-10 MED ORDER — AMOXICILLIN 875 MG PO TABS: 875.0000 mg | ORAL_TABLET | Freq: Two times a day (BID) | ORAL | 0 refills | Status: AC

## 2023-12-10 MED ORDER — PREDNISONE 20 MG PO TABS: 40.0000 mg | ORAL_TABLET | Freq: Every day | ORAL | 0 refills | Status: AC

## 2023-12-10 NOTE — ED Provider Notes (Signed)
 [facility]    CSN: [id number] Arrival date & time: 12/10/23  1418      History   Chief Complaint Chief Complaint  Patient presents with   Nasal Congestion   Cough    HPI [name] is a 63 y.o. female.   63 year old female presents urgent care with complaints of sinus congestion, sinus pressure, sinus pain, cough, ear pain.  Her symptoms started at least a week ago but have progressively gotten worse.  She reports that now her head feels completely full.  She is especially having pain right over her eyes.  She denies any visual changes.  She has not had any fevers or chills, nausea or vomiting.  She has been taking over-the-counter cold and flu medication without much relief.   Cough Associated symptoms: ear pain, headaches and rhinorrhea   Associated symptoms: no chest pain, no chills, no fever, no rash, no shortness of breath and no sore throat     Past Medical History:  Diagnosis Date   Acute non-recurrent maxillary sinusitis 03/30/2021   Bilateral carpal tunnel syndrome    Constipation    Diabetes (HCC)    DIVERTICULAR DISEASE 11/26/2009   Annotation: noted in barium enema  Qualifier: Diagnosis of   By: Gladis FNP, Delorise      Replacing diagnoses that were inactivated after the 06/07/22 regulatory import   GERD (gastroesophageal reflux disease)    Hypercholesteremia    Hypertension    MENOPAUSE, SURGICAL 03/08/2004   Qualifier: Diagnosis of   By: Gladis FNP, Nykedtra       OAB (overactive bladder)    Rheumatoid arthritis [facility])     Patient Active Problem List   Diagnosis Date Noted   Vaccine counseling 11/28/2023   Arthritis of both hands 09/26/2023   Right Back pain, CVA tenderness 06/16/2023   Carpal tunnel syndrome on left 03/22/2023   Osteoarthritis of ankle and foot 05/03/2022   Type 2 diabetes mellitus with hyperglycemia, without long-term current use of insulin (HCC) 03/30/2021   Type 2 diabetes mellitus with obesity 03/30/2021    Rheumatoid factor positive 12/10/2016   Chronic arthralgias of knees and hips 12/10/2016   Allergic rhinitis 10/16/2009   Constipation 03/11/2009   Essential hypertension 02/19/2009   GERD 02/19/2009    Past Surgical History:  Procedure Laterality Date   ABDOMINAL HYSTERECTOMY     ANKLE FRACTURE SURGERY Left    CARPAL TUNNEL RELEASE Left 08/17/2023   Procedure: CARPAL TUNNEL RELEASE;  Surgeon: Jerri Kay HERO, MD;  Location: [facility];  Service: Orthopedics;  Laterality: Left;    OB History   No obstetric history on file.      Home Medications    Prior to Admission medications   Medication Sig Start Date End Date Taking? Authorizing Provider  amoxicillin  (AMOXIL ) 875 MG tablet Take 1 tablet (875 mg total) by mouth 2 (two) times daily for 7 days. 12/10/23 12/17/23 Yes Yaritzi Craun A, PA-C  DULoxetine  (CYMBALTA ) 30 MG capsule Take 1 capsule (30 mg total) by mouth daily. 11/28/23 11/27/24 Yes Tobie Gaines, DO  empagliflozin  (JARDIANCE ) 25 MG TABS tablet Take 1 tablet (25 mg total) by mouth daily before breakfast. 10/12/23  Yes Celestina Czar, MD  losartan -hydrochlorothiazide  (HYZAAR ) 50-12.5 MG tablet Take 1 tablet by mouth daily. 05/23/23  Yes Masters, Katie, DO  metFORMIN  (GLUCOPHAGE ) 500 MG tablet Take 2 tablets (1,000 mg total) by mouth 2 (two) times daily with a meal. 08/22/23 08/21/24 Yes Tobie Gaines, DO  pantoprazole  (  PROTONIX ) 40 MG tablet Take 1 tablet (40 mg total) by mouth daily as needed. 11/28/23  Yes Tobie Gaines, DO  predniSONE  (DELTASONE ) 20 MG tablet Take 2 tablets (40 mg total) by mouth daily with breakfast for 5 days. 12/10/23 12/15/23 Yes Daffney Greenly A, PA-C  promethazine-dextromethorphan (PROMETHAZINE-DM) 6.25-15 MG/5ML syrup Take 5 mLs by mouth every 8 (eight) hours as needed for cough. 12/10/23  Yes Jaydrien Wassenaar A, PA-C  rosuvastatin  (CRESTOR ) 20 MG tablet Take 1 tablet (20 mg total) by mouth daily. 05/23/23  Yes Masters, Katie, DO  Cholecalciferol  (VITAMIN D3) 50 MCG (2000 UT) capsule Take 2,000 Units by mouth daily.    [provider]  diclofenac  Sodium (VOLTAREN ) 1 % GEL Apply 4 grams topically 4 (four) times daily. 11/28/23   Tobie Gaines, DO  polyethylene glycol (MIRALAX  / GLYCOLAX ) 17 g packet Take 17 g by mouth daily.    [provider]    Family History Family History  Problem Relation Age of Onset   Hypertension Mother    Diabetes Mother    Cancer Father    Diabetes Brother    Colon cancer Neg Hx    Rectal cancer Neg Hx    Stomach cancer Neg Hx     Social History Social History   Tobacco Use   Smoking status: Former    Types: Cigarettes   Smokeless tobacco: Never  Vaping Use   Vaping status: Never Used  Substance Use Topics   Alcohol use: Not Currently   Drug use: Not Currently    Types: Marijuana    Comment: very rare     Allergies   Patient has no known allergies.   Review of Systems Review of Systems  Constitutional:  Negative for chills and fever.  HENT:  Positive for congestion, ear pain, postnasal drip, rhinorrhea, sinus pressure and sinus pain. Negative for sore throat.   Eyes:  Negative for pain and visual disturbance.  Respiratory:  Positive for cough. Negative for shortness of breath.   Cardiovascular:  Negative for chest pain and palpitations.  Gastrointestinal:  Negative for abdominal pain and vomiting.  Genitourinary:  Negative for dysuria and hematuria.  Musculoskeletal:  Negative for arthralgias and back pain.  Skin:  Negative for color change and rash.  Neurological:  Positive for headaches. Negative for seizures and syncope.  All other systems reviewed and are negative.    Physical Exam Triage Vital Signs ED Triage Vitals  Encounter Vitals Group     BP 12/10/23 1512 (!) 150/84     Girls Systolic BP Percentile --      Girls Diastolic BP Percentile --      Boys Systolic BP Percentile --      Boys Diastolic BP Percentile --      Pulse Rate 12/10/23 1512 64      Resp 12/10/23 1512 18     Temp 12/10/23 1512 98.8 F (37.1 C)     Temp Source 12/10/23 1512 Oral     SpO2 12/10/23 1512 95 %     Weight --      Height --      Head Circumference --      Peak Flow --      Pain Score 12/10/23 1513 8     Pain Loc --      Pain Education --      Exclude from Growth Chart --    No data found.  Updated Vital Signs BP (!) 150/84   Pulse 64  Temp 98.8 F (37.1 C) (Oral)   Resp 18   SpO2 95%   Visual Acuity Right Eye Distance:   Left Eye Distance:   Bilateral Distance:    Right Eye Near:   Left Eye Near:    Bilateral Near:     Physical Exam Vitals and nursing note reviewed.  Constitutional:      General: She is not in acute distress.    Appearance: She is well-developed.  HENT:     Head: Normocephalic and atraumatic.     Right Ear: Tympanic membrane normal.     Left Ear: Tympanic membrane normal.     Nose: Nasal tenderness, mucosal edema, congestion and rhinorrhea present. Rhinorrhea is purulent.     Right Sinus: Frontal sinus tenderness present.     Left Sinus: Frontal sinus tenderness present.  Eyes:     Conjunctiva/sclera: Conjunctivae normal.  Cardiovascular:     Rate and Rhythm: Normal rate and regular rhythm.     Heart sounds: No murmur heard. Pulmonary:     Effort: Pulmonary effort is normal. No respiratory distress.     Breath sounds: Normal breath sounds. No wheezing or rhonchi.  Abdominal:     Palpations: Abdomen is soft.     Tenderness: There is no abdominal tenderness.  Musculoskeletal:        General: No swelling.     Cervical back: Neck supple.  Skin:    General: Skin is warm and dry.     Capillary Refill: Capillary refill takes less than 2 seconds.  Neurological:     Mental Status: She is alert.  Psychiatric:        Mood and Affect: Mood normal.      UC Treatments / Results  Labs (all labs ordered are listed, but only abnormal results are displayed) Labs Reviewed - No data to  display  EKG   Radiology No results found.  Procedures Procedures (including critical care time)  Medications Ordered in UC Medications - No data to display  Initial Impression / Assessment and Plan / UC Course  I have reviewed the triage vital signs and the nursing notes.  Pertinent labs & imaging results that were available during my care of the patient were reviewed by me and considered in my medical decision making (see chart for details).     Acute non-recurrent frontal sinusitis  Symptoms and physical exam findings are consistent with acute frontal sinusitis.  This has been going on for at least a week now.  We will treat this with antibiotics by mouth as well as medication to help with inflammation and cough.  We will treat with the following: Amoxicillin  875 mg twice daily for 7 days.  This is an antibiotic.  Take this with food. Prednisone  40 mg (2 tablets) once daily for 5 days. Take this in the morning.  This is a steroid to help with inflammation and pain.  Make sure to monitor your blood sugars closely as there can be a spike in blood sugars while on this medication.  Do not take ibuprofen  or Aleve  while you are on this medication.  You may take Tylenol . Promethazine DM 5 mL every 8 hours as needed for cough.  Use caution as this medication can cause drowsiness. Make sure to stay hydrated by drinking plenty of water. Return to urgent care or PCP if symptoms worsen or fail to resolve.      Final Clinical Impressions(s) / UC Diagnoses   Final diagnoses:  Acute non-recurrent  frontal sinusitis     Discharge Instructions      Symptoms and physical exam findings are consistent with acute frontal sinusitis.  This has been going on for at least a week now.  We will treat this with antibiotics by mouth as well as medication to help with inflammation and cough.  We will treat with the following: Amoxicillin  875 mg twice daily for 7 days.  This is an antibiotic.  Take this  with food. Prednisone  40 mg (2 tablets) once daily for 5 days. Take this in the morning.  This is a steroid to help with inflammation and pain.  Make sure to monitor your blood sugars closely as there can be a spike in blood sugars while on this medication.  Do not take ibuprofen  or Aleve  while you are on this medication.  You may take Tylenol . Promethazine DM 5 mL every 8 hours as needed for cough.  Use caution as this medication can cause drowsiness. Make sure to stay hydrated by drinking plenty of water. Return to urgent care or PCP if symptoms worsen or fail to resolve.        ED Prescriptions     Medication Sig Dispense Auth. Provider   amoxicillin  (AMOXIL ) 875 MG tablet Take 1 tablet (875 mg total) by mouth 2 (two) times daily for 7 days. 14 tablet Torria Fromer A, PA-C   predniSONE  (DELTASONE ) 20 MG tablet Take 2 tablets (40 mg total) by mouth daily with breakfast for 5 days. 10 tablet Teresa Almarie LABOR, PA-C   promethazine-dextromethorphan (PROMETHAZINE-DM) 6.25-15 MG/5ML syrup Take 5 mLs by mouth every 8 (eight) hours as needed for cough. 180 mL Teresa Almarie LABOR, [place]      PDMP not reviewed this encounter.   Teresa Almarie LABOR, [place] 12/10/23 1536

## 2023-12-10 NOTE — ED Triage Notes (Signed)
 C/O rhinorrhea, nasal congestion, head pressure, cough, chest congestion onset approx 1 wk. Unsure if fevers. Has been taking Nyquil, and other OTC meds, without relief.

## 2023-12-10 NOTE — Discharge Instructions (Addendum)
 Symptoms and physical exam findings are consistent with acute frontal sinusitis.  This has been going on for at least a week now.  We will treat this with antibiotics by mouth as well as medication to help with inflammation and cough.  We will treat with the following: Amoxicillin  875 mg twice daily for 7 days.  This is an antibiotic.  Take this with food. Prednisone  40 mg (2 tablets) once daily for 5 days. Take this in the morning.  This is a steroid to help with inflammation and pain.  Make sure to monitor your blood sugars closely as there can be a spike in blood sugars while on this medication.  Do not take ibuprofen  or Aleve  while you are on this medication.  You may take Tylenol . Promethazine DM 5 mL every 8 hours as needed for cough.  Use caution as this medication can cause drowsiness. Make sure to stay hydrated by drinking plenty of water. Return to urgent care or PCP if symptoms worsen or fail to resolve.

## 2023-12-19 ENCOUNTER — Ambulatory Visit

## 2023-12-26 ENCOUNTER — Telehealth: Payer: Self-pay

## 2023-12-26 NOTE — Telephone Encounter (Signed)
 Called to talk about next class at [place], wants [place] location. We will call her again when the next class at [place] starts.

## 2024-01-09 ENCOUNTER — Encounter: Payer: Self-pay | Admitting: Radiology

## 2024-01-17 ENCOUNTER — Telehealth: Payer: Self-pay

## 2024-01-17 NOTE — Telephone Encounter (Signed)
 Left a message about the upcoming PREP class at [facility] to see if she wants to join. Asked her to return my call.

## 2024-01-19 ENCOUNTER — Other Ambulatory Visit (HOSPITAL_COMMUNITY): Payer: Self-pay

## 2024-02-21 ENCOUNTER — Telehealth: Payer: Self-pay

## 2024-02-21 NOTE — Telephone Encounter (Signed)
 Call to talk about [place] [facility] no answer left a VM

## 2024-02-27 ENCOUNTER — Other Ambulatory Visit: Payer: Self-pay | Admitting: Student

## 2024-02-27 ENCOUNTER — Other Ambulatory Visit (HOSPITAL_COMMUNITY): Payer: Self-pay

## 2024-02-27 MED ORDER — EMPAGLIFLOZIN 25 MG PO TABS
25.0000 mg | ORAL_TABLET | Freq: Every day | ORAL | 3 refills | Status: AC
  Filled 2024-02-27: qty 30, 30d supply, fill #0
  Filled 2024-04-03: qty 30, 30d supply, fill #1

## 2024-02-27 NOTE — Telephone Encounter (Signed)
 Medication sent to pharmacy

## 2024-02-28 ENCOUNTER — Other Ambulatory Visit (HOSPITAL_COMMUNITY): Payer: Self-pay

## 2024-03-24 ENCOUNTER — Ambulatory Visit (HOSPITAL_COMMUNITY)
Admission: EM | Admit: 2024-03-24 | Discharge: 2024-03-24 | Disposition: A | Discharge status: Home / Self Care | Attending: Emergency Medicine | Admitting: Emergency Medicine

## 2024-03-24 ENCOUNTER — Encounter (HOSPITAL_COMMUNITY): Payer: Self-pay | Admitting: *Deleted

## 2024-03-24 DIAGNOSIS — S39012A Strain of muscle, fascia and tendon of lower back, initial encounter: Secondary | ICD-10-CM

## 2024-03-24 MED ORDER — METHOCARBAMOL 500 MG PO TABS: 500.0000 mg | ORAL_TABLET | Freq: Two times a day (BID) | ORAL | 0 refills | Status: AC

## 2024-03-24 NOTE — Discharge Instructions (Addendum)
 Your symptoms are consistent with muscle pain / strain  Take the muscle relaxer twice daily as needed to help with the pain, this medication can cause drowsiness Continue to heat and gently stretch the area to loosen, can do topical Voltaren  gel up to 4 times daily as well  Symptoms should improve over the next week, if no improvement or any changes return to clinic or follow-up with orthopedic

## 2024-03-24 NOTE — ED Provider Notes (Signed)
 " [facility]    CSN: [id number] Arrival date & time: 03/24/24  0827      History   Chief Complaint Chief Complaint  Patient presents with   Back Pain    HPI CONSANDRA LASKE is a 64 y.o. female.   Patient presents to clinic over concern of diffuse back pain for the past week   Patient reports upper, middle and lower back pain  She is working two days a week, worked last week Sunday and Monday  Felt like her back is going to go out on her at work She is prepping, doing a lot of stirring, making ranch dressing Tried heating pad w/o releif, BC powder did not help (reports she is not supposed to take this) Denies numbness or tingling, denies falls or injuries Pain w/ bending and twisting     The history is provided by the patient and medical records.  Back Pain   Past Medical History:  Diagnosis Date   Acute non-recurrent maxillary sinusitis 03/30/2021   Bilateral carpal tunnel syndrome    Constipation    Diabetes (HCC)    DIVERTICULAR DISEASE 11/26/2009   Annotation: noted in barium enema  Qualifier: Diagnosis of   By: Gladis FNP, Nykedtra      Replacing diagnoses that were inactivated after the 06/07/22 regulatory import   GERD (gastroesophageal reflux disease)    Hypercholesteremia    Hypertension    MENOPAUSE, SURGICAL 03/08/2004   Qualifier: Diagnosis of   By: Gladis FNP, Nykedtra       OAB (overactive bladder)    Rheumatoid arthritis [facility])     Patient Active Problem List   Diagnosis Date Noted   Vaccine counseling 11/28/2023   Arthritis of both hands 09/26/2023   Right Back pain, CVA tenderness 06/16/2023   Carpal tunnel syndrome on left 03/22/2023   Osteoarthritis of ankle and foot 05/03/2022   Type 2 diabetes mellitus with hyperglycemia, without long-term current use of insulin (HCC) 03/30/2021   Type 2 diabetes mellitus with obesity 03/30/2021   Rheumatoid factor positive 12/10/2016   Chronic arthralgias of knees and hips 12/10/2016   Allergic  rhinitis 10/16/2009   Constipation 03/11/2009   Essential hypertension 02/19/2009   GERD 02/19/2009    Past Surgical History:  Procedure Laterality Date   ABDOMINAL HYSTERECTOMY     ANKLE FRACTURE SURGERY Left    CARPAL TUNNEL RELEASE Left 08/17/2023   Procedure: CARPAL TUNNEL RELEASE;  Surgeon: Jerri Kay HERO, MD;  Location: [facility];  Service: Orthopedics;  Laterality: Left;    OB History   No obstetric history on file.      Home Medications    Prior to Admission medications  Medication Sig Start Date End Date Taking? Authorizing Provider  Cholecalciferol (VITAMIN D3) 50 MCG (2000 UT) capsule Take 2,000 Units by mouth daily.   Yes [provider]  empagliflozin  (JARDIANCE ) 25 MG TABS tablet Take 1 tablet (25 mg total) by mouth daily before breakfast. 02/27/24  Yes Tobie Gaines, DO  losartan -hydrochlorothiazide  (HYZAAR ) 50-12.5 MG tablet Take 1 tablet by mouth daily. 05/23/23  Yes Masters, Izetta, DO  metFORMIN  (GLUCOPHAGE ) 500 MG tablet Take 2 tablets (1,000 mg total) by mouth 2 (two) times daily with a meal. 08/22/23 08/21/24 Yes Patel, Amar, DO  methocarbamol  (ROBAXIN ) 500 MG tablet Take 1 tablet (500 mg total) by mouth 2 (two) times daily. 03/24/24  Yes Ball, Phala Schraeder  G, FNP  pantoprazole  (PROTONIX ) 40 MG tablet Take 1 tablet (40 mg total)  by mouth daily as needed. 11/28/23  Yes Tobie Gaines, DO  rosuvastatin  (CRESTOR ) 20 MG tablet Take 1 tablet (20 mg total) by mouth daily. 05/23/23  Yes Masters, Katie, DO  diclofenac  Sodium (VOLTAREN ) 1 % GEL Apply 4 grams topically 4 (four) times daily. 11/28/23   Tobie Gaines, DO  DULoxetine  (CYMBALTA ) 30 MG capsule Take 1 capsule (30 mg total) by mouth daily. 11/28/23 11/27/24  Tobie Gaines, DO  polyethylene glycol (MIRALAX  / GLYCOLAX ) 17 g packet Take 17 g by mouth daily.    [provider]  promethazine -dextromethorphan (PROMETHAZINE -DM) 6.25-15 MG/5ML syrup Take 5 mLs by mouth every 8 (eight) hours as needed for cough.  12/10/23   Teresa Almarie LABOR, PA-C    Family History Family History  Problem Relation Age of Onset   Hypertension Mother    Diabetes Mother    Cancer Father    Diabetes Brother    Colon cancer Neg Hx    Rectal cancer Neg Hx    Stomach cancer Neg Hx     Social History Social History[1]   Allergies   Patient has no known allergies.   Review of Systems Review of Systems  Per HPI  Physical Exam Triage Vital Signs ED Triage Vitals  Encounter Vitals Group     BP 03/24/24 0912 132/85     Girls Systolic BP Percentile --      Girls Diastolic BP Percentile --      Boys Systolic BP Percentile --      Boys Diastolic BP Percentile --      Pulse Rate 03/24/24 0912 64     Resp 03/24/24 0912 16     Temp 03/24/24 0912 98.6 F (37 C)     Temp Source 03/24/24 0912 Oral     SpO2 03/24/24 0912 96 %     Weight --      Height --      Head Circumference --      Peak Flow --      Pain Score 03/24/24 0910 8     Pain Loc --      Pain Education --      Exclude from Growth Chart --    No data found.  Updated Vital Signs BP 132/85 (BP Location: Left Arm)   Pulse 64   Temp 98.6 F (37 C) (Oral)   Resp 16   SpO2 96%   Visual Acuity Right Eye Distance:   Left Eye Distance:   Bilateral Distance:    Right Eye Near:   Left Eye Near:    Bilateral Near:     Physical Exam Vitals and nursing note reviewed.  Constitutional:      Appearance: Normal appearance.  HENT:     Head: Normocephalic and atraumatic.     Right Ear: External ear normal.     Left Ear: External ear normal.     Nose: Nose normal.     Mouth/Throat:     Mouth: Mucous membranes are moist.  Eyes:     Conjunctiva/sclera: Conjunctivae normal.  Cardiovascular:     Rate and Rhythm: Normal rate.  Musculoskeletal:        General: Tenderness present. No swelling or deformity.     Cervical back: No bony tenderness.     Thoracic back: No bony tenderness.     Lumbar back: No bony tenderness.     Comments:  Tenderness along the paraspinous muscles bilaterally from cervical to lumbar spine.  Pain reproducible with range of motion.  Bony spine without crepitus, deformity or injury.  Skin:    General: Skin is warm and dry.  Neurological:     General: No focal deficit present.     Mental Status: She is alert.  Psychiatric:        Mood and Affect: Mood normal.      UC Treatments / Results  Labs (all labs ordered are listed, but only abnormal results are displayed) Labs Reviewed - No data to display  EKG   Radiology No results found.  Procedures Procedures (including critical care time)  Medications Ordered in UC Medications - No data to display  Initial Impression / Assessment and Plan / UC Course  I have reviewed the triage vital signs and the nursing notes.  Pertinent labs & imaging results that were available during my care of the patient were reviewed by me and considered in my medical decision making (see chart for details).  Vitals and triage reviewed, patient is hemodynamically stable.  Appears to have muscular spinal tenderness that is reproducible with range of motion.  Atraumatic, imaging deferred.  Without numbness or tingling.  Will trial muscle relaxers, encourage stretching and warm compress.  Plan of care, follow-up care, and return precautions given, no questions at this time.     Final Clinical Impressions(s) / UC Diagnoses   Final diagnoses:  Back strain, initial encounter     Discharge Instructions      Your symptoms are consistent with muscle pain / strain  Take the muscle relaxer twice daily as needed to help with the pain, this medication can cause drowsiness Continue to heat and gently stretch the area to loosen, can do topical Voltaren  gel up to 4 times daily as well  Symptoms should improve over the next week, if no improvement or any changes return to clinic or follow-up with orthopedic     ED Prescriptions     Medication Sig Dispense  Auth. Provider   methocarbamol  (ROBAXIN ) 500 MG tablet Take 1 tablet (500 mg total) by mouth 2 (two) times daily. 20 tablet Ball, Mackynzie Woolford  G, FNP      PDMP not reviewed this encounter.     [1]  Social History Tobacco Use   Smoking status: Former    Types: Cigarettes   Smokeless tobacco: Never  Vaping Use   Vaping status: Never Used  Substance Use Topics   Alcohol use: Not Currently   Drug use: Not Currently    Types: Marijuana    Comment: very rare     Ball, Errol Ala  G, FNP 03/24/24 1000  "

## 2024-03-24 NOTE — ED Triage Notes (Signed)
 Pt states that she just started back working recently sh is having back pain X 3 days. She has been taking BC

## 2024-04-03 ENCOUNTER — Other Ambulatory Visit (HOSPITAL_COMMUNITY): Payer: Self-pay
